# Patient Record
Sex: Male | Born: 1965 | Race: Black or African American | Hispanic: No | Marital: Single | Smoking: Current some day smoker
Health system: Southern US, Community
[De-identification: ages and names within clinical notes are randomized; demographics above are authoritative.]

## PROBLEM LIST (undated history)

## (undated) DIAGNOSIS — T7840XA Allergy, unspecified, initial encounter: Secondary | ICD-10-CM

## (undated) DIAGNOSIS — F1010 Alcohol abuse, uncomplicated: Secondary | ICD-10-CM

## (undated) DIAGNOSIS — W3400XA Accidental discharge from unspecified firearms or gun, initial encounter: Secondary | ICD-10-CM

## (undated) DIAGNOSIS — M795 Residual foreign body in soft tissue: Secondary | ICD-10-CM

## (undated) DIAGNOSIS — K861 Other chronic pancreatitis: Secondary | ICD-10-CM

## (undated) DIAGNOSIS — Z5189 Encounter for other specified aftercare: Secondary | ICD-10-CM

## (undated) HISTORY — DX: Other chronic pancreatitis: K86.1

## (undated) HISTORY — DX: Alcohol abuse, uncomplicated: F10.10

## (undated) HISTORY — DX: Encounter for other specified aftercare: Z51.89

## (undated) HISTORY — PX: CHEST TUBE INSERTION: SHX231

## (undated) HISTORY — DX: Allergy, unspecified, initial encounter: T78.40XA

## (undated) HISTORY — DX: Residual foreign body in soft tissue: M79.5

## (undated) HISTORY — PX: OTHER SURGICAL HISTORY: SHX169

---

## 1992-05-29 HISTORY — PX: FRACTURE SURGERY: SHX138

## 2006-06-02 ENCOUNTER — Emergency Department (HOSPITAL_COMMUNITY): Admission: EM | Admit: 2006-06-02 | Discharge: 2006-06-02 | Payer: Self-pay | Admitting: Emergency Medicine

## 2006-08-21 ENCOUNTER — Emergency Department (HOSPITAL_COMMUNITY): Admission: EM | Admit: 2006-08-21 | Discharge: 2006-08-21 | Payer: Self-pay | Admitting: Emergency Medicine

## 2006-08-23 ENCOUNTER — Ambulatory Visit: Payer: Self-pay | Admitting: Family Medicine

## 2006-08-23 DIAGNOSIS — M25559 Pain in unspecified hip: Secondary | ICD-10-CM

## 2006-08-23 DIAGNOSIS — F17200 Nicotine dependence, unspecified, uncomplicated: Secondary | ICD-10-CM

## 2006-12-01 ENCOUNTER — Emergency Department (HOSPITAL_COMMUNITY): Admission: EM | Admit: 2006-12-01 | Discharge: 2006-12-01 | Payer: Self-pay | Admitting: Emergency Medicine

## 2007-05-01 ENCOUNTER — Ambulatory Visit: Payer: Self-pay | Admitting: Family Medicine

## 2007-05-01 ENCOUNTER — Encounter (INDEPENDENT_AMBULATORY_CARE_PROVIDER_SITE_OTHER): Payer: Self-pay | Admitting: *Deleted

## 2007-05-01 LAB — CONVERTED CEMR LAB
ALT: 30 units/L (ref 0–53)
AST: 25 units/L (ref 0–37)
BUN: 13 mg/dL (ref 6–23)
Cholesterol: 191 mg/dL (ref 0–200)
Creatinine, Ser: 0.98 mg/dL (ref 0.40–1.50)
HDL: 46 mg/dL (ref >39)
Total Bilirubin: 0.8 mg/dL (ref 0.3–1.2)

## 2007-05-31 ENCOUNTER — Ambulatory Visit: Payer: Self-pay | Admitting: Family Medicine

## 2007-05-31 ENCOUNTER — Encounter (INDEPENDENT_AMBULATORY_CARE_PROVIDER_SITE_OTHER): Payer: Self-pay | Admitting: *Deleted

## 2007-06-03 LAB — CONVERTED CEMR LAB
ALT: 47 units/L (ref 0–53)
Albumin: 4.3 g/dL (ref 3.5–5.2)
Alkaline Phosphatase: 41 units/L (ref 39–117)
Basophils Relative: 0 % (ref 0–1)
Glucose, Bld: 91 mg/dL (ref 70–99)
Lipase: 151 units/L — ABNORMAL HIGH (ref 0–75)
Lymphocytes Relative: 44 % (ref 12–46)
Lymphs Abs: 2.6 10*3/uL (ref 0.7–4.0)
MCHC: 32.5 g/dL (ref 30.0–36.0)
Monocytes Absolute: 0.7 10*3/uL (ref 0.1–1.0)
Monocytes Relative: 13 % — ABNORMAL HIGH (ref 3–12)
Neutrophils Relative %: 41 % — ABNORMAL LOW (ref 43–77)
Platelets: 270 10*3/uL (ref 150–400)
RBC: 5.33 M/uL (ref 4.22–5.81)
RDW: 14.4 % (ref 11.5–15.5)
Sodium: 141 meq/L (ref 135–145)
WBC: 5.9 10*3/uL (ref 4.0–10.5)

## 2007-06-05 ENCOUNTER — Telehealth (INDEPENDENT_AMBULATORY_CARE_PROVIDER_SITE_OTHER): Payer: Self-pay | Admitting: *Deleted

## 2007-06-06 ENCOUNTER — Encounter: Admission: RE | Admit: 2007-06-06 | Discharge: 2007-06-06 | Payer: Self-pay | Admitting: *Deleted

## 2007-06-12 ENCOUNTER — Encounter (INDEPENDENT_AMBULATORY_CARE_PROVIDER_SITE_OTHER): Payer: Self-pay | Admitting: *Deleted

## 2007-06-17 ENCOUNTER — Emergency Department (HOSPITAL_COMMUNITY): Admission: EM | Admit: 2007-06-17 | Discharge: 2007-06-17 | Payer: Self-pay | Admitting: Emergency Medicine

## 2007-12-16 ENCOUNTER — Emergency Department (HOSPITAL_COMMUNITY): Admission: EM | Admit: 2007-12-16 | Discharge: 2007-12-17 | Payer: Self-pay | Admitting: Emergency Medicine

## 2008-01-07 ENCOUNTER — Encounter: Payer: Self-pay | Admitting: Family Medicine

## 2008-05-23 ENCOUNTER — Ambulatory Visit: Payer: Self-pay | Admitting: Family Medicine

## 2008-05-23 ENCOUNTER — Inpatient Hospital Stay (HOSPITAL_COMMUNITY): Admission: EM | Admit: 2008-05-23 | Discharge: 2008-05-26 | Payer: Self-pay | Admitting: Emergency Medicine

## 2008-06-29 ENCOUNTER — Ambulatory Visit: Payer: Self-pay | Admitting: Family Medicine

## 2008-06-29 ENCOUNTER — Inpatient Hospital Stay (HOSPITAL_COMMUNITY): Admission: AD | Admit: 2008-06-29 | Discharge: 2008-07-01 | Payer: Self-pay | Admitting: Family Medicine

## 2008-06-29 ENCOUNTER — Encounter: Payer: Self-pay | Admitting: Emergency Medicine

## 2008-07-10 ENCOUNTER — Encounter: Payer: Self-pay | Admitting: *Deleted

## 2009-01-11 ENCOUNTER — Telehealth: Payer: Self-pay | Admitting: *Deleted

## 2009-05-03 ENCOUNTER — Emergency Department (HOSPITAL_COMMUNITY): Admission: EM | Admit: 2009-05-03 | Discharge: 2009-05-03 | Payer: Self-pay | Admitting: Internal Medicine

## 2009-06-29 ENCOUNTER — Ambulatory Visit: Payer: Self-pay | Admitting: Diagnostic Radiology

## 2009-06-29 ENCOUNTER — Emergency Department (HOSPITAL_BASED_OUTPATIENT_CLINIC_OR_DEPARTMENT_OTHER): Admission: EM | Admit: 2009-06-29 | Discharge: 2009-06-29 | Payer: Self-pay | Admitting: Emergency Medicine

## 2009-09-02 ENCOUNTER — Emergency Department (HOSPITAL_COMMUNITY): Admission: EM | Admit: 2009-09-02 | Discharge: 2009-09-02 | Payer: Self-pay | Admitting: Emergency Medicine

## 2009-09-06 ENCOUNTER — Emergency Department (HOSPITAL_COMMUNITY): Admission: EM | Admit: 2009-09-06 | Discharge: 2009-09-06 | Payer: Self-pay | Admitting: Emergency Medicine

## 2009-09-06 LAB — CONVERTED CEMR LAB
Alkaline Phosphatase: 41 units/L
BUN: 19 mg/dL
Chloride: 108 meq/L
Creatinine, Ser: 1.15 mg/dL
Sodium: 140 meq/L

## 2009-09-15 ENCOUNTER — Ambulatory Visit: Payer: Self-pay | Admitting: Family Medicine

## 2009-09-15 DIAGNOSIS — K439 Ventral hernia without obstruction or gangrene: Secondary | ICD-10-CM | POA: Insufficient documentation

## 2009-09-15 DIAGNOSIS — K219 Gastro-esophageal reflux disease without esophagitis: Secondary | ICD-10-CM | POA: Insufficient documentation

## 2009-09-15 DIAGNOSIS — F1910 Other psychoactive substance abuse, uncomplicated: Secondary | ICD-10-CM | POA: Insufficient documentation

## 2009-09-15 LAB — CONVERTED CEMR LAB
Hemoglobin: 13.7 g/dL
WBC: 5.8 10*3/uL

## 2009-09-16 ENCOUNTER — Encounter: Payer: Self-pay | Admitting: *Deleted

## 2010-06-28 NOTE — Letter (Signed)
Summary: Generic Letter  [facility]  [address]   [place], [place] [address]   Phone: [phone]  Fax: [phone]    09/16/2009  [name] [address] [place], [place]  [address]  Dear Mr. SCOGGINS,     I was unable to contact you by phone. Dr. Jeanice Lim has requested we schedule you an appointment with a surgeon. An appointment has been scheduled for Sep 28, 2009 at 9:45 AM with Dr. Carolynne Edouard of [facility]. The address is [address] [place], [place]. Phone number is [phone]. If this time is not convenient please call ther office  to reschedule.        Thank you.         Sincerely,   Theresia Lo RN  Appended Document: Generic Letter letter mailed.
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
## 2010-08-17 LAB — DIFFERENTIAL
Basophils Absolute: 0.1 10*3/uL (ref 0.0–0.1)
Basophils Relative: 0 % (ref 0–1)
Basophils Relative: 2 % — ABNORMAL HIGH (ref 0–1)
Eosinophils Relative: 0 % (ref 0–5)
Eosinophils Relative: 3 % (ref 0–5)
Monocytes Absolute: 0.9 10*3/uL (ref 0.1–1.0)
Monocytes Relative: 16 % — ABNORMAL HIGH (ref 3–12)
Neutro Abs: 2.3 10*3/uL (ref 1.7–7.7)
Neutrophils Relative %: 68 % (ref 43–77)

## 2010-08-17 LAB — CBC
HCT: 41.8 % (ref 39.0–52.0)
Hemoglobin: 14.1 g/dL (ref 13.0–17.0)
Platelets: 209 10*3/uL (ref 150–400)
Platelets: 212 10*3/uL (ref 150–400)
RBC: 4.76 MIL/uL (ref 4.22–5.81)
RDW: 13.9 % (ref 11.5–15.5)
WBC: 5.8 10*3/uL (ref 4.0–10.5)

## 2010-08-17 LAB — COMPREHENSIVE METABOLIC PANEL
ALT: 32 U/L (ref 0–53)
ALT: 39 U/L (ref 0–53)
AST: 30 U/L (ref 0–37)
Albumin: 3.9 g/dL (ref 3.5–5.2)
Alkaline Phosphatase: 41 U/L (ref 39–117)
Alkaline Phosphatase: 43 U/L (ref 39–117)
BUN: 18 mg/dL (ref 6–23)
BUN: 19 mg/dL (ref 6–23)
CO2: 22 mEq/L (ref 19–32)
Chloride: 102 mEq/L (ref 96–112)
Creatinine, Ser: 1.43 mg/dL (ref 0.4–1.5)
GFR calc Af Amer: >60 mL/min (ref >60)
GFR calc non Af Amer: 54 mL/min — ABNORMAL LOW (ref >60)
Glucose, Bld: 52 mg/dL — ABNORMAL LOW (ref 70–99)
Potassium: 3.8 mEq/L (ref 3.5–5.1)
Sodium: 140 mEq/L (ref 135–145)
Total Protein: 7.2 g/dL (ref 6.0–8.3)

## 2010-08-17 LAB — ETHANOL: Alcohol, Ethyl (B): 83 mg/dL — ABNORMAL HIGH (ref 0–10)

## 2010-08-17 LAB — RAPID STREP SCREEN (MED CTR MEBANE ONLY): Streptococcus, Group A Screen (Direct): NEGATIVE

## 2010-08-18 LAB — URINALYSIS, ROUTINE W REFLEX MICROSCOPIC
Leukocytes, UA: NEGATIVE
Nitrite: NEGATIVE
Specific Gravity, Urine: 1.017 (ref 1.005–1.030)
pH: 6 (ref 5.0–8.0)

## 2010-08-18 LAB — LIPASE, BLOOD: Lipase: 42 U/L (ref 23–300)

## 2010-08-18 LAB — CBC
MCHC: 34 g/dL (ref 30.0–36.0)
MCV: 87 fL (ref 78.0–100.0)
RBC: 4.78 MIL/uL (ref 4.22–5.81)

## 2010-08-18 LAB — DIFFERENTIAL
Lymphocytes Relative: 28 % (ref 12–46)
Lymphs Abs: 2.3 10*3/uL (ref 0.7–4.0)
Neutrophils Relative %: 54 % (ref 43–77)

## 2010-08-18 LAB — URINE MICROSCOPIC-ADD ON

## 2010-08-18 LAB — COMPREHENSIVE METABOLIC PANEL
CO2: 32 mEq/L (ref 19–32)
Calcium: 8.7 mg/dL (ref 8.4–10.5)
Creatinine, Ser: 1.2 mg/dL (ref 0.4–1.5)
GFR calc Af Amer: >60 mL/min (ref >60)
GFR calc non Af Amer: >60 mL/min (ref >60)
Glucose, Bld: 111 mg/dL — ABNORMAL HIGH (ref 70–99)

## 2010-09-13 LAB — HEPATIC FUNCTION PANEL
ALT: 26 U/L (ref 0–53)
AST: 39 U/L — ABNORMAL HIGH (ref 0–37)
Albumin: 2.8 g/dL — ABNORMAL LOW (ref 3.5–5.2)
Albumin: 4.1 g/dL (ref 3.5–5.2)
Alkaline Phosphatase: 50 U/L (ref 39–117)
Alkaline Phosphatase: 39 U/L (ref 39–117)
Total Protein: 5.2 g/dL — ABNORMAL LOW (ref 6.0–8.3)
Total Protein: 7.8 g/dL (ref 6.0–8.3)

## 2010-09-13 LAB — COMPREHENSIVE METABOLIC PANEL
Alkaline Phosphatase: 30 U/L — ABNORMAL LOW (ref 39–117)
BUN: 10 mg/dL (ref 6–23)
Calcium: 8 mg/dL — ABNORMAL LOW (ref 8.4–10.5)
Glucose, Bld: 106 mg/dL — ABNORMAL HIGH (ref 70–99)
Potassium: 3.3 mEq/L — ABNORMAL LOW (ref 3.5–5.1)
Total Protein: 5.4 g/dL — ABNORMAL LOW (ref 6.0–8.3)

## 2010-09-13 LAB — BASIC METABOLIC PANEL
BUN: 18 mg/dL (ref 6–23)
Chloride: 105 mEq/L (ref 96–112)
Chloride: 104 mEq/L (ref 96–112)
Creatinine, Ser: 1.17 mg/dL (ref 0.4–1.5)
GFR calc Af Amer: >60 mL/min (ref >60)
GFR calc Af Amer: >60 mL/min (ref >60)
GFR calc non Af Amer: >60 mL/min (ref >60)
Potassium: 3.9 mEq/L (ref 3.5–5.1)
Sodium: 140 mEq/L (ref 135–145)

## 2010-09-13 LAB — LIPASE, BLOOD
Lipase: 23 U/L (ref 11–59)
Lipase: 60 U/L — ABNORMAL HIGH (ref 11–59)

## 2010-09-13 LAB — DIFFERENTIAL
Basophils Absolute: 0 10*3/uL (ref 0.0–0.1)
Basophils Relative: 0 % (ref 0–1)
Eosinophils Relative: 0 % (ref 0–5)
Monocytes Absolute: 0.3 10*3/uL (ref 0.1–1.0)
Monocytes Relative: 3 % (ref 3–12)

## 2010-09-13 LAB — CBC
HCT: 46.4 % (ref 39.0–52.0)
Hemoglobin: 15.6 g/dL (ref 13.0–17.0)
MCHC: 33.5 g/dL (ref 30.0–36.0)
RBC: 5.25 MIL/uL (ref 4.22–5.81)
RDW: 15.1 % (ref 11.5–15.5)
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
## 2010-12-19 ENCOUNTER — Emergency Department (HOSPITAL_COMMUNITY): Payer: Self-pay

## 2010-12-19 ENCOUNTER — Encounter: Payer: Self-pay | Admitting: Family Medicine

## 2010-12-19 ENCOUNTER — Encounter (HOSPITAL_COMMUNITY): Payer: Self-pay | Admitting: Radiology

## 2010-12-19 ENCOUNTER — Inpatient Hospital Stay (HOSPITAL_COMMUNITY)
Admission: EM | Admit: 2010-12-19 | Discharge: 2010-12-21 | DRG: 439 | Disposition: A | Discharge status: Home / Self Care | Payer: Self-pay | Source: Ambulatory Visit | Attending: Family Medicine | Admitting: Family Medicine

## 2010-12-19 DIAGNOSIS — K219 Gastro-esophageal reflux disease without esophagitis: Secondary | ICD-10-CM | POA: Diagnosis present

## 2010-12-19 DIAGNOSIS — M6282 Rhabdomyolysis: Secondary | ICD-10-CM | POA: Diagnosis present

## 2010-12-19 DIAGNOSIS — F1920 Other psychoactive substance dependence, uncomplicated: Secondary | ICD-10-CM

## 2010-12-19 DIAGNOSIS — F17200 Nicotine dependence, unspecified, uncomplicated: Secondary | ICD-10-CM | POA: Diagnosis present

## 2010-12-19 DIAGNOSIS — F1910 Other psychoactive substance abuse, uncomplicated: Secondary | ICD-10-CM | POA: Diagnosis present

## 2010-12-19 DIAGNOSIS — R300 Dysuria: Secondary | ICD-10-CM | POA: Diagnosis present

## 2010-12-19 DIAGNOSIS — K859 Acute pancreatitis without necrosis or infection, unspecified: Secondary | ICD-10-CM

## 2010-12-19 MED ORDER — IOHEXOL 300 MG/ML  SOLN
100.0000 mL | Freq: Once | INTRAMUSCULAR | Status: AC | PRN
  Administered 2010-12-19: 100 mL via INTRAVENOUS
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
## 2010-12-20 ENCOUNTER — Encounter: Payer: Self-pay | Admitting: Family Medicine

## 2010-12-20 LAB — CBC
HCT: 40.1 % (ref 39.0–52.0)
Hemoglobin: 13.9 g/dL (ref 13.0–17.0)
Hemoglobin: 11.4 g/dL — ABNORMAL LOW (ref 13.0–17.0)
MCH: 30.1 pg (ref 26.0–34.0)
MCH: 29.6 pg (ref 26.0–34.0)
MCHC: 34.7 g/dL (ref 30.0–36.0)
MCHC: 34.1 g/dL (ref 30.0–36.0)
MCV: 86.8 fL (ref 78.0–100.0)
Platelets: 206 10*3/uL (ref 150–400)
RBC: 3.85 MIL/uL — ABNORMAL LOW (ref 4.22–5.81)
RDW: 14.1 % (ref 11.5–15.5)

## 2010-12-20 LAB — CARDIAC PANEL(CRET KIN+CKTOT+MB+TROPI)
CK, MB: 4.4 ng/mL — ABNORMAL HIGH (ref 0.3–4.0)
Troponin I: <0.3 ng/mL (ref <0.30)

## 2010-12-20 LAB — BASIC METABOLIC PANEL
CO2: 25 mEq/L (ref 19–32)
Calcium: 8.1 mg/dL — ABNORMAL LOW (ref 8.4–10.5)
Creatinine, Ser: 1.04 mg/dL (ref 0.50–1.35)
GFR calc non Af Amer: >60 mL/min (ref >60)
Glucose, Bld: 114 mg/dL — ABNORMAL HIGH (ref 70–99)
Sodium: 140 mEq/L (ref 135–145)

## 2010-12-20 LAB — COMPREHENSIVE METABOLIC PANEL
AST: 22 U/L (ref 0–37)
BUN: 16 mg/dL (ref 6–23)
CO2: 27 mEq/L (ref 19–32)
Calcium: 9 mg/dL (ref 8.4–10.5)
Chloride: 105 mEq/L (ref 96–112)
Creatinine, Ser: 1.06 mg/dL (ref 0.50–1.35)
GFR calc Af Amer: >60 mL/min (ref >60)
GFR calc non Af Amer: >60 mL/min (ref >60)
Glucose, Bld: 81 mg/dL (ref 70–99)
Total Bilirubin: 0.5 mg/dL (ref 0.3–1.2)

## 2010-12-20 LAB — DIFFERENTIAL
Basophils Absolute: 0.1 10*3/uL (ref 0.0–0.1)
Basophils Relative: 1 % (ref 0–1)
Eosinophils Relative: 2 % (ref 0–5)
Lymphocytes Relative: 20 % (ref 12–46)
Monocytes Absolute: 0.6 10*3/uL (ref 0.1–1.0)
Monocytes Relative: 7 % (ref 3–12)

## 2010-12-20 LAB — URINALYSIS, ROUTINE W REFLEX MICROSCOPIC
Ketones, ur: NEGATIVE mg/dL
Leukocytes, UA: NEGATIVE
Protein, ur: NEGATIVE mg/dL
Urobilinogen, UA: 1 mg/dL (ref 0.0–1.0)

## 2010-12-20 LAB — PROTIME-INR
INR: 0.89 (ref 0.00–1.49)
Prothrombin Time: 12.2 seconds (ref 11.6–15.2)

## 2010-12-20 LAB — RAPID URINE DRUG SCREEN, HOSP PERFORMED
Amphetamines: NOT DETECTED
Barbiturates: NOT DETECTED
Barbiturates: NOT DETECTED
Cocaine: POSITIVE — AB
Opiates: NOT DETECTED
Tetrahydrocannabinol: NOT DETECTED
Tetrahydrocannabinol: NOT DETECTED

## 2010-12-20 LAB — CK TOTAL AND CKMB (NOT AT ARMC): Total CK: 541 U/L — ABNORMAL HIGH (ref 7–232)

## 2010-12-20 LAB — LACTIC ACID, PLASMA: Lactic Acid, Venous: 0.7 mmol/L (ref 0.5–2.2)

## 2010-12-20 LAB — TROPONIN I: Troponin I: <0.3 ng/mL (ref <0.30)

## 2010-12-20 LAB — LIPASE, BLOOD: Lipase: 164 U/L — ABNORMAL HIGH (ref 11–59)
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
## 2010-12-21 LAB — CBC
HCT: 34.8 % — ABNORMAL LOW (ref 39.0–52.0)
Hemoglobin: 11.7 g/dL — ABNORMAL LOW (ref 13.0–17.0)
RBC: 4.04 MIL/uL — ABNORMAL LOW (ref 4.22–5.81)

## 2010-12-23 ENCOUNTER — Inpatient Hospital Stay: Payer: Self-pay | Admitting: Family Medicine
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
## 2011-02-16 LAB — URINALYSIS, ROUTINE W REFLEX MICROSCOPIC
Bilirubin Urine: NEGATIVE
Ketones, ur: NEGATIVE
Nitrite: NEGATIVE
Specific Gravity, Urine: 1.024
Urobilinogen, UA: 2 — ABNORMAL HIGH

## 2011-02-16 LAB — CBC
HCT: 41.6
Hemoglobin: 13.9
MCHC: 33.4
MCV: 87.1
RBC: 4.78
WBC: 7.8

## 2011-02-16 LAB — COMPREHENSIVE METABOLIC PANEL
AST: 42 — ABNORMAL HIGH
BUN: 13
CO2: 28
Calcium: 9.3
Chloride: 107
Creatinine, Ser: 1.2
GFR calc non Af Amer: >60
Glucose, Bld: 81
Total Bilirubin: 1.3 — ABNORMAL HIGH

## 2011-02-16 LAB — LIPASE, BLOOD: Lipase: 25

## 2011-02-16 LAB — DIFFERENTIAL
Basophils Absolute: 0.1
Eosinophils Relative: 2
Lymphocytes Relative: 31
Neutrophils Relative %: 56

## 2011-03-02 ENCOUNTER — Encounter: Payer: Self-pay | Admitting: Family Medicine

## 2011-03-03 LAB — COCAINE, URINE, CONFIRMATION: Benzoylecgonine GC/MS Conf: 290 ng/mL

## 2011-03-03 LAB — BASIC METABOLIC PANEL
BUN: 4 mg/dL — ABNORMAL LOW (ref 6–23)
Calcium: 8.6 mg/dL (ref 8.4–10.5)
Creatinine, Ser: 1.12 mg/dL (ref 0.4–1.5)
GFR calc non Af Amer: >60 mL/min (ref >60)
Glucose, Bld: 94 mg/dL (ref 70–99)

## 2011-03-03 LAB — COMPREHENSIVE METABOLIC PANEL
ALT: 15 U/L (ref 0–53)
AST: 24 U/L (ref 0–37)
AST: 31 U/L (ref 0–37)
Albumin: 2.9 g/dL — ABNORMAL LOW (ref 3.5–5.2)
Albumin: 3.7 g/dL (ref 3.5–5.2)
Alkaline Phosphatase: 34 U/L — ABNORMAL LOW (ref 39–117)
CO2: 25 mEq/L (ref 19–32)
Calcium: 8.1 mg/dL — ABNORMAL LOW (ref 8.4–10.5)
Chloride: 104 mEq/L (ref 96–112)
Chloride: 106 mEq/L (ref 96–112)
Creatinine, Ser: 1.15 mg/dL (ref 0.4–1.5)
GFR calc Af Amer: >60 mL/min (ref >60)
GFR calc Af Amer: >60 mL/min (ref >60)
Potassium: 4.3 mEq/L (ref 3.5–5.1)
Sodium: 138 mEq/L (ref 135–145)
Sodium: 139 mEq/L (ref 135–145)
Total Bilirubin: 1.3 mg/dL — ABNORMAL HIGH (ref 0.3–1.2)

## 2011-03-03 LAB — CBC
HCT: 37.3 % — ABNORMAL LOW (ref 39.0–52.0)
MCHC: 33.6 g/dL (ref 30.0–36.0)
MCV: 88.3 fL (ref 78.0–100.0)
Platelets: 263 10*3/uL (ref 150–400)
Platelets: 269 10*3/uL (ref 150–400)
Platelets: 325 10*3/uL (ref 150–400)
RBC: 4.22 MIL/uL (ref 4.22–5.81)
RDW: 13.9 % (ref 11.5–15.5)
WBC: 7.4 10*3/uL (ref 4.0–10.5)
WBC: 8.2 10*3/uL (ref 4.0–10.5)
WBC: 9.1 10*3/uL (ref 4.0–10.5)

## 2011-03-03 LAB — ETHANOL: Alcohol, Ethyl (B): <5 mg/dL (ref 0–10)

## 2011-03-03 LAB — URINE CULTURE: Colony Count: 1000

## 2011-03-03 LAB — POCT I-STAT, CHEM 8
BUN: 15 mg/dL (ref 6–23)
Calcium, Ion: 1.12 mmol/L (ref 1.12–1.32)
Hemoglobin: 16 g/dL (ref 13.0–17.0)
Sodium: 141 mEq/L (ref 135–145)
TCO2: 27 mmol/L (ref 0–100)

## 2011-03-03 LAB — DIFFERENTIAL
Basophils Absolute: 0 10*3/uL (ref 0.0–0.1)
Basophils Relative: 1 % (ref 0–1)
Eosinophils Relative: 1 % (ref 0–5)
Lymphocytes Relative: 24 % (ref 12–46)
Monocytes Absolute: 1 10*3/uL (ref 0.1–1.0)

## 2011-03-03 LAB — URINALYSIS, ROUTINE W REFLEX MICROSCOPIC
Bilirubin Urine: NEGATIVE
Hgb urine dipstick: NEGATIVE
Protein, ur: 30 mg/dL — AB
Urobilinogen, UA: 1 mg/dL (ref 0.0–1.0)

## 2011-03-03 LAB — LIPASE, BLOOD
Lipase: 116 U/L — ABNORMAL HIGH (ref 11–59)
Lipase: 32 U/L (ref 11–59)

## 2011-03-03 LAB — URINE DRUGS OF ABUSE SCREEN W ALC, ROUTINE (REF LAB)
Benzodiazepines.: NEGATIVE
Ethyl Alcohol: <10 mg/dL (ref <10)
Methadone: NEGATIVE
Opiate Screen, Urine: NEGATIVE
Phencyclidine (PCP): NEGATIVE
Propoxyphene: NEGATIVE

## 2011-03-03 LAB — LIPID PANEL
LDL Cholesterol: 90 mg/dL (ref 0–99)
Total CHOL/HDL Ratio: 3.3 RATIO
Triglycerides: 89 mg/dL (ref <150)
VLDL: 18 mg/dL (ref 0–40)

## 2011-03-03 LAB — PROTIME-INR: INR: 1 (ref 0.00–1.49)

## 2011-03-29 ENCOUNTER — Encounter: Payer: Self-pay | Admitting: Family Medicine

## 2011-03-29 ENCOUNTER — Ambulatory Visit (INDEPENDENT_AMBULATORY_CARE_PROVIDER_SITE_OTHER): Payer: Medicaid Other | Admitting: Family Medicine

## 2011-03-29 VITALS — BP 111/68 | HR 77 | Temp 98.8°F | Ht 67.5 in | Wt 142.0 lb

## 2011-03-29 DIAGNOSIS — K439 Ventral hernia without obstruction or gangrene: Secondary | ICD-10-CM

## 2011-03-29 DIAGNOSIS — Z23 Encounter for immunization: Secondary | ICD-10-CM

## 2011-03-29 DIAGNOSIS — Z0000 Encounter for general adult medical examination without abnormal findings: Secondary | ICD-10-CM | POA: Insufficient documentation

## 2011-03-29 DIAGNOSIS — K0889 Other specified disorders of teeth and supporting structures: Secondary | ICD-10-CM

## 2011-03-29 DIAGNOSIS — K089 Disorder of teeth and supporting structures, unspecified: Secondary | ICD-10-CM

## 2011-03-29 MED ORDER — PENICILLIN V POTASSIUM 250 MG PO TABS: 250.0000 mg | ORAL_TABLET | Freq: Four times a day (QID) | ORAL | Status: DC

## 2011-03-29 NOTE — Assessment & Plan Note (Addendum)
Due to dental cavity. Gave information on [employer] [facility] and will contact social worker for information on dentist in the area that except Medicaid. Started penicillin for inflammation reduction. I feel is inappropriate for prescription pain medications as he has a significant history for cocaine abuse and would not be safe with this medication Followup in one month.

## 2011-03-29 NOTE — Progress Notes (Signed)
Mr. Jilda Roche presents to clinic today for a CPE. He also notes that he has left upper jaw tooth pain, with a dental caries.  1) health maintenance: Reviewed. Flu shot due today, tetanus given in July last hospitalization. 2) history of substance abuse: Intermittent cocaine user and continued tobacco user. He is interested in quitting smoking. 3) past surgical and past medical history is reviewed. 4) disability paperwork initiated  ROS as above otherwise neg Medications reviewed.  Exam:  BP 111/68  Pulse 77  Temp 98.8 F (37.1 C)  Ht 5' 7.5" (1.715 m)  Wt 142 lb (64.411 kg)  BMI 21.91 kg/m2 Gen: Well NAD HEENT: EOMI,  MMM, dental carie in upper left rear molar Lungs: CTABL Nl WOB Heart: RRR no MRG Abd: NABS, NT, ND, multiple scars on abdomen small reducible ventral hernia. Exts: Non edematous BL  LE, warm and well perfused.

## 2011-03-29 NOTE — Assessment & Plan Note (Signed)
Recommended following up with his general surgeon. He may benefit from a revision.

## 2011-03-29 NOTE — Assessment & Plan Note (Signed)
As noted above. Multiple issues addressed and reviewed. Tooth pain and ventral hernia discussed in separate issues. Plan follow up in one month. Offered counseling for substance abuse and will follow up with dedicated tobacco abuse cessation visit.Marland Kitchen

## 2011-03-29 NOTE — Patient Instructions (Addendum)
Thank you for coming in today. Please call [facility] at [phone] to get the details to get your tooth pulled.  My social worker will also call you about dentists that take medicaid in town. Make an appointment with your surgeon to take a look at your belly.  Take the penicillin 4x daily to reduce the inflammation around your tooth.  See me in 1 month.

## 2011-03-31 ENCOUNTER — Telehealth: Payer: Self-pay | Admitting: Clinical

## 2011-03-31 NOTE — Telephone Encounter (Signed)
Social Worker received referral to assist pt. In finding a Medicaid Dentist. Social Worker contacted the number listed in the system however pt. Mother answered the phone and stated she was not with pt. Social Worker briefly informed who she was and what the referral was for. The pt. Mother stated she would assist patient in finding a Medicaid Dentist and would call Social Worker if additional resources were needed.

## 2011-04-29 ENCOUNTER — Emergency Department (HOSPITAL_COMMUNITY): Payer: Medicaid Other

## 2011-04-29 ENCOUNTER — Encounter (HOSPITAL_COMMUNITY): Payer: Self-pay | Admitting: Emergency Medicine

## 2011-04-29 ENCOUNTER — Observation Stay (HOSPITAL_COMMUNITY)
Admission: EM | Admit: 2011-04-29 | Discharge: 2011-05-02 | Disposition: A | Discharge status: Home / Self Care | Payer: Medicaid Other | Source: Ambulatory Visit | Attending: Family Medicine | Admitting: Family Medicine

## 2011-04-29 DIAGNOSIS — R112 Nausea with vomiting, unspecified: Secondary | ICD-10-CM | POA: Diagnosis present

## 2011-04-29 DIAGNOSIS — R109 Unspecified abdominal pain: Secondary | ICD-10-CM | POA: Insufficient documentation

## 2011-04-29 DIAGNOSIS — K861 Other chronic pancreatitis: Secondary | ICD-10-CM | POA: Insufficient documentation

## 2011-04-29 DIAGNOSIS — K219 Gastro-esophageal reflux disease without esophagitis: Secondary | ICD-10-CM | POA: Diagnosis present

## 2011-04-29 DIAGNOSIS — R071 Chest pain on breathing: Secondary | ICD-10-CM | POA: Insufficient documentation

## 2011-04-29 DIAGNOSIS — G8929 Other chronic pain: Secondary | ICD-10-CM

## 2011-04-29 DIAGNOSIS — F1910 Other psychoactive substance abuse, uncomplicated: Secondary | ICD-10-CM

## 2011-04-29 DIAGNOSIS — K921 Melena: Secondary | ICD-10-CM | POA: Insufficient documentation

## 2011-04-29 DIAGNOSIS — J111 Influenza due to unidentified influenza virus with other respiratory manifestations: Principal | ICD-10-CM | POA: Insufficient documentation

## 2011-04-29 DIAGNOSIS — Z96649 Presence of unspecified artificial hip joint: Secondary | ICD-10-CM | POA: Insufficient documentation

## 2011-04-29 DIAGNOSIS — K439 Ventral hernia without obstruction or gangrene: Secondary | ICD-10-CM | POA: Insufficient documentation

## 2011-04-29 DIAGNOSIS — K920 Hematemesis: Secondary | ICD-10-CM | POA: Insufficient documentation

## 2011-04-29 DIAGNOSIS — R51 Headache: Secondary | ICD-10-CM | POA: Insufficient documentation

## 2011-04-29 DIAGNOSIS — R509 Fever, unspecified: Secondary | ICD-10-CM

## 2011-04-29 DIAGNOSIS — K469 Unspecified abdominal hernia without obstruction or gangrene: Secondary | ICD-10-CM

## 2011-04-29 DIAGNOSIS — R05 Cough: Secondary | ICD-10-CM | POA: Diagnosis present

## 2011-04-29 DIAGNOSIS — M25559 Pain in unspecified hip: Secondary | ICD-10-CM | POA: Diagnosis present

## 2011-04-29 LAB — CBC
MCV: 87.9 fL (ref 78.0–100.0)
Platelets: 283 10*3/uL (ref 150–400)
RDW: 13.6 % (ref 11.5–15.5)
WBC: 7.6 10*3/uL (ref 4.0–10.5)

## 2011-04-29 LAB — COMPREHENSIVE METABOLIC PANEL
ALT: 16 U/L (ref 0–53)
AST: 32 U/L (ref 0–37)
CO2: 25 mEq/L (ref 19–32)
Calcium: 9.4 mg/dL (ref 8.4–10.5)
Potassium: 4 mEq/L (ref 3.5–5.1)
Sodium: 135 mEq/L (ref 135–145)
Total Protein: 7.6 g/dL (ref 6.0–8.3)

## 2011-04-29 LAB — URINE MICROSCOPIC-ADD ON

## 2011-04-29 LAB — DIFFERENTIAL
Basophils Absolute: 0 10*3/uL (ref 0.0–0.1)
Eosinophils Absolute: 0 10*3/uL (ref 0.0–0.7)
Eosinophils Relative: 0 % (ref 0–5)
Lymphocytes Relative: 11 % — ABNORMAL LOW (ref 12–46)
Neutrophils Relative %: 75 % (ref 43–77)

## 2011-04-29 LAB — PROTIME-INR
INR: 1.01 (ref 0.00–1.49)
Prothrombin Time: 13.5 seconds (ref 11.6–15.2)

## 2011-04-29 LAB — URINALYSIS, ROUTINE W REFLEX MICROSCOPIC
Glucose, UA: NEGATIVE mg/dL
Specific Gravity, Urine: 1.029 (ref 1.005–1.030)

## 2011-04-29 LAB — LACTIC ACID, PLASMA: Lactic Acid, Venous: 1.3 mmol/L (ref 0.5–2.2)

## 2011-04-29 LAB — ETHANOL: Alcohol, Ethyl (B): <11 mg/dL (ref 0–11)

## 2011-04-29 LAB — APTT: aPTT: 29 seconds (ref 24–37)

## 2011-04-29 MED ORDER — HYDROMORPHONE HCL PF 1 MG/ML IJ SOLN
1.0000 mg | Freq: Once | INTRAMUSCULAR | Status: AC
  Administered 2011-04-29: 1 mg via INTRAVENOUS
  Filled 2011-04-29: qty 1

## 2011-04-29 MED ORDER — SODIUM CHLORIDE 0.9 % IV BOLUS (SEPSIS)
1000.0000 mL | Freq: Once | INTRAVENOUS | Status: AC
  Administered 2011-04-29: 1000 mL via INTRAVENOUS

## 2011-04-29 MED ORDER — ONDANSETRON HCL 4 MG/2ML IJ SOLN
4.0000 mg | Freq: Once | INTRAMUSCULAR | Status: AC
  Administered 2011-04-29: 4 mg via INTRAVENOUS
  Filled 2011-04-29: qty 2

## 2011-04-29 MED ORDER — ACETAMINOPHEN 325 MG PO TABS
650.0000 mg | ORAL_TABLET | Freq: Once | ORAL | Status: AC
  Administered 2011-04-29 – 2011-04-30 (×2): 650 mg via ORAL
  Filled 2011-04-29: qty 2

## 2011-04-29 MED ORDER — IOHEXOL 300 MG/ML  SOLN: 100.0000 mL | Freq: Once | INTRAMUSCULAR | Status: AC | PRN

## 2011-04-29 NOTE — ED Notes (Signed)
Pt reports abdominal pain with N/V onset yesterday. Pt reports vomiting dark red blood.
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
## 2011-04-29 NOTE — ED Notes (Signed)
Pt states he can't void urinal at bedside

## 2011-04-30 ENCOUNTER — Encounter (HOSPITAL_COMMUNITY): Payer: Self-pay | Admitting: General Practice

## 2011-04-30 ENCOUNTER — Emergency Department (HOSPITAL_COMMUNITY): Payer: Medicaid Other

## 2011-04-30 DIAGNOSIS — R05 Cough: Secondary | ICD-10-CM | POA: Diagnosis present

## 2011-04-30 DIAGNOSIS — R112 Nausea with vomiting, unspecified: Secondary | ICD-10-CM | POA: Diagnosis present

## 2011-04-30 LAB — BASIC METABOLIC PANEL
BUN: 11 mg/dL (ref 6–23)
CO2: 25 mEq/L (ref 19–32)
Calcium: 7.9 mg/dL — ABNORMAL LOW (ref 8.4–10.5)
GFR calc non Af Amer: 67 mL/min — ABNORMAL LOW (ref >90)
Glucose, Bld: 97 mg/dL (ref 70–99)
Sodium: 132 mEq/L — ABNORMAL LOW (ref 135–145)

## 2011-04-30 LAB — INFLUENZA PANEL BY PCR (TYPE A & B)
H1N1 flu by pcr: NOT DETECTED
Influenza B By PCR: NEGATIVE

## 2011-04-30 LAB — CBC
HCT: 35.9 % — ABNORMAL LOW (ref 39.0–52.0)
Hemoglobin: 12.1 g/dL — ABNORMAL LOW (ref 13.0–17.0)
MCH: 29.4 pg (ref 26.0–34.0)
MCHC: 33.7 g/dL (ref 30.0–36.0)
RBC: 4.11 MIL/uL — ABNORMAL LOW (ref 4.22–5.81)

## 2011-04-30 LAB — RAPID URINE DRUG SCREEN, HOSP PERFORMED
Cocaine: POSITIVE — AB
Opiates: NOT DETECTED

## 2011-04-30 MED ORDER — GUAIFENESIN ER 600 MG PO TB12
600.0000 mg | ORAL_TABLET | Freq: Two times a day (BID) | ORAL | Status: DC
  Administered 2011-04-30 – 2011-05-02 (×5): 600 mg via ORAL
  Filled 2011-04-30 (×7): qty 1

## 2011-04-30 MED ORDER — HYDROMORPHONE HCL PF 1 MG/ML IJ SOLN
1.0000 mg | Freq: Once | INTRAMUSCULAR | Status: AC
  Administered 2011-04-30: 1 mg via INTRAVENOUS
  Filled 2011-04-30: qty 1

## 2011-04-30 MED ORDER — SODIUM CHLORIDE 0.9 % IJ SOLN
3.0000 mL | Freq: Two times a day (BID) | INTRAMUSCULAR | Status: DC
  Administered 2011-04-30 – 2011-05-02 (×4): 3 mL via INTRAVENOUS

## 2011-04-30 MED ORDER — PANTOPRAZOLE SODIUM 40 MG IV SOLR
40.0000 mg | Freq: Every day | INTRAVENOUS | Status: DC
  Administered 2011-04-30: 40 mg via INTRAVENOUS
  Filled 2011-04-30 (×2): qty 40

## 2011-04-30 MED ORDER — HYDROMORPHONE HCL PF 1 MG/ML IJ SOLN
1.0000 mg | INTRAMUSCULAR | Status: DC | PRN
  Administered 2011-04-30 (×2): 1 mg via INTRAVENOUS
  Filled 2011-04-30 (×2): qty 1

## 2011-04-30 MED ORDER — ONDANSETRON HCL 4 MG PO TABS: 4.0000 mg | ORAL_TABLET | Freq: Four times a day (QID) | ORAL | Status: DC | PRN

## 2011-04-30 MED ORDER — ACETAMINOPHEN 650 MG RE SUPP: 650.0000 mg | Freq: Four times a day (QID) | RECTAL | Status: DC | PRN

## 2011-04-30 MED ORDER — ACETAMINOPHEN 325 MG PO TABS
ORAL_TABLET | ORAL | Status: AC
  Administered 2011-04-30: 650 mg via ORAL
  Filled 2011-04-30: qty 2

## 2011-04-30 MED ORDER — OSELTAMIVIR PHOSPHATE 75 MG PO CAPS
75.0000 mg | ORAL_CAPSULE | Freq: Two times a day (BID) | ORAL | Status: DC
  Administered 2011-04-30 – 2011-05-02 (×5): 75 mg via ORAL
  Filled 2011-04-30 (×7): qty 1

## 2011-04-30 MED ORDER — IOHEXOL 300 MG/ML  SOLN: 100.0000 mL | Freq: Once | INTRAMUSCULAR | Status: AC | PRN

## 2011-04-30 MED ORDER — SODIUM CHLORIDE 0.45 % IV SOLN
INTRAVENOUS | Status: DC
  Administered 2011-04-30 – 2011-05-01 (×4): via INTRAVENOUS

## 2011-04-30 MED ORDER — HYDROCODONE-ACETAMINOPHEN 5-325 MG PO TABS
1.0000 | ORAL_TABLET | ORAL | Status: DC | PRN
  Administered 2011-04-30 – 2011-05-02 (×7): 2 via ORAL
  Filled 2011-04-30 (×7): qty 2

## 2011-04-30 MED ORDER — BENZONATATE 100 MG PO CAPS
100.0000 mg | ORAL_CAPSULE | Freq: Three times a day (TID) | ORAL | Status: DC | PRN
  Administered 2011-04-30 – 2011-05-01 (×2): 100 mg via ORAL
  Filled 2011-04-30: qty 1

## 2011-04-30 MED ORDER — ONDANSETRON HCL 4 MG PO TABS: 4.0000 mg | ORAL_TABLET | Freq: Four times a day (QID) | ORAL | Status: DC

## 2011-04-30 MED ORDER — IOHEXOL 300 MG/ML  SOLN
100.0000 mL | Freq: Once | INTRAMUSCULAR | Status: AC | PRN
  Administered 2011-04-30: 100 mL via INTRAVENOUS

## 2011-04-30 MED ORDER — ACETAMINOPHEN 325 MG PO TABS: 650.0000 mg | ORAL_TABLET | Freq: Four times a day (QID) | ORAL | Status: DC | PRN

## 2011-04-30 MED ORDER — OXYCODONE-ACETAMINOPHEN 5-325 MG PO TABS: 2.0000 | ORAL_TABLET | ORAL | Status: DC | PRN

## 2011-04-30 MED ORDER — SODIUM CHLORIDE 0.9 % IV SOLN: 250.0000 mL | INTRAVENOUS | Status: DC | PRN

## 2011-04-30 MED ORDER — HEPARIN SODIUM (PORCINE) 5000 UNIT/ML IJ SOLN
5000.0000 [IU] | Freq: Three times a day (TID) | INTRAMUSCULAR | Status: DC
  Administered 2011-04-30 – 2011-05-02 (×8): 5000 [IU] via SUBCUTANEOUS
  Filled 2011-04-30 (×11): qty 1

## 2011-04-30 MED ORDER — ONDANSETRON HCL 4 MG/2ML IJ SOLN
4.0000 mg | Freq: Once | INTRAMUSCULAR | Status: AC
  Administered 2011-04-30: 4 mg via INTRAVENOUS
  Filled 2011-04-30: qty 2

## 2011-04-30 MED ORDER — ONDANSETRON HCL 4 MG/2ML IJ SOLN: 4.0000 mg | Freq: Four times a day (QID) | INTRAMUSCULAR | Status: DC | PRN

## 2011-04-30 MED ORDER — SODIUM CHLORIDE 0.9 % IJ SOLN: 3.0000 mL | INTRAMUSCULAR | Status: DC | PRN

## 2011-04-30 NOTE — Progress Notes (Signed)
[facility] Attending Daily Note: Jisselle Poth MD [phone] pager office [phone] I  have seen and examined this patient, reviewed their chart. I have discussed this patient with the resident. I agree with the resident's findings, assessment and care plan. 

## 2011-04-30 NOTE — Progress Notes (Signed)
MD, Please call mother Dorene Grebe after assessing patient for update on condition. Cell is [phone], office is [phone]. Peter Congo RN

## 2011-04-30 NOTE — ED Provider Notes (Signed)
Medical screening examination/treatment/procedure(s) were performed by non-physician practitioner and as supervising physician I was immediately available for consultation/collaboration.   Glynn Octave, MD 04/30/11 1011

## 2011-04-30 NOTE — Progress Notes (Signed)
Patient is requesting a prescription for a nicotine patch at discharge. Peter Congo RN
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
## 2011-04-30 NOTE — H&P (Signed)
[facility] Attending Admission Note: Mkayla Steele MD [phone] pager office [phone] I  have seen and examined this patient, reviewed their chart. I have discussed this patient with the resident. I agree with the resident's findings, assessment and care plan. 
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
## 2011-05-01 MED ORDER — PANTOPRAZOLE SODIUM 40 MG PO TBEC
40.0000 mg | DELAYED_RELEASE_TABLET | Freq: Every day | ORAL | Status: DC
  Administered 2011-05-01 – 2011-05-02 (×2): 40 mg via ORAL
  Filled 2011-05-01 (×3): qty 1

## 2011-05-01 MED ORDER — PANTOPRAZOLE SODIUM 40 MG PO TBEC: 40.0000 mg | DELAYED_RELEASE_TABLET | Freq: Every day | ORAL | Status: DC

## 2011-05-01 MED ORDER — OSELTAMIVIR PHOSPHATE 75 MG PO CAPS: 75.0000 mg | ORAL_CAPSULE | Freq: Two times a day (BID) | ORAL | Status: DC

## 2011-05-01 MED ORDER — ENSURE CLINICAL ST REVIGOR PO LIQD
237.0000 mL | Freq: Three times a day (TID) | ORAL | Status: DC
  Administered 2011-05-01 – 2011-05-02 (×3): 237 mL via ORAL

## 2011-05-01 MED ORDER — BENZONATATE 100 MG PO CAPS: 100.0000 mg | ORAL_CAPSULE | Freq: Three times a day (TID) | ORAL | Status: AC | PRN

## 2011-05-01 NOTE — Progress Notes (Signed)
INITIAL ADULT NUTRITION ASSESSMENT Date: 05/01/2011   Time: 12:04 PM  Reason for Assessment: Consult  ASSESSMENT: Male 45 y.o.  Dx: Productive cough  Hx:  Past Medical History  Diagnosis Date  . Pancreatitis chronic     Related Meds:     . guaiFENesin  600 mg Oral BID  . heparin  5,000 Units Subcutaneous Q8H  . oseltamivir  75 mg Oral BID  . pantoprazole (PROTONIX) IV  40 mg Intravenous QHS  . sodium chloride  3 mL Intravenous Q12H    Ht: 5\' 7"  (170.2 cm)  Wt: 135 lb (61.236 kg)  Ideal Wt: 67.2 kg % Ideal Wt: 91%  Usual Wt: 68 kg % Usual Wt: 89%  Body mass index is 21.14 kg/(m^2).  Food/Nutrition Related Hx: unintentional weight loss greater than 10 lbs within the last month per nutrition screen  Labs:  BMET    Component Value Date/Time   NA 132* 04/30/2011 0926   K 3.4* 04/30/2011 0926   CL 101 04/30/2011 0926   CO2 25 04/30/2011 0926   GLUCOSE 97 04/30/2011 0926   BUN 11 04/30/2011 0926   CREATININE 1.27 04/30/2011 0926   CALCIUM 7.9* 04/30/2011 0926   GFRNONAA 67* 04/30/2011 0926   GFRAA 78* 04/30/2011 0926    I/O last 3 completed shifts: In: 2468.2 [P.O.:729; I.V.:1729.2; IV Piggyback:10] Out: 1 [Urine:1]     Diet Order: Regular  Supplements/Tube Feeding: N/A  IVF:    sodium chloride Last Rate: 125 mL/hr at 05/01/11 0533    Estimated Nutritional Needs:   Kcal: 1,800-2,000 Protein: 90-100 g Fluid: 1.8-2.0 L  RD obtained nutrition hx from pt -- reports poor appetite PTA; suspects unintentional weight loss in the past month-- unable to quantify amount, however UBW ~ 150-160 lbs; meets criteria for severe malnutrition; PO intake 70-85% per flowsheet records; denies N/V; pt amenable to Strawberry Ensure supplement -- RD to order.  NUTRITION DIAGNOSIS: -Malnutrition (NI-5.2).  Status: Ongoing  RELATED TO: acute illness  AS EVIDENCE BY: 10% weight loss, < 75% intake of estimated energy requirement for > 1  month  MONITORING/EVALUATION(Goals): Goal: meet >90% of estimated nutrition needs to prevent further weight loss Monitor: PO intake, weight, labs  EDUCATION NEEDS: -No education needs identified at this time  INTERVENTION:  Add Strawberry Ensure Clinical Strength PO TID (350 kcals, 13 gm per 8 fl oz)  RD to follow for nutrition care plan  Dietitian #: [phone]  DOCUMENTATION CODES Per approved criteria  -Severe malnutrition in the context of acute illness or injury    Alger Memos 05/01/2011, 12:04 PM

## 2011-05-01 NOTE — Progress Notes (Signed)
Clinical Child psychotherapist (CSW) completed psychosocial assessment which can be found in pt shadow chart. Pt. States he uses cocaine when he is with his friends, does not believe it will be difficult to quit, reports prior treatment and denies any guilt or concern with drug use. CSW provided pt with substance abuse resources and CSW is signing off. Theresia Bough, MSW, Theresia Majors [phone]
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
## 2011-05-02 MED ORDER — GUAIFENESIN ER 600 MG PO TB12: 600.0000 mg | ORAL_TABLET | Freq: Two times a day (BID) | ORAL | Status: DC

## 2011-05-02 MED ORDER — OSELTAMIVIR PHOSPHATE 75 MG PO CAPS: 75.0000 mg | ORAL_CAPSULE | Freq: Two times a day (BID) | ORAL | Status: AC

## 2011-05-02 NOTE — Discharge Summary (Signed)
I discussed with Dr Konrad Dolores and agree with this discharge note.

## 2011-05-02 NOTE — Progress Notes (Signed)
Family Medicine Teaching Service Attending Note  I discussed patient [name]  with Dr. Konrad Dolores and reviewed their note for today.  I agree with their assessment and plan.     Ok to DC
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
## 2011-05-02 NOTE — Progress Notes (Signed)
Subjective: Patient with minimal periumbilical pain this am. Feels much better this a.m. Patient tolerating regular diet without nausea or vomiting. Patient feels ready to go home. Patient reports significant improvement in cough after use of Tessalon Perles. And overall pulmonary congestion with Mucinex.    Denies - CP, SOB, nausea, vomiting, syncope  Objective: Vital signs in last 24 hours: Temp:  [98.4 F (36.9 C)-98.7 F (37.1 C)] 98.6 F (37 C) (12/04 0500) Pulse Rate:  [63-70] 69  (12/04 0500) Resp:  [18-20] 20  (12/04 0500) BP: (101-111)/(59-76) 101/59 mmHg (12/04 0500) SpO2:  [98 %-99 %] 98 % (12/04 0500) Weight change:  Last BM Date: 04/30/11  Intake/Output from previous day: 12/03 0701 - 12/04 0700 In: 420 [P.O.:420] Out: 700 [Urine:700] Intake/Output this shift:    General appearance: alert, cooperative, appears stated age and no distress Resp: clear to auscultation bilaterally Cardio: regular rate and rhythm, S1, S2 normal, no murmur, click, rub or gallop GI: Soft, non-distended.  Small umblical hernia noted, difficult to reduce. Mild tenderness to palpation noted.  No guarding or rebound.  Surgical scars noted Extremities: extremities normal, atraumatic, no cyanosis or edema  Lab Results: none  Studies/Results: None  Medications:  Scheduled:    . feeding supplement  237 mL Oral TID WC  . guaiFENesin  600 mg Oral BID  . heparin  5,000 Units Subcutaneous Q8H  . oseltamivir  75 mg Oral BID  . pantoprazole  40 mg Oral Q1200  . sodium chloride  3 mL Intravenous Q12H  . DISCONTD: pantoprazole  40 mg Oral Q1200  . DISCONTD: pantoprazole (PROTONIX) IV  40 mg Intravenous QHS    Assessment/Plan:  45 year old male presents with 6 days of productive cough, Flu positive on Tamiflu   1.  Cough and Fever:  Flu positive. On day 3 of tamiflu. CXR on 04/29/11, and CBC reasuring that this is not bacterial pneumonia. Will continue w/ tessalon pearls for cough as these  helped pt significantly  2. Abdominal pain: Minimal this am. Negative CT abd/xrays.  Possibly viral gastroenteritis.   3. FEN/GI: tolerating regular diet.   SLIV  4. Cocaine abuse. The patient denies chest pain outside of coughing. Patient was counseled on dangers of use patient voiced understanding.   5. DVT prophylaxis heparin 5000 units subcutaneous 3 times a day.   6. Disposition: To home pending clinical improvement.    LOS: 3 days   Shelly Flatten, MD 05/02/2011, 7:28 AM Service Pager:  [phone]

## 2011-05-06 LAB — CULTURE, BLOOD (ROUTINE X 2)
Culture  Setup Time: 201212021104
Culture: NO GROWTH

## 2011-05-15 ENCOUNTER — Inpatient Hospital Stay: Payer: Medicaid Other | Admitting: Family Medicine

## 2011-06-22 ENCOUNTER — Encounter (HOSPITAL_COMMUNITY): Payer: Self-pay | Admitting: Emergency Medicine

## 2011-06-22 ENCOUNTER — Emergency Department (INDEPENDENT_AMBULATORY_CARE_PROVIDER_SITE_OTHER)
Admission: EM | Admit: 2011-06-22 | Discharge: 2011-06-22 | Disposition: A | Discharge status: Home / Self Care | Payer: Self-pay | Source: Home / Self Care | Attending: Emergency Medicine | Admitting: Emergency Medicine

## 2011-06-22 DIAGNOSIS — K0889 Other specified disorders of teeth and supporting structures: Secondary | ICD-10-CM

## 2011-06-22 DIAGNOSIS — K089 Disorder of teeth and supporting structures, unspecified: Secondary | ICD-10-CM

## 2011-06-22 HISTORY — DX: Accidental discharge from unspecified firearms or gun, initial encounter: W34.00XA

## 2011-06-22 MED ORDER — HYDROCODONE-ACETAMINOPHEN 5-325 MG PO TABS: ORAL_TABLET | ORAL | Status: AC

## 2011-06-22 MED ORDER — LIDOCAINE VISCOUS 2 % MT SOLN: 10.0000 mL | OROMUCOSAL | Status: AC | PRN

## 2011-06-22 MED ORDER — CHLORHEXIDINE GLUCONATE 0.12 % MT SOLN: OROMUCOSAL | Status: AC

## 2011-06-22 MED ORDER — IBUPROFEN 600 MG PO TABS: 600.0000 mg | ORAL_TABLET | Freq: Four times a day (QID) | ORAL | Status: AC | PRN

## 2011-06-22 MED ORDER — LIDOCAINE VISCOUS 2 % MT SOLN
20.0000 mL | Freq: Once | OROMUCOSAL | Status: AC
  Administered 2011-06-22: 20 mL via OROMUCOSAL

## 2011-06-22 MED ORDER — PENICILLIN V POTASSIUM 500 MG PO TABS: 500.0000 mg | ORAL_TABLET | Freq: Four times a day (QID) | ORAL | Status: AC
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
## 2011-06-22 NOTE — ED Notes (Signed)
Patient c/o toothache.  C/o pain in the back of mouth on the left, reports top and bottom tooth pain.  Patient reports chipping tooth yesterday on the bottom.  Reports cold air painful.

## 2011-07-17 ENCOUNTER — Encounter (HOSPITAL_COMMUNITY): Payer: Self-pay | Admitting: *Deleted

## 2011-07-17 ENCOUNTER — Observation Stay (HOSPITAL_COMMUNITY)
Admission: EM | Admit: 2011-07-17 | Discharge: 2011-07-18 | Disposition: A | Discharge status: Home / Self Care | Payer: Medicaid Other | Source: Ambulatory Visit | Attending: Family Medicine | Admitting: Family Medicine

## 2011-07-17 DIAGNOSIS — K219 Gastro-esophageal reflux disease without esophagitis: Secondary | ICD-10-CM | POA: Insufficient documentation

## 2011-07-17 DIAGNOSIS — R1011 Right upper quadrant pain: Secondary | ICD-10-CM | POA: Insufficient documentation

## 2011-07-17 DIAGNOSIS — R300 Dysuria: Secondary | ICD-10-CM | POA: Insufficient documentation

## 2011-07-17 DIAGNOSIS — F1410 Cocaine abuse, uncomplicated: Secondary | ICD-10-CM | POA: Insufficient documentation

## 2011-07-17 DIAGNOSIS — R1115 Cyclical vomiting syndrome unrelated to migraine: Secondary | ICD-10-CM

## 2011-07-17 DIAGNOSIS — F1020 Alcohol dependence, uncomplicated: Secondary | ICD-10-CM | POA: Insufficient documentation

## 2011-07-17 DIAGNOSIS — R1012 Left upper quadrant pain: Principal | ICD-10-CM | POA: Insufficient documentation

## 2011-07-17 DIAGNOSIS — F17200 Nicotine dependence, unspecified, uncomplicated: Secondary | ICD-10-CM | POA: Insufficient documentation

## 2011-07-17 DIAGNOSIS — Z96649 Presence of unspecified artificial hip joint: Secondary | ICD-10-CM | POA: Insufficient documentation

## 2011-07-17 DIAGNOSIS — K2921 Alcoholic gastritis with bleeding: Secondary | ICD-10-CM | POA: Insufficient documentation

## 2011-07-17 DIAGNOSIS — F1910 Other psychoactive substance abuse, uncomplicated: Secondary | ICD-10-CM | POA: Diagnosis present

## 2011-07-17 DIAGNOSIS — K920 Hematemesis: Secondary | ICD-10-CM | POA: Insufficient documentation

## 2011-07-17 DIAGNOSIS — K861 Other chronic pancreatitis: Secondary | ICD-10-CM | POA: Insufficient documentation

## 2011-07-17 DIAGNOSIS — R109 Unspecified abdominal pain: Secondary | ICD-10-CM

## 2011-07-17 LAB — CBC
MCH: 28.8 pg (ref 26.0–34.0)
MCHC: 34.6 g/dL (ref 30.0–36.0)
MCHC: 32.6 g/dL (ref 30.0–36.0)
MCV: 87.4 fL (ref 78.0–100.0)
Platelets: 280 10*3/uL (ref 150–400)
Platelets: 258 10*3/uL (ref 150–400)
RDW: 14.4 % (ref 11.5–15.5)
RDW: 14.4 % (ref 11.5–15.5)
WBC: 5.6 10*3/uL (ref 4.0–10.5)

## 2011-07-17 LAB — DIFFERENTIAL
Basophils Absolute: 0.1 10*3/uL (ref 0.0–0.1)
Basophils Relative: 1 % (ref 0–1)
Eosinophils Absolute: 0.2 10*3/uL (ref 0.0–0.7)
Eosinophils Relative: 4 % (ref 0–5)
Lymphocytes Relative: 37 % (ref 12–46)

## 2011-07-17 LAB — COMPREHENSIVE METABOLIC PANEL
Albumin: 3.8 g/dL (ref 3.5–5.2)
Alkaline Phosphatase: 54 U/L (ref 39–117)
BUN: 16 mg/dL (ref 6–23)
Chloride: 105 mEq/L (ref 96–112)
GFR calc Af Amer: >90 mL/min (ref >90)
Glucose, Bld: 69 mg/dL — ABNORMAL LOW (ref 70–99)
Potassium: 4.3 mEq/L (ref 3.5–5.1)
Total Bilirubin: 0.5 mg/dL (ref 0.3–1.2)

## 2011-07-17 LAB — HIV ANTIBODY (ROUTINE TESTING W REFLEX): HIV: NONREACTIVE

## 2011-07-17 LAB — CREATININE, SERUM: Creatinine, Ser: 1.11 mg/dL (ref 0.50–1.35)

## 2011-07-17 LAB — RPR: RPR Ser Ql: NONREACTIVE

## 2011-07-17 LAB — URINALYSIS, ROUTINE W REFLEX MICROSCOPIC
Bilirubin Urine: NEGATIVE
Glucose, UA: NEGATIVE mg/dL
Ketones, ur: NEGATIVE mg/dL
Protein, ur: NEGATIVE mg/dL

## 2011-07-17 LAB — LIPASE, BLOOD: Lipase: 37 U/L (ref 11–59)

## 2011-07-17 LAB — LACTIC ACID, PLASMA: Lactic Acid, Venous: 0.5 mmol/L (ref 0.5–2.2)

## 2011-07-17 LAB — URINE MICROSCOPIC-ADD ON

## 2011-07-17 MED ORDER — LORAZEPAM 2 MG/ML IJ SOLN: 1.0000 mg | Freq: Four times a day (QID) | INTRAMUSCULAR | Status: DC | PRN

## 2011-07-17 MED ORDER — SODIUM CHLORIDE 0.9 % IV SOLN
Freq: Once | INTRAVENOUS | Status: AC
  Administered 2011-07-17: 1000 mL via INTRAVENOUS

## 2011-07-17 MED ORDER — OXYCODONE-ACETAMINOPHEN 5-325 MG PO TABS
2.0000 | ORAL_TABLET | Freq: Once | ORAL | Status: AC
  Administered 2011-07-17: 2 via ORAL
  Filled 2011-07-17: qty 2

## 2011-07-17 MED ORDER — ACETAMINOPHEN 325 MG PO TABS: 650.0000 mg | ORAL_TABLET | Freq: Four times a day (QID) | ORAL | Status: DC | PRN

## 2011-07-17 MED ORDER — LORAZEPAM 1 MG PO TABS: 1.0000 mg | ORAL_TABLET | Freq: Four times a day (QID) | ORAL | Status: DC | PRN

## 2011-07-17 MED ORDER — ADULT MULTIVITAMIN W/MINERALS CH: 1.0000 | ORAL_TABLET | Freq: Every day | ORAL | Status: DC

## 2011-07-17 MED ORDER — ALUM & MAG HYDROXIDE-SIMETH 200-200-20 MG/5ML PO SUSP: 30.0000 mL | Freq: Four times a day (QID) | ORAL | Status: DC | PRN

## 2011-07-17 MED ORDER — ONDANSETRON HCL 4 MG/2ML IJ SOLN
4.0000 mg | Freq: Once | INTRAMUSCULAR | Status: AC
  Administered 2011-07-17: 4 mg via INTRAVENOUS
  Filled 2011-07-17: qty 2

## 2011-07-17 MED ORDER — HYDROCODONE-ACETAMINOPHEN 5-325 MG PO TABS
1.0000 | ORAL_TABLET | ORAL | Status: DC | PRN
  Administered 2011-07-18: 2 via ORAL
  Filled 2011-07-17: qty 2

## 2011-07-17 MED ORDER — ONDANSETRON HCL 4 MG/2ML IJ SOLN: 4.0000 mg | Freq: Four times a day (QID) | INTRAMUSCULAR | Status: DC | PRN

## 2011-07-17 MED ORDER — FOLIC ACID 1 MG PO TABS
1.0000 mg | ORAL_TABLET | Freq: Every day | ORAL | Status: DC
  Administered 2011-07-18: 1 mg via ORAL
  Filled 2011-07-17: qty 1

## 2011-07-17 MED ORDER — THIAMINE HCL 100 MG/ML IJ SOLN
Freq: Once | INTRAVENOUS | Status: AC
  Administered 2011-07-17: 20:00:00 via INTRAVENOUS
  Filled 2011-07-17 (×2): qty 1000

## 2011-07-17 MED ORDER — HYDROMORPHONE HCL PF 1 MG/ML IJ SOLN
1.0000 mg | Freq: Once | INTRAMUSCULAR | Status: AC
  Administered 2011-07-17: 1 mg via INTRAVENOUS
  Filled 2011-07-17: qty 1

## 2011-07-17 MED ORDER — ACETAMINOPHEN 650 MG RE SUPP: 650.0000 mg | Freq: Four times a day (QID) | RECTAL | Status: DC | PRN

## 2011-07-17 MED ORDER — THIAMINE HCL 100 MG/ML IJ SOLN
100.0000 mg | Freq: Every day | INTRAMUSCULAR | Status: DC
  Filled 2011-07-17: qty 1

## 2011-07-17 MED ORDER — NICOTINE 14 MG/24HR TD PT24
14.0000 mg | MEDICATED_PATCH | Freq: Every day | TRANSDERMAL | Status: DC
  Administered 2011-07-17 – 2011-07-18 (×2): 14 mg via TRANSDERMAL
  Filled 2011-07-17 (×3): qty 1

## 2011-07-17 MED ORDER — ONDANSETRON HCL 4 MG PO TABS: 4.0000 mg | ORAL_TABLET | Freq: Four times a day (QID) | ORAL | Status: DC | PRN

## 2011-07-17 MED ORDER — DOCUSATE SODIUM 100 MG PO CAPS
100.0000 mg | ORAL_CAPSULE | Freq: Two times a day (BID) | ORAL | Status: DC
  Administered 2011-07-17: 100 mg via ORAL
  Filled 2011-07-17 (×2): qty 1

## 2011-07-17 MED ORDER — GI COCKTAIL ~~LOC~~
30.0000 mL | Freq: Once | ORAL | Status: AC
  Administered 2011-07-17: 30 mL via ORAL
  Filled 2011-07-17: qty 30

## 2011-07-17 MED ORDER — ENOXAPARIN SODIUM 40 MG/0.4ML ~~LOC~~ SOLN
40.0000 mg | SUBCUTANEOUS | Status: DC
  Administered 2011-07-17: 40 mg via SUBCUTANEOUS
  Filled 2011-07-17 (×2): qty 0.4

## 2011-07-17 MED ORDER — FAMOTIDINE IN NACL 20-0.9 MG/50ML-% IV SOLN
20.0000 mg | Freq: Once | INTRAVENOUS | Status: AC
  Administered 2011-07-17: 20 mg via INTRAVENOUS
  Filled 2011-07-17: qty 50

## 2011-07-17 MED ORDER — VITAMIN B-1 100 MG PO TABS
100.0000 mg | ORAL_TABLET | Freq: Every day | ORAL | Status: DC
  Administered 2011-07-18: 100 mg via ORAL
  Filled 2011-07-17: qty 1

## 2011-07-17 MED ORDER — ADULT MULTIVITAMIN W/MINERALS CH
1.0000 | ORAL_TABLET | Freq: Every day | ORAL | Status: DC
  Administered 2011-07-18: 1 via ORAL
  Filled 2011-07-17: qty 1

## 2011-07-17 MED ORDER — DEXTROSE-NACL 5-0.45 % IV SOLN
INTRAVENOUS | Status: DC
  Administered 2011-07-17 – 2011-07-18 (×2): via INTRAVENOUS

## 2011-07-17 MED ORDER — MORPHINE SULFATE 2 MG/ML IJ SOLN
2.0000 mg | INTRAMUSCULAR | Status: DC | PRN
  Administered 2011-07-17 – 2011-07-18 (×6): 2 mg via INTRAVENOUS
  Filled 2011-07-17 (×6): qty 1
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
## 2011-07-17 NOTE — ED Notes (Signed)
Report called to Marylene Land, RN on 5100. Patient being transported to 5118.

## 2011-07-17 NOTE — ED Notes (Signed)
Family Practice at bedside.

## 2011-07-17 NOTE — ED Notes (Signed)
C/o abd pain, onset yesterday, worse this am, has been drinking ETOH, "thinks it is r/t an ulcer", h/o pancreatitis, h/o same pain. Took advil 2d ago, took 6 yesterday. Pt moaning & doubled over.
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
## 2011-07-17 NOTE — ED Notes (Signed)
Patient remains in pain and trying to sleep.

## 2011-07-17 NOTE — ED Notes (Signed)
Patient resting with less pain at this time.

## 2011-07-17 NOTE — ED Notes (Signed)
Patient remains in pain and asking for pain medication.
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
## 2011-07-17 NOTE — ED Notes (Signed)
Warm blankets given.

## 2011-07-17 NOTE — H&P (Signed)
Family Medicine Teaching Service Attending Note  I interviewed and examined patient [name] and reviewed their tests and x-rays.  I discussed with Dr. Rivka Safer and reviewed their note for today.  I agree with their assessment and plan.     Additionally  Currently in no acute distress sitting in bed.  Mildly tender in epigastric area around this hernia but this is easily reduced.  No guarding or rebound.  Most consistent with chronic pancreatitis flare. No signs of focal infection or obstruction. Advance diet and wean pain medications.  Agree with no CT unless worsens

## 2011-07-18 DIAGNOSIS — R109 Unspecified abdominal pain: Secondary | ICD-10-CM

## 2011-07-18 LAB — COMPREHENSIVE METABOLIC PANEL
ALT: 11 U/L (ref 0–53)
BUN: 15 mg/dL (ref 6–23)
CO2: 27 mEq/L (ref 19–32)
Calcium: 8.7 mg/dL (ref 8.4–10.5)
Creatinine, Ser: 1.2 mg/dL (ref 0.50–1.35)
GFR calc Af Amer: 84 mL/min — ABNORMAL LOW (ref >90)
GFR calc non Af Amer: 72 mL/min — ABNORMAL LOW (ref >90)
Glucose, Bld: 68 mg/dL — ABNORMAL LOW (ref 70–99)
Sodium: 142 mEq/L (ref 135–145)

## 2011-07-18 LAB — URINE DRUGS OF ABUSE SCREEN W ALC, ROUTINE (REF LAB)
Amphetamine Screen, Ur: NEGATIVE
Cocaine Metabolites: POSITIVE — AB
Creatinine,U: 178.8 mg/dL
Marijuana Metabolite: NEGATIVE
Opiate Screen, Urine: POSITIVE — AB

## 2011-07-18 LAB — GC/CHLAMYDIA PROBE AMP, URINE: Chlamydia, Swab/Urine, PCR: NEGATIVE

## 2011-07-18 MED ORDER — HYDROCODONE-ACETAMINOPHEN 5-325 MG PO TABS: 1.0000 | ORAL_TABLET | ORAL | Status: AC | PRN

## 2011-07-18 MED ORDER — HYDROCODONE-ACETAMINOPHEN 5-325 MG PO TABS: 1.0000 | ORAL_TABLET | ORAL | Status: DC | PRN

## 2011-07-18 NOTE — Progress Notes (Signed)
Pt given dc instructions. Rn went over medications and incision/wound care. Pt verbalized understanding and had no questions regarding care of instructions.  

## 2011-07-18 NOTE — Discharge Instructions (Signed)
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 This is an inflammation of the lining of the stomach. It is caused by drinking alcohol. The symptoms may include: burning abdominal pain, nausea, vomiting or even vomiting blood. It can be made worse by a poor diet. People who drink frequently often do not eat well. Taking aspirin or other anti-inflammatory medications increases stomach irritation and bleeding. These medicines should be avoided. °Treatment is aimed at the cause. You have to stop drinking alcohol if you want to get better. Eat a healthy, well-balanced diet. You may take liquid antacids as needed. Your caregiver may prescribe medications to help heal the stomach lining. Take these as prescribed. °PREVENTION  °· Anyone who has experienced alcoholic gastritis should consider that alcoholism may be an issue. Professional evaluation is highly recommended.  °· Although some people can recover without help, most need assistance. With treatment and support, many are able to stop drinking and rebuild their lives. Long-term recovery is possible.  °· Alcohol Addiction cannot be cured, but it can be treated successfully. Treatment centers are listed in telephone listings under:  °· Alcoholism and Addiction Treatment; Substance Abuse Treatment or Cocaine, Narcotics and Alcoholics Anonymous. Most hospitals and clinics can refer you to a specialized care center.  °· The [place] government maintains a toll-free number for treatment referrals: 1-800-662-4357 or 1-800-487-4889 (TDD). They also maintain a website: http://findtreatment.samhsa.gov. Other websites for more information are: www.mentalhealth.samhsa.gov and www.nida.gov.  °· In [place], treatment resources are listed in each province. Listings are available under:  °· [employer] or similar titles.  °SEEK IMMEDIATE MEDICAL CARE IF:  °· You develop severe abdominal pain, uncontrolled vomiting, or vomiting blood.  °· You blackout or have  fainting spells.  °· You develop seizures (this could be life threatening).  °· You develop bloody stools or stools that appear black or tarry.  °Document Released: 06/22/2004 Document Revised: 01/25/2011 Document Reviewed: 05/19/2009 °ExitCare® Patient Information ©2012 [employer]. °

## 2011-07-18 NOTE — Discharge Summary (Signed)
I have reviewed this discharge summary and agree.    
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
## 2011-07-21 LAB — COCAINE, URINE, CONFIRMATION: Benzoylecgonine GC/MS Conf: 44737 NG/ML — ABNORMAL HIGH

## 2011-07-21 LAB — BARBITURATE, URINE, CONFIRMATION
Butalbital UR Quant: NEGATIVE NG/ML
Pentobarbital GC/MS Conf: NEGATIVE NG/ML
Phenobarbital GC/MS Conf: 550 NG/ML — ABNORMAL HIGH

## 2011-07-21 LAB — OPIATE, QUANTITATIVE, URINE
Hydrocodone: NEGATIVE NG/ML
Morphine, Confirm: 1938 NG/ML — ABNORMAL HIGH
Oxycodone, ur: 614 NG/ML — ABNORMAL HIGH
Oxymorphone: NEGATIVE NG/ML

## 2011-11-27 ENCOUNTER — Emergency Department (HOSPITAL_COMMUNITY): Payer: Medicaid Other

## 2011-11-27 ENCOUNTER — Encounter (HOSPITAL_COMMUNITY): Payer: Self-pay | Admitting: Emergency Medicine

## 2011-11-27 ENCOUNTER — Observation Stay (HOSPITAL_COMMUNITY)
Admission: EM | Admit: 2011-11-27 | Discharge: 2011-11-28 | Disposition: A | Discharge status: Home / Self Care | Payer: Medicaid Other | Source: Ambulatory Visit | Attending: Family Medicine | Admitting: Family Medicine

## 2011-11-27 ENCOUNTER — Observation Stay (HOSPITAL_COMMUNITY): Payer: Medicaid Other

## 2011-11-27 DIAGNOSIS — R109 Unspecified abdominal pain: Secondary | ICD-10-CM

## 2011-11-27 DIAGNOSIS — K436 Other and unspecified ventral hernia with obstruction, without gangrene: Secondary | ICD-10-CM

## 2011-11-27 DIAGNOSIS — R112 Nausea with vomiting, unspecified: Secondary | ICD-10-CM | POA: Insufficient documentation

## 2011-11-27 DIAGNOSIS — K439 Ventral hernia without obstruction or gangrene: Principal | ICD-10-CM | POA: Insufficient documentation

## 2011-11-27 DIAGNOSIS — F1410 Cocaine abuse, uncomplicated: Secondary | ICD-10-CM

## 2011-11-27 DIAGNOSIS — K122 Cellulitis and abscess of mouth: Secondary | ICD-10-CM

## 2011-11-27 DIAGNOSIS — Z96659 Presence of unspecified artificial knee joint: Secondary | ICD-10-CM | POA: Insufficient documentation

## 2011-11-27 DIAGNOSIS — F1910 Other psychoactive substance abuse, uncomplicated: Secondary | ICD-10-CM | POA: Insufficient documentation

## 2011-11-27 DIAGNOSIS — K044 Acute apical periodontitis of pulpal origin: Secondary | ICD-10-CM | POA: Insufficient documentation

## 2011-11-27 LAB — CBC WITH DIFFERENTIAL/PLATELET
Basophils Relative: 1 % (ref 0–1)
Eosinophils Absolute: 0.2 10*3/uL (ref 0.0–0.7)
HCT: 39.8 % (ref 39.0–52.0)
Hemoglobin: 13.5 g/dL (ref 13.0–17.0)
Lymphs Abs: 2.3 10*3/uL (ref 0.7–4.0)
MCH: 29.5 pg (ref 26.0–34.0)
MCHC: 33.9 g/dL (ref 30.0–36.0)
Monocytes Absolute: 0.7 10*3/uL (ref 0.1–1.0)
Monocytes Relative: 8 % (ref 3–12)
Neutrophils Relative %: 62 % (ref 43–77)
RBC: 4.58 MIL/uL (ref 4.22–5.81)

## 2011-11-27 LAB — COMPREHENSIVE METABOLIC PANEL
ALT: 17 U/L (ref 0–53)
AST: 28 U/L (ref 0–37)
Albumin: 3.7 g/dL (ref 3.5–5.2)
Alkaline Phosphatase: 54 U/L (ref 39–117)
Calcium: 9.3 mg/dL (ref 8.4–10.5)
Potassium: 3.8 mEq/L (ref 3.5–5.1)
Sodium: 142 mEq/L (ref 135–145)
Total Protein: 7.6 g/dL (ref 6.0–8.3)

## 2011-11-27 LAB — RAPID URINE DRUG SCREEN, HOSP PERFORMED
Barbiturates: NOT DETECTED
Tetrahydrocannabinol: NOT DETECTED

## 2011-11-27 LAB — CBC
MCH: 29.9 pg (ref 26.0–34.0)
MCHC: 34.4 g/dL (ref 30.0–36.0)
MCV: 86.8 fL (ref 78.0–100.0)
Platelets: 233 10*3/uL (ref 150–400)
RBC: 4.25 MIL/uL (ref 4.22–5.81)

## 2011-11-27 LAB — URINE MICROSCOPIC-ADD ON

## 2011-11-27 LAB — URINALYSIS, ROUTINE W REFLEX MICROSCOPIC
Bilirubin Urine: NEGATIVE
Glucose, UA: NEGATIVE mg/dL
Specific Gravity, Urine: 1.013 (ref 1.005–1.030)
Urobilinogen, UA: 0.2 mg/dL (ref 0.0–1.0)

## 2011-11-27 LAB — OCCULT BLOOD, POC DEVICE: Fecal Occult Bld: NEGATIVE

## 2011-11-27 LAB — POCT I-STAT TROPONIN I: Troponin i, poc: 0 ng/mL (ref 0.00–0.08)

## 2011-11-27 MED ORDER — ACETAMINOPHEN 325 MG PO TABS
650.0000 mg | ORAL_TABLET | Freq: Four times a day (QID) | ORAL | Status: DC | PRN
  Administered 2011-11-28: 650 mg via ORAL
  Filled 2011-11-27: qty 2

## 2011-11-27 MED ORDER — ACETAMINOPHEN 650 MG RE SUPP: 650.0000 mg | Freq: Four times a day (QID) | RECTAL | Status: DC | PRN

## 2011-11-27 MED ORDER — NICOTINE 14 MG/24HR TD PT24
14.0000 mg | MEDICATED_PATCH | Freq: Every day | TRANSDERMAL | Status: DC | PRN
  Filled 2011-11-27: qty 1

## 2011-11-27 MED ORDER — IOHEXOL 300 MG/ML  SOLN
100.0000 mL | Freq: Once | INTRAMUSCULAR | Status: AC | PRN
  Administered 2011-11-27: 100 mL via INTRAVENOUS

## 2011-11-27 MED ORDER — MORPHINE SULFATE 4 MG/ML IJ SOLN
4.0000 mg | Freq: Once | INTRAMUSCULAR | Status: AC
  Administered 2011-11-27: 4 mg via INTRAVENOUS
  Filled 2011-11-27: qty 1

## 2011-11-27 MED ORDER — NYSTATIN 100000 UNIT/ML MT SUSP
5.0000 mL | Freq: Four times a day (QID) | OROMUCOSAL | Status: DC
  Administered 2011-11-27 – 2011-11-28 (×5): 500000 [IU] via ORAL
  Filled 2011-11-27 (×8): qty 5

## 2011-11-27 MED ORDER — MORPHINE SULFATE 4 MG/ML IJ SOLN
4.0000 mg | Freq: Once | INTRAMUSCULAR | Status: AC
  Administered 2011-11-27: 4 mg via INTRAVENOUS

## 2011-11-27 MED ORDER — SODIUM CHLORIDE 0.9 % IV BOLUS (SEPSIS)
1000.0000 mL | Freq: Once | INTRAVENOUS | Status: AC
  Administered 2011-11-27: 1000 mL via INTRAVENOUS

## 2011-11-27 MED ORDER — ONDANSETRON HCL 4 MG/2ML IJ SOLN: 4.0000 mg | Freq: Four times a day (QID) | INTRAMUSCULAR | Status: DC | PRN

## 2011-11-27 MED ORDER — ONDANSETRON HCL 4 MG/2ML IJ SOLN
4.0000 mg | Freq: Once | INTRAMUSCULAR | Status: AC
  Administered 2011-11-27: 4 mg via INTRAVENOUS
  Filled 2011-11-27: qty 2

## 2011-11-27 MED ORDER — ONDANSETRON 4 MG PO TBDP
ORAL_TABLET | ORAL | Status: AC
  Filled 2011-11-27: qty 2

## 2011-11-27 MED ORDER — HEPARIN SODIUM (PORCINE) 5000 UNIT/ML IJ SOLN
5000.0000 [IU] | Freq: Three times a day (TID) | INTRAMUSCULAR | Status: DC
  Administered 2011-11-27 – 2011-11-28 (×3): 5000 [IU] via SUBCUTANEOUS
  Filled 2011-11-27 (×5): qty 1

## 2011-11-27 MED ORDER — DIAZEPAM 5 MG/ML IJ SOLN
5.0000 mg | Freq: Once | INTRAMUSCULAR | Status: AC
  Administered 2011-11-27: 5 mg via INTRAVENOUS
  Filled 2011-11-27: qty 2

## 2011-11-27 MED ORDER — IOHEXOL 300 MG/ML  SOLN
20.0000 mL | INTRAMUSCULAR | Status: AC
  Administered 2011-11-27 (×2): 20 mL via ORAL

## 2011-11-27 MED ORDER — MORPHINE SULFATE 4 MG/ML IJ SOLN
8.0000 mg | Freq: Once | INTRAMUSCULAR | Status: AC
  Administered 2011-11-27: 8 mg via INTRAVENOUS
  Filled 2011-11-27: qty 2

## 2011-11-27 MED ORDER — ONDANSETRON HCL 4 MG PO TABS: 4.0000 mg | ORAL_TABLET | Freq: Four times a day (QID) | ORAL | Status: DC | PRN

## 2011-11-27 MED ORDER — MORPHINE SULFATE 4 MG/ML IJ SOLN
2.0000 mg | INTRAMUSCULAR | Status: DC | PRN
  Administered 2011-11-27 – 2011-11-28 (×6): 2 mg via INTRAVENOUS
  Filled 2011-11-27 (×6): qty 1

## 2011-11-27 MED ORDER — SODIUM CHLORIDE 0.9 % IV SOLN
INTRAVENOUS | Status: DC
  Administered 2011-11-27: 125 mL/h via INTRAVENOUS
  Administered 2011-11-27 – 2011-11-28 (×2): via INTRAVENOUS

## 2011-11-27 MED ORDER — MORPHINE SULFATE 4 MG/ML IJ SOLN
4.0000 mg | Freq: Once | INTRAMUSCULAR | Status: DC
  Filled 2011-11-27: qty 2
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
## 2011-11-27 NOTE — ED Notes (Signed)
Report received, assumed care.  

## 2011-11-27 NOTE — ED Notes (Signed)
Pt finished CT contrast, CT notified.  
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
## 2011-11-27 NOTE — ED Notes (Addendum)
Per EMS  abdominal pain  for two days, GEN, tender when you touch every any where, pt stated he has ulcer, also tooth pain left bottom for  One day.

## 2011-11-27 NOTE — ED Provider Notes (Signed)
Pt with abdominal pain, reports recent ETOH use Also reports rectal bleeding - rectal exam negative for blood, neg hemoccult (chaperone present) Workup pending at this time Pt seen with PA Retta Diones, MD 11/27/11 [phone]
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
## 2011-11-27 NOTE — Consult Note (Signed)
Very small ventral hernia/incisional hernia, chronic, with only small amount of partially reducible fat/omentum.  This is not a surgical emergency, and can be referred out for elective consideration.  Marta Lamas. Gae Bon, MD, FACS [phone] [facility]

## 2011-11-28 LAB — COMPREHENSIVE METABOLIC PANEL
ALT: 12 U/L (ref 0–53)
Alkaline Phosphatase: 38 U/L — ABNORMAL LOW (ref 39–117)
BUN: 9 mg/dL (ref 6–23)
CO2: 24 mEq/L (ref 19–32)
Calcium: 8.4 mg/dL (ref 8.4–10.5)
GFR calc Af Amer: 83 mL/min — ABNORMAL LOW (ref >90)
GFR calc non Af Amer: 71 mL/min — ABNORMAL LOW (ref >90)
Glucose, Bld: 89 mg/dL (ref 70–99)
Potassium: 3.6 mEq/L (ref 3.5–5.1)
Sodium: 138 mEq/L (ref 135–145)

## 2011-11-28 LAB — URINALYSIS, ROUTINE W REFLEX MICROSCOPIC
Glucose, UA: NEGATIVE mg/dL
Ketones, ur: NEGATIVE mg/dL
Leukocytes, UA: NEGATIVE
Nitrite: NEGATIVE
Protein, ur: NEGATIVE mg/dL
pH: 5.5 (ref 5.0–8.0)

## 2011-11-28 LAB — CBC
MCHC: 33.4 g/dL (ref 30.0–36.0)
RDW: 14 % (ref 11.5–15.5)

## 2011-11-28 LAB — HEMOGLOBIN AND HEMATOCRIT, BLOOD: HCT: 33.9 % — ABNORMAL LOW (ref 39.0–52.0)

## 2011-11-28 LAB — URINE MICROSCOPIC-ADD ON

## 2011-11-28 MED ORDER — TRAMADOL HCL 50 MG PO TABS: 50.0000 mg | ORAL_TABLET | Freq: Four times a day (QID) | ORAL | Status: AC | PRN

## 2011-11-28 MED ORDER — PENICILLIN V POTASSIUM 500 MG PO TABS
500.0000 mg | ORAL_TABLET | Freq: Four times a day (QID) | ORAL | Status: DC
  Administered 2011-11-28: 500 mg via ORAL
  Filled 2011-11-28 (×5): qty 1

## 2011-11-28 MED ORDER — TRAMADOL HCL 50 MG PO TABS: 50.0000 mg | ORAL_TABLET | Freq: Four times a day (QID) | ORAL | Status: DC | PRN

## 2011-11-28 MED ORDER — PENICILLIN V POTASSIUM 500 MG PO TABS: 500.0000 mg | ORAL_TABLET | Freq: Four times a day (QID) | ORAL | Status: AC
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
## 2011-11-28 NOTE — Progress Notes (Signed)
Utilization review complete 

## 2011-11-28 NOTE — Progress Notes (Signed)
Clinical Child psychotherapist (CSW) contacted to visit pt and provide substance abuse resources before pt discharged today.  CSW provided pt with substance abuse resources and a list of Dentist in [place] that accept [place].  CSW to include full assessment.  Theresia Bough, MSW, Theresia Majors [phone]
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
## 2011-11-28 NOTE — Progress Notes (Signed)
This patient does not require surgery for this small chronically incarcerated hernia.  Now complaining of neck pain and inability to swallow.  Marta Lamas. Gae Bon, MD, FACS [phone] [facility]

## 2011-11-28 NOTE — Progress Notes (Signed)
FMTS Attending Note  Patient seen and examined by me; reports abd pain is better with morphine but is more concerned about the pain along left inferior and posteriormost molar, which he states began to bother him more after using nystatin swish and swallow yesterday. Surgery's assessment of CT and plans for elective management of periumbilical hernia reviewed. Discussed with patient, who is eager to have surgery.   Exam: generally well appearing, no apparent dsitress HEENT MMM. Some erythema along L posterior-inferior molar; reactive submandibular adenopathy on the L side.  ABD Soft; small nontender protuberance along superior aspect umbilicus.  Healed old scars.   AssessPlan: Small periumbilical hernia, not incarcerated.  Will attempt to secure outpatient appt with [facility] for scheduling elective repair.  Odontogenic infection in patient with Medicaid.  To initiate PCN and get him list of dental resources that work with Medicaid. Plan for discharge today.  Paula Compton, MD
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
## 2011-11-28 NOTE — Clinical Social Work Psychosocial (Signed)
     Clinical Social Work Department BRIEF PSYCHOSOCIAL ASSESSMENT 11/28/2011  Patient:  [name], [name]     Account Number:  [id number]     Admit date:  11/27/2011  Clinical Social Worker:  Lourdes Sledge  Date/Time:  11/28/2011 03:47 PM  Referred by:  Physician  Date Referred:  11/28/2011 Referred for  Substance Abuse   Other Referral:   Interview type:  Patient Other interview type:    PSYCHOSOCIAL DATA Living Status:  FAMILY Admitted from facility:   Level of care:   Primary support name:  Dorene Grebe [phone] Primary support relationship to patient:  PARENT Degree of support available:   During admission pt reported having supportive family and living with parent.    CURRENT CONCERNS Current Concerns  Substance Abuse   Other Concerns:    SOCIAL WORK ASSESSMENT / PLAN CSW informed by physician that pt ready for dc however interested in substance abuse resources and a list of Dentist who accept Medicaid.    CSW made aware that pt waiting to be discharged after pt sees CSW. CSW visited pt room and assessed pt drug use. Pt stated he still wasn't feeling well, admitted to drinking alcohol, smoking marijuana and cocaine regularly. Pt did not disclose additional information however gladly accepted substance abuse resources in [facility]. Pt stated he will think about residential substance abuse treatment when discharged however will also consider outpatient substance abuse counseling.    Pt also reported that he has been having a lo tof pain in his mouth and is in need of seeing a dentist. CSW provided pt with a list of local dentist accepting Medicaid.    Pt appreciative of resources and visit from CSW. Pt declines having additional needs. CSW signing off.   Assessment/plan status:  No Further Intervention Required Other assessment/ plan:   Information/referral to community resources:   CSW provided pt with substance abuse resources in [facility]. CSW  also provided pt with a list of dentist in [facility] who accept Medicaid.    PATIENTS/FAMILYS RESPONSE TO PLAN OF CARE: Pt laying in bed, alert, oriented and appeared to be in pain. Pt admitted to drug use however did not provide additional information regarding usage or reason for use. Pt states he is interested in some form of rehab however unsure at this moment the level he would be willing to commit to. Pt states he will take time looking over the list and decide whether he will do outpatient or inpatient substance abuse. Pt states he has no other needs. CSW signing off.

## 2011-11-30 NOTE — ED Provider Notes (Signed)
Medical screening examination/treatment/procedure(s) were conducted as a shared visit with non-physician practitioner(s) and myself.  I personally evaluated the patient during the encounter   Patrick W Jaydis Duchene, MD 11/30/11 0657 

## 2011-11-30 NOTE — Discharge Summary (Signed)
FMTS attending note Patient seen and examined by me on the day of discharge, discussed with resident team.  I agree with plan for discharge with outpatient surgical consultation for elective periumbilical hernia repair, as well as dental resources for outpatient dental care to address odontogenic infection in L lower molar.  Will discharge on PCN to address apparent acute dental infection.  Paula Compton, MD

## 2011-12-04 ENCOUNTER — Ambulatory Visit (INDEPENDENT_AMBULATORY_CARE_PROVIDER_SITE_OTHER): Payer: Medicaid Other | Admitting: General Surgery

## 2011-12-06 ENCOUNTER — Inpatient Hospital Stay: Payer: Medicaid Other | Admitting: Family Medicine

## 2012-10-11 ENCOUNTER — Ambulatory Visit: Payer: Medicaid Other | Admitting: Family Medicine

## 2012-11-28 ENCOUNTER — Inpatient Hospital Stay (HOSPITAL_COMMUNITY): Payer: Medicaid Other | Admitting: Anesthesiology

## 2012-11-28 ENCOUNTER — Other Ambulatory Visit: Payer: Self-pay

## 2012-11-28 ENCOUNTER — Inpatient Hospital Stay (HOSPITAL_COMMUNITY)
Admission: EM | Admit: 2012-11-28 | Discharge: 2012-12-05 | DRG: 956 | Disposition: A | Discharge status: Rehabilitation Facility | Payer: Medicaid Other | Attending: General Surgery | Admitting: General Surgery

## 2012-11-28 ENCOUNTER — Inpatient Hospital Stay (HOSPITAL_COMMUNITY): Payer: Medicaid Other

## 2012-11-28 ENCOUNTER — Emergency Department (HOSPITAL_COMMUNITY): Payer: Medicaid Other

## 2012-11-28 ENCOUNTER — Encounter (HOSPITAL_COMMUNITY): Admission: EM | Disposition: A | Discharge status: Rehabilitation Facility | Payer: Self-pay | Source: Home / Self Care

## 2012-11-28 ENCOUNTER — Encounter (HOSPITAL_COMMUNITY): Payer: Self-pay | Admitting: Radiology

## 2012-11-28 ENCOUNTER — Encounter (HOSPITAL_COMMUNITY): Payer: Self-pay | Admitting: Anesthesiology

## 2012-11-28 DIAGNOSIS — S060X9A Concussion with loss of consciousness of unspecified duration, initial encounter: Secondary | ICD-10-CM

## 2012-11-28 DIAGNOSIS — T148XXA Other injury of unspecified body region, initial encounter: Secondary | ICD-10-CM | POA: Diagnosis present

## 2012-11-28 DIAGNOSIS — S0285XA Fracture of orbit, unspecified, initial encounter for closed fracture: Secondary | ICD-10-CM

## 2012-11-28 DIAGNOSIS — S82009A Unspecified fracture of unspecified patella, initial encounter for closed fracture: Secondary | ICD-10-CM | POA: Diagnosis present

## 2012-11-28 DIAGNOSIS — N179 Acute kidney failure, unspecified: Secondary | ICD-10-CM | POA: Diagnosis present

## 2012-11-28 DIAGNOSIS — E870 Hyperosmolality and hypernatremia: Secondary | ICD-10-CM

## 2012-11-28 DIAGNOSIS — N189 Chronic kidney disease, unspecified: Secondary | ICD-10-CM

## 2012-11-28 DIAGNOSIS — S02402D Zygomatic fracture, unspecified, subsequent encounter for fracture with routine healing: Secondary | ICD-10-CM

## 2012-11-28 DIAGNOSIS — S02109A Fracture of base of skull, unspecified side, initial encounter for closed fracture: Secondary | ICD-10-CM | POA: Diagnosis present

## 2012-11-28 DIAGNOSIS — J95821 Acute postprocedural respiratory failure: Secondary | ICD-10-CM

## 2012-11-28 DIAGNOSIS — F10939 Alcohol use, unspecified with withdrawal, unspecified: Secondary | ICD-10-CM | POA: Diagnosis present

## 2012-11-28 DIAGNOSIS — J9600 Acute respiratory failure, unspecified whether with hypoxia or hypercapnia: Secondary | ICD-10-CM | POA: Diagnosis present

## 2012-11-28 DIAGNOSIS — F10239 Alcohol dependence with withdrawal, unspecified: Secondary | ICD-10-CM | POA: Diagnosis present

## 2012-11-28 DIAGNOSIS — S37009A Unspecified injury of unspecified kidney, initial encounter: Secondary | ICD-10-CM

## 2012-11-28 DIAGNOSIS — S02400A Malar fracture unspecified, initial encounter for closed fracture: Secondary | ICD-10-CM | POA: Diagnosis present

## 2012-11-28 DIAGNOSIS — S0240CA Maxillary fracture, right side, initial encounter for closed fracture: Secondary | ICD-10-CM

## 2012-11-28 DIAGNOSIS — S7290XA Unspecified fracture of unspecified femur, initial encounter for closed fracture: Secondary | ICD-10-CM

## 2012-11-28 DIAGNOSIS — T07XXXA Unspecified multiple injuries, initial encounter: Secondary | ICD-10-CM | POA: Diagnosis present

## 2012-11-28 DIAGNOSIS — S0990XA Unspecified injury of head, initial encounter: Secondary | ICD-10-CM

## 2012-11-28 DIAGNOSIS — S6290XB Unspecified fracture of unspecified wrist and hand, initial encounter for open fracture: Secondary | ICD-10-CM | POA: Diagnosis present

## 2012-11-28 DIAGNOSIS — F17200 Nicotine dependence, unspecified, uncomplicated: Secondary | ICD-10-CM | POA: Diagnosis present

## 2012-11-28 DIAGNOSIS — F1020 Alcohol dependence, uncomplicated: Secondary | ICD-10-CM | POA: Diagnosis present

## 2012-11-28 DIAGNOSIS — Z472 Encounter for removal of internal fixation device: Secondary | ICD-10-CM

## 2012-11-28 DIAGNOSIS — IMO0002 Reserved for concepts with insufficient information to code with codable children: Secondary | ICD-10-CM | POA: Diagnosis present

## 2012-11-28 DIAGNOSIS — F05 Delirium due to known physiological condition: Secondary | ICD-10-CM | POA: Diagnosis present

## 2012-11-28 DIAGNOSIS — S02401A Maxillary fracture, unspecified, initial encounter for closed fracture: Secondary | ICD-10-CM | POA: Diagnosis present

## 2012-11-28 DIAGNOSIS — S02402A Zygomatic fracture, unspecified, initial encounter for closed fracture: Secondary | ICD-10-CM

## 2012-11-28 DIAGNOSIS — S32409A Unspecified fracture of unspecified acetabulum, initial encounter for closed fracture: Secondary | ICD-10-CM | POA: Diagnosis present

## 2012-11-28 DIAGNOSIS — S7291XA Unspecified fracture of right femur, initial encounter for closed fracture: Secondary | ICD-10-CM

## 2012-11-28 DIAGNOSIS — F1910 Other psychoactive substance abuse, uncomplicated: Secondary | ICD-10-CM | POA: Diagnosis present

## 2012-11-28 DIAGNOSIS — D62 Acute posthemorrhagic anemia: Secondary | ICD-10-CM | POA: Diagnosis not present

## 2012-11-28 DIAGNOSIS — S32401A Unspecified fracture of right acetabulum, initial encounter for closed fracture: Secondary | ICD-10-CM

## 2012-11-28 DIAGNOSIS — S72309A Unspecified fracture of shaft of unspecified femur, initial encounter for closed fracture: Principal | ICD-10-CM | POA: Diagnosis present

## 2012-11-28 DIAGNOSIS — I959 Hypotension, unspecified: Secondary | ICD-10-CM

## 2012-11-28 HISTORY — PX: ORIF ACETABULAR FRACTURE: SHX5029

## 2012-11-28 HISTORY — PX: ORIF PATELLA: SHX5033

## 2012-11-28 HISTORY — PX: FEMUR IM NAIL: SHX1597

## 2012-11-28 HISTORY — PX: ULNAR COLLATERAL LIGAMENT REPAIR: SHX6159

## 2012-11-28 HISTORY — PX: APPLICATION OF WOUND VAC: SHX5189

## 2012-11-28 HISTORY — PX: HARDWARE REMOVAL: SHX979

## 2012-11-28 LAB — POCT I-STAT 3, ART BLOOD GAS (G3+)
Acid-base deficit: 4 mmol/L — ABNORMAL HIGH (ref 0.0–2.0)
Bicarbonate: 21.5 meq/L (ref 20.0–24.0)
pCO2 arterial: 42 mmHg (ref 35.0–45.0)
pH, Arterial: 7.318 — ABNORMAL LOW (ref 7.350–7.450)
pO2, Arterial: 175 mmHg — ABNORMAL HIGH (ref 80.0–100.0)

## 2012-11-28 LAB — COMPREHENSIVE METABOLIC PANEL
Albumin: 2.8 g/dL — ABNORMAL LOW (ref 3.5–5.2)
BUN: 12 mg/dL (ref 6–23)
Creatinine, Ser: 1.7 mg/dL — ABNORMAL HIGH (ref 0.50–1.35)
GFR calc Af Amer: 54 mL/min — ABNORMAL LOW (ref >90)
Glucose, Bld: 114 mg/dL — ABNORMAL HIGH (ref 70–99)
Total Bilirubin: 0.6 mg/dL (ref 0.3–1.2)
Total Protein: 5.6 g/dL — ABNORMAL LOW (ref 6.0–8.3)

## 2012-11-28 LAB — POCT I-STAT, CHEM 8
HCT: 37 % — ABNORMAL LOW (ref 39.0–52.0)
Hemoglobin: 12.6 g/dL — ABNORMAL LOW (ref 13.0–17.0)
Potassium: 4.2 mEq/L (ref 3.5–5.1)
Sodium: 151 mEq/L — ABNORMAL HIGH (ref 135–145)

## 2012-11-28 LAB — URINALYSIS, ROUTINE W REFLEX MICROSCOPIC
Glucose, UA: NEGATIVE mg/dL
Leukocytes, UA: NEGATIVE
Nitrite: NEGATIVE
Protein, ur: 30 mg/dL — AB
pH: 5.5 (ref 5.0–8.0)

## 2012-11-28 LAB — CBC
HCT: 35.8 % — ABNORMAL LOW (ref 39.0–52.0)
MCHC: 34.9 g/dL (ref 30.0–36.0)
MCV: 88.6 fL (ref 78.0–100.0)
RDW: 15.2 % (ref 11.5–15.5)

## 2012-11-28 LAB — APTT: aPTT: 26 seconds (ref 24–37)

## 2012-11-28 LAB — PROTIME-INR: Prothrombin Time: 14 seconds (ref 11.6–15.2)

## 2012-11-28 LAB — RAPID URINE DRUG SCREEN, HOSP PERFORMED
Cocaine: POSITIVE — AB
Opiates: NOT DETECTED

## 2012-11-28 LAB — LACTIC ACID, PLASMA: Lactic Acid, Venous: 2 mmol/L (ref 0.5–2.2)

## 2012-11-28 LAB — ABO/RH: ABO/RH(D): O POS

## 2012-11-28 LAB — URINE MICROSCOPIC-ADD ON

## 2012-11-28 SURGERY — OPEN REDUCTION INTERNAL FIXATION (ORIF) ACETABULAR FRACTURE
Anesthesia: General | Site: Knee | Laterality: Right

## 2012-11-28 MED ORDER — MIDAZOLAM HCL 5 MG/5ML IJ SOLN
INTRAMUSCULAR | Status: DC | PRN
  Administered 2012-11-28: 2 mg via INTRAVENOUS

## 2012-11-28 MED ORDER — LORAZEPAM 1 MG PO TABS: 1.0000 mg | ORAL_TABLET | Freq: Four times a day (QID) | ORAL | Status: AC | PRN

## 2012-11-28 MED ORDER — HYDROMORPHONE HCL PF 1 MG/ML IJ SOLN: 1.0000 mg | INTRAMUSCULAR | Status: DC | PRN

## 2012-11-28 MED ORDER — PANTOPRAZOLE SODIUM 40 MG IV SOLR
40.0000 mg | Freq: Every day | INTRAVENOUS | Status: DC
  Administered 2012-11-28 – 2012-11-30 (×3): 40 mg via INTRAVENOUS
  Filled 2012-11-28 (×6): qty 40

## 2012-11-28 MED ORDER — BIOTENE DRY MOUTH MT LIQD
15.0000 mL | Freq: Four times a day (QID) | OROMUCOSAL | Status: DC
  Administered 2012-11-28 – 2012-12-01 (×11): 15 mL via OROMUCOSAL

## 2012-11-28 MED ORDER — CEFAZOLIN SODIUM 1-5 GM-% IV SOLN
INTRAVENOUS | Status: AC
  Filled 2012-11-28: qty 50

## 2012-11-28 MED ORDER — PROPOFOL 10 MG/ML IV EMUL
5.0000 ug/kg/min | INTRAVENOUS | Status: DC
  Administered 2012-11-28: 65 ug/kg/min via INTRAVENOUS
  Administered 2012-11-29: 50 ug/kg/min via INTRAVENOUS
  Filled 2012-11-28 (×5): qty 100

## 2012-11-28 MED ORDER — VITAMIN B-1 100 MG PO TABS
100.0000 mg | ORAL_TABLET | Freq: Every day | ORAL | Status: DC
  Administered 2012-12-01 – 2012-12-03 (×3): 100 mg via ORAL
  Filled 2012-11-28 (×7): qty 1

## 2012-11-28 MED ORDER — ALBUMIN HUMAN 5 % IV SOLN
INTRAVENOUS | Status: DC | PRN
  Administered 2012-11-28 (×2): via INTRAVENOUS

## 2012-11-28 MED ORDER — CHLORHEXIDINE GLUCONATE 0.12 % MT SOLN
15.0000 mL | Freq: Two times a day (BID) | OROMUCOSAL | Status: DC
  Administered 2012-11-28 – 2012-12-05 (×13): 15 mL via OROMUCOSAL
  Filled 2012-11-28 (×16): qty 15

## 2012-11-28 MED ORDER — ROCURONIUM BROMIDE 100 MG/10ML IV SOLN
INTRAVENOUS | Status: DC | PRN
  Administered 2012-11-28: 10 mg via INTRAVENOUS
  Administered 2012-11-28: 20 mg via INTRAVENOUS
  Administered 2012-11-28: 30 mg via INTRAVENOUS
  Administered 2012-11-28: 20 mg via INTRAVENOUS
  Administered 2012-11-28: 10 mg via INTRAVENOUS
  Administered 2012-11-28 (×2): 20 mg via INTRAVENOUS
  Administered 2012-11-28: 10 mg via INTRAVENOUS
  Administered 2012-11-28 (×5): 20 mg via INTRAVENOUS
  Administered 2012-11-28: 10 mg via INTRAVENOUS
  Administered 2012-11-28: 20 mg via INTRAVENOUS
  Administered 2012-11-28: 50 mg via INTRAVENOUS

## 2012-11-28 MED ORDER — ETOMIDATE 2 MG/ML IV SOLN
INTRAVENOUS | Status: AC | PRN
  Administered 2012-11-28: 20 mg via INTRAVENOUS

## 2012-11-28 MED ORDER — DEXTROSE-NACL 5-0.45 % IV SOLN
INTRAVENOUS | Status: DC
  Administered 2012-11-28: 125 mL/h via INTRAVENOUS
  Administered 2012-11-29 – 2012-12-01 (×5): via INTRAVENOUS
  Administered 2012-12-01: 50 mL/h via INTRAVENOUS
  Administered 2012-12-03 – 2012-12-04 (×2): via INTRAVENOUS

## 2012-11-28 MED ORDER — PROPOFOL 10 MG/ML IV EMUL
INTRAVENOUS | Status: AC
  Administered 2012-11-28: 19.048 ug/kg/min via INTRAVENOUS
  Filled 2012-11-28: qty 100

## 2012-11-28 MED ORDER — 0.9 % SODIUM CHLORIDE (POUR BTL) OPTIME
TOPICAL | Status: DC | PRN
  Administered 2012-11-28 (×2): 1000 mL

## 2012-11-28 MED ORDER — LORAZEPAM 2 MG/ML IJ SOLN
0.0000 mg | Freq: Two times a day (BID) | INTRAMUSCULAR | Status: AC
  Filled 2012-11-28 (×2): qty 1

## 2012-11-28 MED ORDER — DEXTROSE-NACL 5-0.9 % IV SOLN: INTRAVENOUS | Status: DC

## 2012-11-28 MED ORDER — SODIUM CHLORIDE 0.9 % IV SOLN
1.0000 g | Freq: Once | INTRAVENOUS | Status: AC
  Administered 2012-11-28: 1 g via INTRAVENOUS
  Filled 2012-11-28: qty 10

## 2012-11-28 MED ORDER — PNEUMOCOCCAL VAC POLYVALENT 25 MCG/0.5ML IJ INJ
0.5000 mL | INJECTION | INTRAMUSCULAR | Status: AC
  Administered 2012-11-29: 0.5 mL via INTRAMUSCULAR
  Filled 2012-11-28: qty 0.5

## 2012-11-28 MED ORDER — SODIUM CHLORIDE 0.9 % IV SOLN
25.0000 ug/h | INTRAVENOUS | Status: DC
  Administered 2012-11-28: 50 ug/h via INTRAVENOUS
  Filled 2012-11-28: qty 50

## 2012-11-28 MED ORDER — ENOXAPARIN SODIUM 40 MG/0.4ML ~~LOC~~ SOLN
40.0000 mg | SUBCUTANEOUS | Status: DC
  Filled 2012-11-28 (×2): qty 0.4

## 2012-11-28 MED ORDER — PHENYLEPHRINE HCL 10 MG/ML IJ SOLN
10.0000 mg | INTRAVENOUS | Status: DC | PRN
  Administered 2012-11-28: 10 ug/min via INTRAVENOUS

## 2012-11-28 MED ORDER — SUCCINYLCHOLINE CHLORIDE 20 MG/ML IJ SOLN
INTRAMUSCULAR | Status: AC | PRN
  Administered 2012-11-28: 100 mg via INTRAVENOUS

## 2012-11-28 MED ORDER — SUFENTANIL CITRATE 50 MCG/ML IV SOLN
INTRAVENOUS | Status: DC | PRN
  Administered 2012-11-28: 15 ug via INTRAVENOUS
  Administered 2012-11-28: 5 ug via INTRAVENOUS
  Administered 2012-11-28: 15 ug via INTRAVENOUS
  Administered 2012-11-28 (×4): 10 ug via INTRAVENOUS
  Administered 2012-11-28: 15 ug via INTRAVENOUS
  Administered 2012-11-28: 10 ug via INTRAVENOUS

## 2012-11-28 MED ORDER — IOHEXOL 300 MG/ML  SOLN
100.0000 mL | Freq: Once | INTRAMUSCULAR | Status: AC | PRN
  Administered 2012-11-28: 100 mL via INTRAVENOUS

## 2012-11-28 MED ORDER — SODIUM CHLORIDE 0.9 % IV SOLN
INTRAVENOUS | Status: DC | PRN
  Administered 2012-11-28: 20:00:00 via INTRAVENOUS

## 2012-11-28 MED ORDER — THIAMINE HCL 100 MG/ML IJ SOLN
100.0000 mg | Freq: Every day | INTRAMUSCULAR | Status: DC
  Administered 2012-11-29 – 2012-11-30 (×2): 100 mg via INTRAVENOUS
  Filled 2012-11-28 (×6): qty 1

## 2012-11-28 MED ORDER — LORAZEPAM 2 MG/ML IJ SOLN: 1.0000 mg | Freq: Four times a day (QID) | INTRAMUSCULAR | Status: AC | PRN

## 2012-11-28 MED ORDER — PANTOPRAZOLE SODIUM 40 MG PO TBEC
40.0000 mg | DELAYED_RELEASE_TABLET | Freq: Every day | ORAL | Status: DC
  Administered 2012-12-01: 40 mg via ORAL
  Filled 2012-11-28: qty 1

## 2012-11-28 MED ORDER — LORAZEPAM 2 MG/ML IJ SOLN
0.0000 mg | Freq: Four times a day (QID) | INTRAMUSCULAR | Status: AC
  Filled 2012-11-28: qty 2

## 2012-11-28 MED ORDER — ONDANSETRON HCL 4 MG/2ML IJ SOLN: 4.0000 mg | Freq: Four times a day (QID) | INTRAMUSCULAR | Status: DC | PRN

## 2012-11-28 MED ORDER — FENTANYL CITRATE 0.05 MG/ML IJ SOLN
INTRAMUSCULAR | Status: DC | PRN
  Administered 2012-11-28 (×5): 50 ug via INTRAVENOUS
  Administered 2012-11-28 (×2): 100 ug via INTRAVENOUS
  Administered 2012-11-29: 50 ug via INTRAVENOUS

## 2012-11-28 MED ORDER — CEFAZOLIN SODIUM-DEXTROSE 2-3 GM-% IV SOLR
2.0000 g | Freq: Once | INTRAVENOUS | Status: AC
  Administered 2012-11-28: 2 g via INTRAVENOUS
  Filled 2012-11-28 (×2): qty 50

## 2012-11-28 MED ORDER — FENTANYL BOLUS VIA INFUSION
25.0000 ug | Freq: Four times a day (QID) | INTRAVENOUS | Status: DC | PRN
  Filled 2012-11-28: qty 100

## 2012-11-28 MED ORDER — LACTATED RINGERS IV SOLN
INTRAVENOUS | Status: DC | PRN
  Administered 2012-11-28 (×6): via INTRAVENOUS

## 2012-11-28 MED ORDER — ONDANSETRON HCL 4 MG PO TABS: 4.0000 mg | ORAL_TABLET | Freq: Four times a day (QID) | ORAL | Status: DC | PRN

## 2012-11-28 MED ORDER — CEFAZOLIN SODIUM 1-5 GM-% IV SOLN
INTRAVENOUS | Status: DC | PRN
  Administered 2012-11-28 (×3): 1 g via INTRAVENOUS

## 2012-11-28 SURGICAL SUPPLY — 157 items
2.8 LONG DRILL BIT ×5 IMPLANT
APPLIER CLIP 11 MED OPEN (CLIP) ×5
BANDAGE ELASTIC 3 VELCRO ST LF (GAUZE/BANDAGES/DRESSINGS) ×5 IMPLANT
BANDAGE ELASTIC 4 VELCRO ST LF (GAUZE/BANDAGES/DRESSINGS) IMPLANT
BANDAGE ELASTIC 6 VELCRO ST LF (GAUZE/BANDAGES/DRESSINGS) IMPLANT
BANDAGE ESMARK 6X9 LF (GAUZE/BANDAGES/DRESSINGS) IMPLANT
BANDAGE GAUZE ELAST BULKY 4 IN (GAUZE/BANDAGES/DRESSINGS) IMPLANT
BIT DRILL 2.8 QUICK RELEASE (BIT) ×4 IMPLANT
BIT DRILL 2.9 CANN QC NONSTRL (BIT) ×5 IMPLANT
BIT DRILL 4.3MMS DISTAL GRDTED (BIT) ×4 IMPLANT
BIT DRILL CALIBRATED 4.3MMX365 (DRILL) ×4 IMPLANT
BIT DRILL CROWE PNT TWST 4.5MM (DRILL) ×4 IMPLANT
BLADE SURG ROTATE 9660 (MISCELLANEOUS) ×5 IMPLANT
BNDG COHESIVE 6X5 TAN STRL LF (GAUZE/BANDAGES/DRESSINGS) IMPLANT
BNDG ESMARK 6X9 LF (GAUZE/BANDAGES/DRESSINGS)
BOLT EXTRACTION 3.0/4.0 (BOLT) ×5 IMPLANT
BOLT EXTRATION SCREW 4.5 (BOLT) ×5 IMPLANT
BRUSH SCRUB DISP (MISCELLANEOUS) ×20 IMPLANT
CLEANER TIP ELECTROSURG 2X2 (MISCELLANEOUS) IMPLANT
CLIP APPLIE 11 MED OPEN (CLIP) ×4 IMPLANT
CLOTH BEACON ORANGE TIMEOUT ST (SAFETY) ×5 IMPLANT
CORDS BIPOLAR (ELECTRODE) ×5 IMPLANT
COVER SURGICAL LIGHT HANDLE (MISCELLANEOUS) ×15 IMPLANT
CUFF TOURNIQUET SINGLE 18IN (TOURNIQUET CUFF) IMPLANT
CUFF TOURNIQUET SINGLE 24IN (TOURNIQUET CUFF) IMPLANT
CUFF TOURNIQUET SINGLE 34IN LL (TOURNIQUET CUFF) IMPLANT
DRAIN CHANNEL 10F 3/8 F FF (DRAIN) IMPLANT
DRAIN CHANNEL 15F RND FF W/TCR (WOUND CARE) IMPLANT
DRAPE C-ARM 42X72 X-RAY (DRAPES) ×10 IMPLANT
DRAPE C-ARMOR (DRAPES) ×5 IMPLANT
DRAPE INCISE IOBAN 66X45 STRL (DRAPES) ×5 IMPLANT
DRAPE INCISE IOBAN 85X60 (DRAPES) ×10 IMPLANT
DRAPE OEC MINIVIEW 54X84 (DRAPES) ×5 IMPLANT
DRAPE ORTHO SPLIT 77X108 STRL (DRAPES) ×4
DRAPE PROXIMA HALF (DRAPES) ×10 IMPLANT
DRAPE STERI IOBAN 125X83 (DRAPES) IMPLANT
DRAPE SURG ORHT 6 SPLT 77X108 (DRAPES) ×16 IMPLANT
DRAPE U-SHAPE 47X51 STRL (DRAPES) ×5 IMPLANT
DRILL 2.8 QUICK RELEASE (BIT) ×5
DRILL 4.3MMS DISTAL GRADUATED (BIT) ×5
DRILL CALIBRATED 4.3MMX365 (DRILL) ×5
DRILL CROWE POINT TWIST 4.5MM (DRILL) ×5
DRSG ADAPTIC 3X8 NADH LF (GAUZE/BANDAGES/DRESSINGS) IMPLANT
DRSG EMULSION OIL 3X3 NADH (GAUZE/BANDAGES/DRESSINGS) IMPLANT
DRSG MEPILEX BORDER 4X12 (GAUZE/BANDAGES/DRESSINGS) ×5 IMPLANT
DRSG MEPILEX BORDER 4X4 (GAUZE/BANDAGES/DRESSINGS) IMPLANT
DRSG MEPILEX BORDER 4X8 (GAUZE/BANDAGES/DRESSINGS) IMPLANT
DRSG MEPITEL 4X7.2 (GAUZE/BANDAGES/DRESSINGS) ×5 IMPLANT
DRSG PAD ABDOMINAL 8X10 ST (GAUZE/BANDAGES/DRESSINGS) IMPLANT
DRSG VAC ATS MED SENSATRAC (GAUZE/BANDAGES/DRESSINGS) ×5 IMPLANT
ELECT BLADE 4.0 EZ CLEAN MEGAD (MISCELLANEOUS) ×5
ELECT BLADE 6.5 EXT (BLADE) IMPLANT
ELECT REM PT RETURN 9FT ADLT (ELECTROSURGICAL) ×5
ELECTRODE BLDE 4.0 EZ CLN MEGD (MISCELLANEOUS) ×4 IMPLANT
ELECTRODE REM PT RTRN 9FT ADLT (ELECTROSURGICAL) ×4 IMPLANT
EVACUATOR 1/8 PVC DRAIN (DRAIN) IMPLANT
EVACUATOR SILICONE 100CC (DRAIN) IMPLANT
GAUZE SPONGE 4X4 16PLY XRAY LF (GAUZE/BANDAGES/DRESSINGS) IMPLANT
GAUZE XEROFORM 1X8 LF (GAUZE/BANDAGES/DRESSINGS) ×5 IMPLANT
GLOVE BIO SURGEON STRL SZ 6.5 (GLOVE) ×40 IMPLANT
GLOVE BIO SURGEON STRL SZ7.5 (GLOVE) ×30 IMPLANT
GLOVE BIO SURGEON STRL SZ8 (GLOVE) ×20 IMPLANT
GLOVE BIOGEL PI IND STRL 6.5 (GLOVE) ×16 IMPLANT
GLOVE BIOGEL PI IND STRL 7.5 (GLOVE) ×8 IMPLANT
GLOVE BIOGEL PI IND STRL 8 (GLOVE) ×28 IMPLANT
GLOVE BIOGEL PI INDICATOR 6.5 (GLOVE) ×4
GLOVE BIOGEL PI INDICATOR 7.5 (GLOVE) ×2
GLOVE BIOGEL PI INDICATOR 8 (GLOVE) ×7
GLOVE ECLIPSE 6.5 STRL STRAW (GLOVE) ×10 IMPLANT
GOWN PREVENTION PLUS XLARGE (GOWN DISPOSABLE) ×5 IMPLANT
GOWN PREVENTION PLUS XXLARGE (GOWN DISPOSABLE) ×5 IMPLANT
GOWN STRL NON-REIN LRG LVL3 (GOWN DISPOSABLE) ×20 IMPLANT
GUIDEWIRE BALL NOSE 100CM (WIRE) ×5 IMPLANT
GUIDEWIRE BEAD TIP (WIRE) ×5 IMPLANT
HANDPIECE INTERPULSE COAX TIP (DISPOSABLE)
HIP FRA NAIL LAG SCREW 10.5X90 (Orthopedic Implant) ×5 IMPLANT
K-WIRE .045X45 (WIRE) ×5 IMPLANT
K-WIRE ACE 1.6X6 (WIRE) ×5
KIT BASIN OR (CUSTOM PROCEDURE TRAY) ×5 IMPLANT
KIT ROOM TURNOVER OR (KITS) ×10 IMPLANT
KWIRE ACE 1.6X6 (WIRE) ×4 IMPLANT
LIGHT ORTHO (MISCELLANEOUS) ×10 IMPLANT
LOOP VESSEL MAXI BLUE (MISCELLANEOUS) IMPLANT
MANIFOLD NEPTUNE II (INSTRUMENTS) IMPLANT
NAIL HIP FRACTURE 11X380MM (Nail) ×5 IMPLANT
NEEDLE 22X1 1/2 (OR ONLY) (NEEDLE) IMPLANT
NEEDLE MAYO TROCAR (NEEDLE) IMPLANT
NS IRRIG 1000ML POUR BTL (IV SOLUTION) ×5 IMPLANT
PACK GENERAL/GYN (CUSTOM PROCEDURE TRAY) ×5 IMPLANT
PACK ORTHO EXTREMITY (CUSTOM PROCEDURE TRAY) ×10 IMPLANT
PACK TOTAL JOINT (CUSTOM PROCEDURE TRAY) ×5 IMPLANT
PAD ARMBOARD 7.5X6 YLW CONV (MISCELLANEOUS) ×10 IMPLANT
PAD CAST 3X4 CTTN HI CHSV (CAST SUPPLIES) ×4 IMPLANT
PAD CAST 4YDX4 CTTN HI CHSV (CAST SUPPLIES) ×4 IMPLANT
PAD NEG PRESSURE SENSATRAC (MISCELLANEOUS) ×5 IMPLANT
PADDING CAST COTTON 3X4 STRL (CAST SUPPLIES) ×1
PADDING CAST COTTON 4X4 STRL (CAST SUPPLIES) ×1
PADDING CAST COTTON 6X4 STRL (CAST SUPPLIES) IMPLANT
PLATE INTRAPELVIC 9H RIGHT (Plate) ×5 IMPLANT
PLATE RECONSTRUCTION 10H 3.5MM (Plate) ×5 IMPLANT
RETRIEVER SUT HEWSON (MISCELLANEOUS) IMPLANT
SCREW BONE CORTICAL 5.0X38 (Screw) ×5 IMPLANT
SCREW CORTICAL 3.5X20MM (Screw) ×5 IMPLANT
SCREW HEX NON LOCK 3.5X26MM (Screw) ×5 IMPLANT
SCREW HEX NON LOCK 3.5X32MM (Screw) ×5 IMPLANT
SCREW HEX NON LOCK 3.5X34MM (Screw) ×5 IMPLANT
SCREW HEX NON LOCK 3.5X36MM (Screw) ×5 IMPLANT
SCREW LAG  RD HEAD 4.0 36 LTH (Screw) ×1 IMPLANT
SCREW LAG  RD HEAD 4.0 38 LTH (Screw) ×1 IMPLANT
SCREW LAG  RD HEAD 4.0 42 LTH (Screw) ×1 IMPLANT
SCREW LAG HIP FRA NAIL 10.5X90 (Orthopedic Implant) ×4 IMPLANT
SCREW LAG RD HEAD 4.0 36 LTH (Screw) ×4 IMPLANT
SCREW LAG RD HEAD 4.0 38 LTH (Screw) ×4 IMPLANT
SCREW LAG RD HEAD 4.0 42 LTH (Screw) ×4 IMPLANT
SCREW NON LOCK 3.5X40 (Screw) ×5 IMPLANT
SCREW NON LOCKING HEX 3.5X18MM (Screw) ×5 IMPLANT
SCREW NON LOCKING HEX 3.5X22 (Screw) ×5 IMPLANT
SCREW NON LOCKING HEX 3.5X24 (Screw) ×5 IMPLANT
SCREW NON LOCKING HEX 3.5X50MM (Screw) ×5 IMPLANT
SCREW NONLOCKING 3.5X28MM (Screw) ×10 IMPLANT
SET HNDPC FAN SPRY TIP SCT (DISPOSABLE) IMPLANT
SLEEVE SURGEON STRL (DRAPES) ×5 IMPLANT
SPLINT FIBERGLASS 3X35 (CAST SUPPLIES) ×5 IMPLANT
SPONGE GAUZE 4X4 12PLY (GAUZE/BANDAGES/DRESSINGS) IMPLANT
SPONGE LAP 18X18 X RAY DECT (DISPOSABLE) ×20 IMPLANT
SPONGE SCRUB IODOPHOR (GAUZE/BANDAGES/DRESSINGS) IMPLANT
STAPLER VISISTAT 35W (STAPLE) ×15 IMPLANT
STOCKINETTE IMPERVIOUS LG (DRAPES) ×5 IMPLANT
STRIP CLOSURE SKIN 1/2X4 (GAUZE/BANDAGES/DRESSINGS) IMPLANT
SUCTION FRAZIER TIP 10 FR DISP (SUCTIONS) ×5 IMPLANT
SUT CHROMIC 5 0 P 3 (SUTURE) ×5 IMPLANT
SUT ETHILON 3 0 PS 1 (SUTURE) ×5 IMPLANT
SUT FIBERWIRE #2 38 T-5 BLUE (SUTURE)
SUT FIBERWIRE 3-0 18 TAPR NDL (SUTURE) ×5
SUT FIBERWIRE 4-0 18 TAPR NDL (SUTURE) ×5
SUT PDS AB 2-0 CT1 27 (SUTURE) IMPLANT
SUT PROLENE 4 0 PS 2 18 (SUTURE) ×10 IMPLANT
SUT VIC AB 0 CT1 27 (SUTURE) ×3
SUT VIC AB 0 CT1 27XBRD ANBCTR (SUTURE) ×12 IMPLANT
SUT VIC AB 1 CT1 18XCR BRD 8 (SUTURE) ×4 IMPLANT
SUT VIC AB 1 CT1 27 (SUTURE)
SUT VIC AB 1 CT1 27XBRD ANBCTR (SUTURE) IMPLANT
SUT VIC AB 1 CT1 8-18 (SUTURE) ×1
SUT VIC AB 2-0 CT1 27 (SUTURE) ×3
SUT VIC AB 2-0 CT1 TAPERPNT 27 (SUTURE) ×12 IMPLANT
SUTURE FIBERWR #2 38 T-5 BLUE (SUTURE) IMPLANT
SUTURE FIBERWR 3-0 18 TAPR NDL (SUTURE) ×4 IMPLANT
SUTURE FIBERWR 4-0 18 TAPR NDL (SUTURE) ×4 IMPLANT
SYR CONTROL 10ML LL (SYRINGE) IMPLANT
TOWEL OR 17X24 6PK STRL BLUE (TOWEL DISPOSABLE) ×10 IMPLANT
TOWEL OR 17X26 10 PK STRL BLUE (TOWEL DISPOSABLE) ×10 IMPLANT
TRAY FOLEY CATH 14FR (SET/KITS/TRAYS/PACK) IMPLANT
TUBE CONNECTING 12X1/4 (SUCTIONS) ×5 IMPLANT
TUBE SPARE REAMER (TUBING) ×5 IMPLANT
UNDERPAD 30X30 INCONTINENT (UNDERPADS AND DIAPERS) ×15 IMPLANT
WATER STERILE IRR 1000ML POUR (IV SOLUTION) IMPLANT
YANKAUER SUCT BULB TIP NO VENT (SUCTIONS) ×5 IMPLANT

## 2012-11-28 NOTE — ED Notes (Signed)
Chaplain tried to call family but no response.
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
## 2012-11-28 NOTE — Progress Notes (Signed)
Patient ID: [name], male   DOB: 1966-01-30, 47 y.o.   MRN: [id number] I spoke with Dr. Casimiro Needle handy this morning  And he is going to take over the care of this patient.  We reviewed the case thoroughly and discussed the possible options for care and he is obviously the appropriate position to take on the care of his acetabulum and he was kind enough to take on the care of his femur as well.  I informed the operating room and the nurses in the intensive care unit of the transfer of care.
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
## 2012-11-28 NOTE — OR Nursing (Signed)
Pt repositioned for hand surgery. Left hand placed on extremity table at 2250. Prepped with betadine scrub and paint by Timoteo Expose, RN.

## 2012-11-28 NOTE — ED Notes (Signed)
Vital signs stable. 
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
## 2012-11-28 NOTE — Brief Op Note (Signed)
11/28/2012  10:36 PM   PATIENT:  [name]  47 y.o. male  PRE-OPERATIVE DIAGNOSIS:  Fractured femur, transverse anterior column right acetabulum, retained right hip hardware, right patella fracture, left thumb MCP dislocation, right Morelle-Lavalle  POST-OPERATIVE DIAGNOSIS:  Fractured femur, transverse anterior column right acetabulum, retained right hip hardware, right patella fracture, left thumb MCP dislocation, right Morelle-Lavalle  PROCEDURE:  Procedure(s) with comments: 1. OPEN REDUCTION INTERNAL FIXATION (ORIF) ACETABULAR FRACTURE (Right) - ended at 2233 2. Removal of DHS screw (Right) 3. INTRAMEDULLARY (IM) NAIL FEMORAL , antegrade (Right) [employer] 4. OPEN REDUCTION INTERNAL (ORIF) FIXATION PATELLA (Right) 5. APPLICATION OF WOUND VAC (Right) 6. Stress flouro left thumb MCP joint  7. ULNAR COLLATERAL LIGAMENT REPAIR (Left) thumb- Dr. Amanda Pea  SURGEON:  Surgeon(s) and Role: Panel 1:    * Budd Palmer, MD - Primary  Panel 2:    * Dominica Severin, MD - Primary  PHYSICIAN ASSISTANT: Montez Morita, [facility]  ANESTHESIA:   general  EBL:  Total I/O In: 4550 [I.V.:3500; Blood:1050] Out: 575 [Urine:175; Blood:400]  BLOOD ADMINISTERED:none  DRAINS: none   LOCAL MEDICATIONS USED:  NONE  SPECIMEN:  No Specimen  DISPOSITION OF SPECIMEN:  N/A  COUNTS:  YES  TOURNIQUET:  * No tourniquets in log *  DICTATION: .Other Dictation: Dictation Number [id number]  PLAN OF CARE: Admit to inpatient   PATIENT DISPOSITION:  Remained in OR   Delay start of Pharmacological VTE agent (>24hrs) due to surgical blood loss or risk of bleeding: no
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
## 2012-11-28 NOTE — OR Nursing (Signed)
Distal Femur traction pin removed from right distal femur by Montez Morita PA intraoperatively; area prepped with Betadine solution prior to removal.  Also noted instability of left thumb with deep laceration intraoperatively; Dr. Carola Frost notified and Dr. Amanda Pea consulted.

## 2012-11-28 NOTE — Progress Notes (Signed)
Currently sedated on the ventilator.  Will go to surgery later today.  Not sure how neurologically impaired he will be, if at all.  This patient has been seen and I agree with the findings and treatment plan.  Marta Lamas. Gae Bon, MD, FACS [phone] (pager) [phone] (direct pager) Trauma Surgeon

## 2012-11-29 ENCOUNTER — Inpatient Hospital Stay (HOSPITAL_COMMUNITY): Payer: Medicaid Other

## 2012-11-29 DIAGNOSIS — IMO0002 Reserved for concepts with insufficient information to code with codable children: Secondary | ICD-10-CM

## 2012-11-29 DIAGNOSIS — T07XXXA Unspecified multiple injuries, initial encounter: Secondary | ICD-10-CM

## 2012-11-29 LAB — POCT I-STAT 4, (NA,K, GLUC, HGB,HCT)
Glucose, Bld: 108 mg/dL — ABNORMAL HIGH (ref 70–99)
Glucose, Bld: 134 mg/dL — ABNORMAL HIGH (ref 70–99)
Glucose, Bld: 135 mg/dL — ABNORMAL HIGH (ref 70–99)
HCT: 20 % — ABNORMAL LOW (ref 39.0–52.0)
HCT: 28 % — ABNORMAL LOW (ref 39.0–52.0)
HCT: 28 % — ABNORMAL LOW (ref 39.0–52.0)
Hemoglobin: 6.8 g/dL — CL (ref 13.0–17.0)
Hemoglobin: 9.5 g/dL — ABNORMAL LOW (ref 13.0–17.0)
Potassium: 3.6 meq/L (ref 3.5–5.1)
Potassium: 3.7 meq/L (ref 3.5–5.1)
Potassium: 3.7 meq/L (ref 3.5–5.1)
Potassium: 3.7 meq/L (ref 3.5–5.1)
Potassium: 3.8 meq/L (ref 3.5–5.1)
Sodium: 144 meq/L (ref 135–145)
Sodium: 145 meq/L (ref 135–145)

## 2012-11-29 LAB — CBC
HCT: 30.7 % — ABNORMAL LOW (ref 39.0–52.0)
Hemoglobin: 10.8 g/dL — ABNORMAL LOW (ref 13.0–17.0)
MCHC: 35.2 g/dL (ref 30.0–36.0)
RBC: 3.67 MIL/uL — ABNORMAL LOW (ref 4.22–5.81)

## 2012-11-29 LAB — TYPE AND SCREEN
ABO/RH(D): O POS
Unit division: 0

## 2012-11-29 LAB — POCT I-STAT 3, ART BLOOD GAS (G3+)
O2 Saturation: 100 %
Patient temperature: 97.8
Patient temperature: 100.3
pCO2 arterial: 37.5 mmHg (ref 35.0–45.0)
pCO2 arterial: 41.8 mmHg (ref 35.0–45.0)
pH, Arterial: 7.389 (ref 7.350–7.450)
pH, Arterial: 7.408 (ref 7.350–7.450)

## 2012-11-29 LAB — BASIC METABOLIC PANEL
BUN: 9 mg/dL (ref 6–23)
CO2: 23 mEq/L (ref 19–32)
Chloride: 112 mEq/L (ref 96–112)
GFR calc non Af Amer: >90 mL/min (ref >90)
Glucose, Bld: 173 mg/dL — ABNORMAL HIGH (ref 70–99)
Potassium: 3.6 mEq/L (ref 3.5–5.1)
Sodium: 142 mEq/L (ref 135–145)

## 2012-11-29 LAB — POCT I-STAT 7, (LYTES, BLD GAS, ICA,H+H)
Acid-Base Excess: 1 mmol/L (ref 0.0–2.0)
Calcium, Ion: 1.11 mmol/L — ABNORMAL LOW (ref 1.12–1.23)
O2 Saturation: 100 %
pO2, Arterial: 528 mmHg — ABNORMAL HIGH (ref 80.0–100.0)

## 2012-11-29 MED ORDER — CEFAZOLIN SODIUM 1-5 GM-% IV SOLN
1.0000 g | Freq: Three times a day (TID) | INTRAVENOUS | Status: AC
  Administered 2012-11-29 – 2012-11-30 (×6): 1 g via INTRAVENOUS
  Filled 2012-11-29 (×6): qty 50

## 2012-11-29 MED ORDER — ENOXAPARIN SODIUM 40 MG/0.4ML ~~LOC~~ SOLN
40.0000 mg | SUBCUTANEOUS | Status: DC
  Administered 2012-11-29 – 2012-11-30 (×2): 40 mg via SUBCUTANEOUS
  Filled 2012-11-29 (×3): qty 0.4

## 2012-11-29 MED ORDER — HYDROMORPHONE HCL PF 1 MG/ML IJ SOLN
1.0000 mg | INTRAMUSCULAR | Status: DC | PRN
  Administered 2012-11-29 – 2012-12-01 (×10): 1 mg via INTRAVENOUS
  Filled 2012-11-29 (×11): qty 1

## 2012-11-29 MED ORDER — ONDANSETRON HCL 4 MG/2ML IJ SOLN: 4.0000 mg | Freq: Four times a day (QID) | INTRAMUSCULAR | Status: DC | PRN

## 2012-11-29 MED ORDER — ONDANSETRON HCL 4 MG PO TABS: 4.0000 mg | ORAL_TABLET | Freq: Four times a day (QID) | ORAL | Status: DC | PRN

## 2012-11-29 MED ORDER — METOCLOPRAMIDE HCL 5 MG PO TABS
5.0000 mg | ORAL_TABLET | Freq: Three times a day (TID) | ORAL | Status: DC | PRN
  Filled 2012-11-29: qty 2

## 2012-11-29 MED ORDER — METOCLOPRAMIDE HCL 5 MG/ML IJ SOLN
5.0000 mg | Freq: Three times a day (TID) | INTRAMUSCULAR | Status: DC | PRN
  Filled 2012-11-29: qty 2

## 2012-11-29 NOTE — Progress Notes (Signed)
Dr. Lindie Spruce was contacted to provide him an update on the progress of weaning the pt.  Pt has been off both of his drips - fentanyl and propofol.  Pt has been weaning on CPAP for over six hours.  He does follow commands, however, does fall back asleep easily.  Order was provided for pain management via fentanyl bolus.
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
## 2012-11-29 NOTE — Transfer of Care (Signed)
Immediate Anesthesia Transfer of Care Note  Patient: [name]  Procedure(s) Performed: Procedure(s) with comments: OPEN REDUCTION INTERNAL FIXATION (ORIF) ACETABULAR FRACTURE (Right) - ended at 2233 Removal of DHS screw (Right) INTRAMEDULLARY (IM) NAIL FEMORAL , antegrade (Right) OPEN REDUCTION INTERNAL (ORIF) FIXATION PATELLA (Right) ULNAR COLLATERAL LIGAMENT REPAIR with percutaneous pinning of thumb (Left) APPLICATION OF WOUND VAC (Right)  Patient Location: SICU  Anesthesia Type:General  Level of Consciousness: Patient remains intubated per anesthesia plan  Airway & Oxygen Therapy: Patient remains intubated per anesthesia plan and Patient placed on Ventilator (see vital sign flow sheet for setting)  Post-op Assessment: Report given to PACU RN and Post -op Vital signs reviewed and stable  Post vital signs: Reviewed and stable  Complications: No apparent anesthesia complications

## 2012-11-29 NOTE — Progress Notes (Signed)
On sedation.  Should be able to wean to extubation.  Will check ABG now.  This patient has been seen and I agree with the findings and treatment plan.  Marta Lamas. Gae Bon, MD, FACS [phone] (pager) [phone] (direct pager) Trauma Surgeon

## 2012-11-29 NOTE — Progress Notes (Signed)
Pt continues to remain stable post extubation.  ABG parameters on follow-up ABG were all WNL.  Pt is on 4L N/C and the PO2 on the ABG was 189.

## 2012-11-29 NOTE — Anesthesia Preprocedure Evaluation (Signed)
Anesthesia Evaluation   Patient unresponsive    Reviewed: Unable to perform ROS - Chart review only  Airway       Dental   Pulmonary          Cardiovascular     Neuro/Psych    GI/Hepatic   Endo/Other    Renal/GU      Musculoskeletal   Abdominal   Peds  Hematology   Anesthesia Other Findings   Reproductive/Obstetrics                           Anesthesia Physical Anesthesia Plan  ASA: IV and emergent  Anesthesia Plan: General   Post-op Pain Management:    Induction: Intravenous  Airway Management Planned: Oral ETT  Additional Equipment: Arterial line  Intra-op Plan:   Post-operative Plan: Post-operative intubation/ventilation  Informed Consent:   Only emergency history available  Plan Discussed with: Anesthesiologist and Surgeon  Anesthesia Plan Comments:         Anesthesia Quick Evaluation

## 2012-11-29 NOTE — Progress Notes (Signed)
Wean to extubated This patient has been seen and I agree with the findings and treatment plan.  Marta Lamas. Gae Bon, MD, FACS [phone] (pager) [phone] (direct pager) Trauma Surgeon
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
## 2012-11-29 NOTE — Progress Notes (Signed)
Subjective: 1 Day Post-Op Procedure(s) (LRB): OPEN REDUCTION INTERNAL FIXATION (ORIF) ACETABULAR FRACTURE (Right) Removal of DHS screw (Right) INTRAMEDULLARY (IM) NAIL FEMORAL , antegrade (Right) OPEN REDUCTION INTERNAL (ORIF) FIXATION PATELLA (Right) ULNAR COLLATERAL LIGAMENT REPAIR with percutaneous pinning of thumb (Left) APPLICATION OF WOUND VAC (Right) Patient is intubated. Nurses at bedside. He is been stable throughout the night. Was a bit combative when he first came to the ICU. He is sedated.  His splint is intact about the left upper extremity. Secondary survey of the upper extremities is difficult due to the fact that he is sedated and intubated. Given the sedation issues he is not following commands  Objective: Vital signs in last 24 hours: Temp:  [97.8 F (36.6 C)-98.9 F (37.2 C)] 98.9 F (37.2 C) (07/04 0700) Pulse Rate:  [62-94] 88 (07/04 0900) Resp:  [16-18] 16 (07/04 0900) BP: (96-124)/(59-91) 113/73 mmHg (07/04 0900) SpO2:  [100 %] 100 % (07/04 0900) Arterial Line BP: (101-139)/(55-74) 130/69 mmHg (07/04 0900) FiO2 (%):  [40 %] 40 % (07/04 0900)  Intake/Output from previous day: 07/03 0701 - 07/04 0700 In: 10641.3 [I.V.:8771.3; Blood:1050; NG/GT:60; IV Piggyback:760] Out: 1955 [Urine:1055; Blood:900] Intake/Output this shift: Total I/O In: 323.6 [I.V.:293.6; NG/GT:30] Out: 60 [Urine:60]   Recent Labs  11/28/12 1806 11/28/12 2009 11/28/12 2118 11/28/12 2345 11/29/12 0415  HGB 9.2* 6.8* 8.5* 9.5* 10.8*    Recent Labs  11/28/12 0225  11/28/12 2345 11/29/12 0415  WBC 15.9*  --   --  7.8  RBC 4.04*  --   --  3.67*  HCT 35.8*  < > 28.0* 30.7*  PLT 235  --   --  124*  < > = values in this interval not displayed.  Recent Labs  11/28/12 0225  11/28/12 2345 11/29/12 0415  NA 145  < > 144 142  K 4.8  < > 3.8 3.6  CL 114*  --   --  112  CO2 24  --   --  23  BUN 12  --   --  9  CREATININE 1.70*  --   --  0.97  GLUCOSE 114*  < > 135* 173*   CALCIUM 7.5*  --   --  6.9*  < > = values in this interval not displayed.  Recent Labs  11/28/12 0920  INR 1.10   Physical exam: He has IV access in his right upper extremity. There is generalized swelling in the right upper extremity but no palpable defects. He will need a secondary survey. His left upper extremity has a splint in place. I should note that he has intact refill to the pulp tissue but in general both right and left hands half some degree of coolness . I do not see any signs of vascular compromise in the hands when I examined both the right and left hands. The thumb spica splint is in good repair. There is no cyanosis or tight compartments noted. I reviewed his length. I discussed his care to the nursing staff.  No cellulitis present Compartment soft  Assessment/Plan: 1 Day Post-Op Procedure(s) (LRB): OPEN REDUCTION INTERNAL FIXATION (ORIF) ACETABULAR FRACTURE (Right) Removal of DHS screw (Right) INTRAMEDULLARY (IM) NAIL FEMORAL , antegrade (Right) OPEN REDUCTION INTERNAL (ORIF) FIXATION PATELLA (Right) ULNAR COLLATERAL LIGAMENT REPAIR with percutaneous pinning of thumb (Left) APPLICATION OF WOUND VAC (Right)   Patient is stable status post reconstruction left thumb. We'll continue immobilization and splint. His thumb was highly unstable and those will plan for a prolonged period of  immobilization. I do not see any signs of excessive tension compartment syndrome or other problems. We will simply wait watch and see how he does in the ensuing days.  In the best case scenario he will have some degree of significant periarticular arthrofibrosis about the MCP joint given the tearing injury to his extensor apparatus ulnar collateral ligament volar plate and abductor. This was not a simple ulnar collateral ligament tear but more of a severe dislocation with soft tissue injury encompassing the entire ulna and dorsal as well as volar aspects of the thumb MCP joint  I will be away  Saturday but my partners degrees orthopedics are available for any needs for emergencies.  If there any questions I can be reached in my cellular phone-[phone]    Karen Chafe 11/29/2012, 9:23 AM
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
## 2012-11-29 NOTE — Progress Notes (Signed)
Patient transported to CT and back on vent by Rosalie Doctor, RRT.  No complications.

## 2012-11-29 NOTE — Progress Notes (Signed)
Resp Care Note :Pt self extubated,BBS clear and equal,all vitals stable.Place pt on 4lpm [place].MD notified.

## 2012-11-29 NOTE — Progress Notes (Signed)
Patient ID: [name], male   DOB: 1965-08-17, 47 y.o.   MRN: [id number] See Dictation # [id number] Dominica Severin MD

## 2012-11-29 NOTE — Anesthesia Postprocedure Evaluation (Signed)
Anesthesia Post Note  Patient: Joakim Huesman Scallon  Procedure(s) Performed: Procedure(s) (LRB): OPEN REDUCTION INTERNAL FIXATION (ORIF) ACETABULAR FRACTURE (Right) Removal of DHS screw (Right) INTRAMEDULLARY (IM) NAIL FEMORAL , antegrade (Right) OPEN REDUCTION INTERNAL (ORIF) FIXATION PATELLA (Right) ULNAR COLLATERAL LIGAMENT REPAIR with percutaneous pinning of thumb (Left) APPLICATION OF WOUND VAC (Right)  Anesthesia type: General  Patient location: ICU  Post pain: Pain level controlled  Post assessment: Post-op Vital signs reviewed  Last Vitals:  Filed Vitals:   11/29/12 0400  BP: 120/83  Pulse: 87  Temp: 36.6 C  Resp: 17    Post vital signs: stable  Level of consciousness: Patient remains intubated per anesthesia plan  Complications: No apparent anesthesia complications

## 2012-11-29 NOTE — Progress Notes (Signed)
Dr. Lindie Spruce called and advised that pt self extubated.  Order was provided to discontinue fentanyl boluses and manage pain with 1-2 mg dilaudid IV q2 hours prn.

## 2012-11-30 LAB — CBC
MCH: 29.6 pg (ref 26.0–34.0)
MCHC: 34.7 g/dL (ref 30.0–36.0)
RDW: 14.8 % (ref 11.5–15.5)

## 2012-11-30 NOTE — Plan of Care (Signed)
Problem: Phase II Progression Outcomes Goal: Date pt extubated/weaned off vent Outcome: Completed/Met Date Met:  11/30/12 Self-extubated 7/4 1920
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
## 2012-11-30 NOTE — Progress Notes (Signed)
Appreciate Ortho/ Face consults Sleepy, but arousable Continue foley for at least one more day  Wilmon Arms. Corliss Skains, MD, [facility] Surgery  General/ Trauma Surgery  11/30/2012 9:14 AM
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
## 2012-11-30 NOTE — Progress Notes (Signed)
Orthopedic Tech Progress Note Patient Details:  [name] 1965-06-14 [id number]  Patient ID: [name], male   DOB: February 05, 1966, 47 y.o.   MRN: [id number] Informed RN that pt will need hill rom bed in order to accept trapeze bar patient helper; RN stated that 2300 staff will notify ortho when that transition takes place  Nikki Dom 11/30/2012, 1:29 PM

## 2012-12-01 ENCOUNTER — Encounter (HOSPITAL_COMMUNITY): Payer: Self-pay | Admitting: Plastic Surgery

## 2012-12-01 DIAGNOSIS — S060X9A Concussion with loss of consciousness of unspecified duration, initial encounter: Secondary | ICD-10-CM

## 2012-12-01 LAB — CBC
HCT: 24.2 % — ABNORMAL LOW (ref 39.0–52.0)
Hemoglobin: 8.4 g/dL — ABNORMAL LOW (ref 13.0–17.0)
MCHC: 34.7 g/dL (ref 30.0–36.0)
RDW: 13.9 % (ref 11.5–15.5)
WBC: 8.6 10*3/uL (ref 4.0–10.5)

## 2012-12-01 MED ORDER — LORAZEPAM 1 MG PO TABS: 1.0000 mg | ORAL_TABLET | Freq: Four times a day (QID) | ORAL | Status: AC | PRN

## 2012-12-01 MED ORDER — DOUBLE ANTIBIOTIC 500-10000 UNIT/GM EX OINT
TOPICAL_OINTMENT | Freq: Two times a day (BID) | CUTANEOUS | Status: DC
  Filled 2012-12-01 (×18): qty 1

## 2012-12-01 MED ORDER — DOUBLE ANTIBIOTIC 500-10000 UNIT/GM EX OINT
TOPICAL_OINTMENT | Freq: Two times a day (BID) | CUTANEOUS | Status: DC
  Administered 2012-12-02 – 2012-12-03 (×2): via TOPICAL
  Administered 2012-12-03: 1 via TOPICAL
  Filled 2012-12-01: qty 14.17

## 2012-12-01 MED ORDER — ADULT MULTIVITAMIN W/MINERALS CH
1.0000 | ORAL_TABLET | Freq: Every day | ORAL | Status: DC
  Administered 2012-12-01 – 2012-12-05 (×4): 1 via ORAL
  Filled 2012-12-01 (×5): qty 1

## 2012-12-01 MED ORDER — VITAMIN B-1 100 MG PO TABS: 100.0000 mg | ORAL_TABLET | Freq: Every day | ORAL | Status: DC

## 2012-12-01 MED ORDER — HYDROMORPHONE HCL PF 1 MG/ML IJ SOLN
1.0000 mg | INTRAMUSCULAR | Status: DC | PRN
  Administered 2012-12-01 – 2012-12-02 (×5): 1 mg via INTRAVENOUS
  Filled 2012-12-01 (×5): qty 1

## 2012-12-01 MED ORDER — LORAZEPAM 2 MG/ML IJ SOLN
1.0000 mg | Freq: Four times a day (QID) | INTRAMUSCULAR | Status: DC | PRN
  Administered 2012-12-01: 1 mg via INTRAVENOUS

## 2012-12-01 MED ORDER — FOLIC ACID 1 MG PO TABS
1.0000 mg | ORAL_TABLET | Freq: Every day | ORAL | Status: DC
  Administered 2012-12-01 – 2012-12-05 (×4): 1 mg via ORAL
  Filled 2012-12-01 (×5): qty 1

## 2012-12-01 MED ORDER — THIAMINE HCL 100 MG/ML IJ SOLN: 100.0000 mg | Freq: Every day | INTRAMUSCULAR | Status: DC

## 2012-12-01 NOTE — Progress Notes (Signed)
Trauma Service Note  Subjective: Patient is talking about getting his ID care and his SS card and getting out of here.  Does not appear to have done much with PT/OT  Objective: Vital signs in last 24 hours: Temp:  [97.4 F (36.3 C)-99.2 F (37.3 C)] 99.2 F (37.3 C) (07/06 0750) Pulse Rate:  [80-114] 87 (07/06 0800) Resp:  [13-21] 15 (07/06 0800) BP: (123-143)/(64-90) 126/77 mmHg (07/06 0800) SpO2:  [96 %-100 %] 100 % (07/06 0800) Arterial Line BP: (114-158)/(58-84) 146/69 mmHg (07/06 0800)    Intake/Output from previous day: 07/05 0701 - 07/06 0700 In: 3000 [I.V.:2950; IV Piggyback:50] Out: 4040 [Urine:4040] Intake/Output this shift: Total I/O In: 125 [I.V.:125] Out: 400 [Urine:400]  General: No acute distress.  Lungs: Clear to auscultation  Abd: Soft, nontender, good bowel sounds.  Has been tolerating liquid diet well  Extremities: RLE in immobilizer.  Neuro: Intact, but seems a bit confused.  Lab Results: CBC   Recent Labs  11/30/12 0430 12/01/12 0400  WBC 9.2 8.6  HGB 9.2* 8.4*  HCT 26.5* 24.2*  PLT 108* 124*   BMET  Recent Labs  11/28/12 2345 11/29/12 0415  NA 144 142  K 3.8 3.6  CL  --  112  CO2  --  23  GLUCOSE 135* 173*  BUN  --  9  CREATININE  --  0.97  CALCIUM  --  6.9*   PT/INR  Recent Labs  11/28/12 0920  LABPROT 14.0  INR 1.10   ABG  Recent Labs  11/29/12 1219 11/29/12 1958  PHART 7.389 7.408  HCO3 22.8 26.1*    Studies/Results: No results found.  Anti-infectives: Anti-infectives   Start     Dose/Rate Route Frequency Ordered Stop   11/29/12 0050  ceFAZolin (ANCEF) IVPB 1 g/50 mL premix     1 g 100 mL/hr over 30 Minutes Intravenous 3 times per day 11/29/12 0051 11/30/12 1441   11/28/12 0930  [MAR Hold]  ceFAZolin (ANCEF) IVPB 2 g/50 mL premix     (On MAR Hold since 11/28/12 1229)   2 g 100 mL/hr over 30 Minutes Intravenous  Once 11/28/12 0927 11/28/12 1230      Assessment/Plan: s/p Procedure(s): OPEN  REDUCTION INTERNAL FIXATION (ORIF) ACETABULAR FRACTURE Removal of DHS screw INTRAMEDULLARY (IM) NAIL FEMORAL , antegrade OPEN REDUCTION INTERNAL (ORIF) FIXATION PATELLA ULNAR COLLATERAL LIGAMENT REPAIR with percutaneous pinning of thumb APPLICATION OF WOUND VAC d/c foley Advance diet Transfer from the ICU Maxillofacial surgeon has not been able to catch up to this patient and will see the patient today.  Not likely to require surgery, but we do not know for sure.  Still has C-collar in place.  Will check studies already done and see if he needs flexion-extension views.  LOS: 3 days   Marta Lamas. Gae Bon, MD, FACS [phone] Trauma Surgeon 12/01/2012
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
## 2012-12-01 NOTE — Progress Notes (Signed)
Ortho MD re-paged.

## 2012-12-01 NOTE — Progress Notes (Signed)
Orthopedic Tech Progress Note Patient Details:  [name] 07-Dec-1965 [id number] OHF applied to bed Patient ID: [name], male   DOB: 12-01-1965, 47 y.o.   MRN: [id number]   [name] 12/01/2012, 12:27 PM

## 2012-12-01 NOTE — Progress Notes (Addendum)
Patient attempting to remove SCD and knee immobilizer and pulling on foley. Have asked him several times to leave medical equipment alone. He says he wants "it all off" so he can go home. Demanding staff give him a phone so he can call [employer] to have them return his clothes so he can "go to work." Have explained his situation repeatedly and he is in denial of his status. I let him know that he if he refuses to cooperate we will have to restrict his movements to keep him safe and prevent him from further injuring himself. At this time he agrees to leave medical equipment alone. [name]
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
## 2012-12-01 NOTE — Progress Notes (Signed)
Patient transferred from [address] to [address], after report given to receiving RN, Claris Che.  Patient's medications, chart, and personal belongings all sent with patient.  Family called regarding transfer.  Keitha Butte, RN

## 2012-12-01 NOTE — Progress Notes (Signed)
PT Cancellation Note  Patient Details Name: [name] MRN: [id number] DOB: 09/10/1965   Cancelled Treatment:    Reason Eval/Treat Not Completed: Pain limiting ability to participate;Patient not medically ready. Pt highly agitated and MD has ordered Ativan. Will hold off until tomorrow to evaluate pt when more appropriate.   Donnamarie Poag [place], [place] [phone] 12/01/2012, 3:49 PM

## 2012-12-01 NOTE — Progress Notes (Signed)
Pt has ripped off condom cath, knee immobilizer and remaining cast on right hand/thumb area.  MD paged, new orders; Ortho to apply splint until the AM when Dr Amanda Pea will then apply thumb spika cast at bedside.  Materials to be provided at bedside by ortho.  Sitter at bedside was also ordered.  Nsg to continue to monitor for status changes.

## 2012-12-01 NOTE — Progress Notes (Signed)
Orthopedic Tech Progress Note Patient Details:  [name] [facility] 02-25-1966 [id number]  Ortho Devices Type of Ortho Device: Ace wrap;Thumb spica splint Splint Material: Plaster Ortho Device/Splint Location: left hand Ortho Device/Splint Interventions: Application   [name] 12/01/2012, 6:16 PM
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
## 2012-12-01 NOTE — Progress Notes (Addendum)
Called to room and patient anxious, moving around in the bed, pulled off cast and knee immobilizer, picked at dressing. Ortho tech called and checked Left arm - wrapped with gauze, but need to call Ortho.MD. Mepilex and knee immobilizer replaced,  Dr Lindie Spruce called and received order to re-instate CIWA and prn Ativan. Patient moved to camera room for safety. Mother present at [facility]. Dr Carola Frost paged. See CIWA documentation.

## 2012-12-02 ENCOUNTER — Inpatient Hospital Stay (HOSPITAL_COMMUNITY): Payer: Medicaid Other

## 2012-12-02 DIAGNOSIS — D62 Acute posthemorrhagic anemia: Secondary | ICD-10-CM

## 2012-12-02 MED ORDER — OXYCODONE HCL 5 MG PO TABS
5.0000 mg | ORAL_TABLET | ORAL | Status: DC | PRN
  Administered 2012-12-02: 15 mg via ORAL
  Administered 2012-12-03: 10 mg via ORAL
  Administered 2012-12-03 (×3): 15 mg via ORAL
  Administered 2012-12-03 – 2012-12-04 (×4): 10 mg via ORAL
  Filled 2012-12-02: qty 3
  Filled 2012-12-02 (×2): qty 2
  Filled 2012-12-02 (×3): qty 3
  Filled 2012-12-02 (×3): qty 2

## 2012-12-02 MED ORDER — ENOXAPARIN SODIUM 40 MG/0.4ML ~~LOC~~ SOLN
40.0000 mg | SUBCUTANEOUS | Status: DC
  Administered 2012-12-02 – 2012-12-03 (×2): 40 mg via SUBCUTANEOUS
  Filled 2012-12-02 (×3): qty 0.4

## 2012-12-02 MED ORDER — ENSURE COMPLETE PO LIQD
237.0000 mL | Freq: Two times a day (BID) | ORAL | Status: DC
  Administered 2012-12-02 – 2012-12-05 (×3): 237 mL via ORAL

## 2012-12-02 MED ORDER — HYDROMORPHONE HCL PF 1 MG/ML IJ SOLN
0.5000 mg | INTRAMUSCULAR | Status: DC | PRN
  Administered 2012-12-02 – 2012-12-04 (×8): 0.5 mg via INTRAVENOUS
  Filled 2012-12-02 (×8): qty 1

## 2012-12-02 MED ORDER — HYDROMORPHONE HCL PF 1 MG/ML IJ SOLN
INTRAMUSCULAR | Status: AC
  Filled 2012-12-02: qty 1

## 2012-12-02 MED ORDER — SPIRITUS FRUMENTI
1.0000 | Freq: Three times a day (TID) | ORAL | Status: DC
  Administered 2012-12-02 – 2012-12-03 (×5): 1 via ORAL
  Filled 2012-12-02 (×13): qty 1

## 2012-12-02 MED ORDER — BACITRACIN ZINC 500 UNIT/GM EX OINT
TOPICAL_OINTMENT | Freq: Two times a day (BID) | CUTANEOUS | Status: DC
  Administered 2012-12-02 – 2012-12-03 (×3): via TOPICAL
  Administered 2012-12-03: 1 via TOPICAL
  Administered 2012-12-04 – 2012-12-05 (×2): via TOPICAL
  Filled 2012-12-02: qty 15

## 2012-12-02 NOTE — Progress Notes (Signed)
Orthopedic Tech Progress Note Patient Details:  [name] [facility] Sep 25, 1965 [id number]  Ortho Devices Type of Ortho Device: Roland Rack boot Splint Material: Plaster Ortho Device/Splint Location: LLE Ortho Device/Splint Interventions: Ordered;Application   Jennye Moccasin 12/02/2012, 5:31 PM

## 2012-12-02 NOTE — Progress Notes (Signed)
Patient ID: [name], male   DOB: [birth year], 47 y.o.   MRN: [id number] Patient is alert and appropriate. He is degree of agitation is decreased markedly. He complains of pain. His most painful areas his right leg as appropriate to his injury Vital signs are stable He is tolerating his by mouth intake and diet He has his left thumb examined. He is sensate in the tip of his thumb his pin is intact I dressed the wound and provided a short arm thumb spica cast. I discussed with the patient I would prefer he not manipulate take off his bandages. He has already removed 1 bandage and we had to reapply. I do feel that compliance is a huge issue with this gentleman and will continue to be are greatest obstacle. He has many challenges facing him.  At present time I'm pleased to say the thumb is intact and has refill he is sensate there is no immediate complications however this was an irritation and severe injury to the joint. I explained him I would expect some degree of stiffness long term.  We'll continue the cast of the next 10 days then remove it for suture removal. We will keep the pin in 4-6 weeks given the severity of the injury  W Analeigha Nauman M.D.
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
## 2012-12-02 NOTE — Progress Notes (Signed)
I spoke to Dr. Kelly Splinter this AM and she plans ORIF facial FXs this Wednesday.  Working on likely withdrawal issues.  Check flex ex. Patient examined and I agree with the assessment and plan  Violeta Gelinas, MD, MPH, FACS Pager: [phone]  12/02/2012 11:09 AM
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
## 2012-12-02 NOTE — Progress Notes (Signed)
Rehab Admissions Coordinator Note:  Patient was screened by Brock Ra for appropriateness for an Inpatient Acute Rehab Consult.  At this time, we are recommending Inpatient Rehab consult.  Melanee Spry S 12/02/2012, 11:47 AM  I can be reached at [phone].

## 2012-12-02 NOTE — Progress Notes (Signed)
Orthopedic Tech Progress Note Patient Details:  Reynald Woods [facility] 05-Oct-1965 [id number] Patient ID: Jinny Blossom, male   DOB: 26-Feb-1966, 47 y.o.   MRN: [id number]  Roland Rack boot was not applied to this patient. Jennye Moccasin 12/02/2012, 6:23 PM
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
## 2012-12-03 ENCOUNTER — Encounter (HOSPITAL_COMMUNITY): Payer: Self-pay | Admitting: Anesthesiology

## 2012-12-03 DIAGNOSIS — I517 Cardiomegaly: Secondary | ICD-10-CM

## 2012-12-03 LAB — CBC
Hemoglobin: 8.2 g/dL — ABNORMAL LOW (ref 13.0–17.0)
MCH: 29.4 pg (ref 26.0–34.0)
RBC: 2.79 MIL/uL — ABNORMAL LOW (ref 4.22–5.81)
WBC: 7.1 10*3/uL (ref 4.0–10.5)

## 2012-12-03 NOTE — Progress Notes (Signed)
Orthopedic Tech Progress Note Patient Details:  [name] [facility] 24-Mar-1966 [id number] Order by Dr. Martha Clan Devices Type of Ortho Device: Thumb velcro splint Splint Material: Plaster Ortho Device/Splint Location: RUE Ortho Device/Splint Interventions: Ordered;Application   [name] 12/03/2012, 7:31 PM

## 2012-12-03 NOTE — Progress Notes (Signed)
MS is improving. Oriented with cues. Check echo.  Or tomorrow with Dr. Kelly Splinter.  Ortho plans regarding R thumb and knee noted. Patient examined and I agree with the assessment and plan  Violeta Gelinas, MD, MPH, FACS Pager: [phone]  12/03/2012 10:34 AM

## 2012-12-03 NOTE — Clinical Social Work Psychosocial (Addendum)
    Clinical Social Work Department BRIEF PSYCHOSOCIAL ASSESSMENT 12/03/2012  Patient:  [name], [name]     Account Number:  [id number]     Admit date:  11/28/2012  Clinical Social Worker:  Hulan Fray  Date/Time:  12/03/2012 03:02 PM  Referred by:  CSW  Date Referred:  12/03/2012 Referred for  Psychosocial assessment   Other Referral:   Interview type:  Patient Other interview type:   mother- Dorene Grebe    PSYCHOSOCIAL DATA Living Status:  FAMILY Admitted from facility:   Level of care:   Primary support name:  Dorene Grebe Primary support relationship to patient:  PARENT Degree of support available:   supportive    CURRENT CONCERNS Current Concerns  Post-Acute Placement   Other Concerns:    SOCIAL WORK ASSESSMENT / PLAN Clinical Social Worker received referral for SNF placement for patient vs CIR. CSW introduced self and explained reason for visit. Patient appeared drowsy during assessment. Patient reported, "I'll do whatever". Patient reported that he has support around him, his mother, sister and uncle. CSW explained SNF process and provided SNF packet. CSW informed patient that it is best if CSW extends SNF search in multiple counties. Patient reported that he is agreeable for CSW to initiate SNF search in [place], [place], [place] and [place] counties. Patient reported that he "lives with family." CSW inquired if it was okay for CSW to speak with mother, and patient was agreeable. Patient also self reported that he smokes "weed and nothing else." CSW inquired about last time smoking and patient reported "a week ago." Patient reported, that he was "locked up 9 months ago, so I don't have a problem." CSW inquired about resources to quite and patient declined resources and reported that he did not want to try to quite.    CSW called and spoke with mother and she reported that she and her daughter contacted [facility] and prefers patient to be placed there.  Mother reported that she is in the "medical field and has 36 years of Home Care in nursing." Mother reported that she was previously at [facility] and was very satisfied with their services and facility. Mother reported that patient will be able to discharge to her home after rehab stay.    CSW will complete FL2 for MD's signature and update patient and family when bed offers are made.   Assessment/plan status:  Psychosocial Support/Ongoing Assessment of Needs Other assessment/ plan:   Patient reported he was interested in CSW contacting Financial Counselor regarding Medicaid application. CSW left FC a voice message regarding request.  Information/referral to community resources:   SNF packet    PATIENT'S/FAMILY'S RESPONSE TO PLAN OF CARE: Patient and mother are agreeable for CSW to intiate SNF search in [place], [place], [place] and [place] counties. Mother reported that she is preferring [facility]. Patient appeared to not have a preference for disposition.    (Coverage for [facility])  [phone]
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
## 2012-12-03 NOTE — Progress Notes (Signed)
  Echocardiogram 2D Echocardiogram has been performed.  [name] 12/03/2012, 6:44 PM 
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
## 2012-12-03 NOTE — Progress Notes (Signed)
Orthopedic Tech Progress Note Patient Details:  [name] [facility] July 04, 1965 [id number] Thumb spica cast applied to RUE by [name], Ortho Tech. Documented by [name], Ortho Tech. Ortho Devices Type of Ortho Device: Radio broadcast assistant Splint Material: Plaster Ortho Device/Splint Location: LLE Ortho Device/Splint Interventions: Ordered;Application   [name] 12/03/2012, 11:47 AM
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
## 2012-12-03 NOTE — Progress Notes (Signed)
Patient ID: [name], male   DOB: 1965/06/16, 47 y.o.   MRN: [id number] Patient seen and examined at bedside. His mother is here with him. She is well aware of his challenges and the injuries.  He is alert but does not know where he is, the month, the year, the Pres. He is aware of his mother in the room and nose her.  He is conversant and pleasant.  Left upper extremity is stable we're going continue casting. We have removed the  sutures July 19 approximately. I have discussed with his mother to call office at [phone] to see Mr. Wynona Neat my physician assistant who will remove his sutures and reapply his cast. If he is still in-house we asked to be notified otherwise we certainly want to see him back in July 19 approximately. I will be away on vacation during that week but Mr. Wynona Neat will be available and we'll carry out our postop plan  The patient's right arm is examined there is concern over laxity in his thumb. He has his cast removed. I've examined him at length. 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 I would not recommend doing anything acutely for this. I discussed with the orthopedic Tech his mother and the patient that we'll place him in a  brace and he can remove this as much as he wants to. This appears to be an old injury. One could certainly consider a palmaris or FCR tendon graft as a salvage/leg reconstruction. This is not something I would recommend acutely for this gentleman. I feel he has been living with this injury for quite some time and would simply let all dust settle in terms of his sensorium and acute injuries before I would consider an elective salvage-type procedure. Oftentimes people do fairly well with his old chronic injuries and if they go onto advanced arthrosis a fusion can be performed. Otherwise a ligamentous reconstruction with tendon graft is an option if the cartilage is still stable and the patient is a good candidate. Certainly this  patient is not a good candidate for elective measures at this time. I explained this to he and his mother. They both agreed.  Thus, the right upper extremity has a chronic injury which I do not feel requires any acute measures for present time. I would continue the postop care for his left thumb as outlined above.  All questions were encouraged and answered. I wrote my name and number down for the patient's mother so that she can range proper followup once he is discharged.  I do perceive this to be a challenging situation into the postop period.  Dominica Severin M.D.
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
## 2012-12-04 ENCOUNTER — Other Ambulatory Visit: Payer: Self-pay | Admitting: Plastic Surgery

## 2012-12-04 ENCOUNTER — Encounter (HOSPITAL_COMMUNITY): Admission: EM | Disposition: A | Discharge status: Rehabilitation Facility | Payer: Self-pay | Source: Home / Self Care

## 2012-12-04 ENCOUNTER — Encounter (HOSPITAL_COMMUNITY): Payer: Self-pay | Admitting: Anesthesiology

## 2012-12-04 ENCOUNTER — Inpatient Hospital Stay (HOSPITAL_COMMUNITY): Payer: Medicaid Other | Admitting: Anesthesiology

## 2012-12-04 ENCOUNTER — Encounter (HOSPITAL_COMMUNITY): Payer: Self-pay | Admitting: Plastic Surgery

## 2012-12-04 DIAGNOSIS — S02402D Zygomatic fracture, unspecified, subsequent encounter for fracture with routine healing: Secondary | ICD-10-CM

## 2012-12-04 DIAGNOSIS — R011 Cardiac murmur, unspecified: Secondary | ICD-10-CM

## 2012-12-04 HISTORY — PX: ORIF MANDIBULAR FRACTURE: SHX2127

## 2012-12-04 SURGERY — OPEN REDUCTION INTERNAL FIXATION (ORIF) MANDIBULAR FRACTURE
Anesthesia: General | Site: Face | Wound class: Clean

## 2012-12-04 MED ORDER — OXYCODONE HCL 5 MG/5ML PO SOLN: 5.0000 mg | Freq: Once | ORAL | Status: DC | PRN

## 2012-12-04 MED ORDER — BACITRACIN ZINC 500 UNIT/GM EX OINT
TOPICAL_OINTMENT | CUTANEOUS | Status: AC
  Filled 2012-12-04: qty 15

## 2012-12-04 MED ORDER — OXYMETAZOLINE HCL 0.05 % NA SOLN
NASAL | Status: AC
  Filled 2012-12-04: qty 15

## 2012-12-04 MED ORDER — TRAMADOL HCL 50 MG PO TABS
100.0000 mg | ORAL_TABLET | Freq: Four times a day (QID) | ORAL | Status: DC
  Administered 2012-12-04 – 2012-12-05 (×4): 100 mg via ORAL
  Filled 2012-12-04 (×4): qty 2

## 2012-12-04 MED ORDER — LACTATED RINGERS IV SOLN
INTRAVENOUS | Status: DC | PRN
  Administered 2012-12-04 (×2): via INTRAVENOUS

## 2012-12-04 MED ORDER — HYDROMORPHONE HCL PF 1 MG/ML IJ SOLN
0.2500 mg | INTRAMUSCULAR | Status: DC | PRN
  Administered 2012-12-04: 0.5 mg via INTRAVENOUS

## 2012-12-04 MED ORDER — 0.9 % SODIUM CHLORIDE (POUR BTL) OPTIME
TOPICAL | Status: DC | PRN
  Administered 2012-12-04: 1000 mL

## 2012-12-04 MED ORDER — BACITRACIN-POLYMYXIN B 500-10000 UNIT/GM OP OINT
TOPICAL_OINTMENT | OPHTHALMIC | Status: AC
  Filled 2012-12-04: qty 3.5

## 2012-12-04 MED ORDER — BACITRACIN ZINC 500 UNIT/GM EX OINT
TOPICAL_OINTMENT | CUTANEOUS | Status: DC | PRN
  Administered 2012-12-04: 1 via TOPICAL

## 2012-12-04 MED ORDER — LIDOCAINE-EPINEPHRINE 1 %-1:100000 IJ SOLN
INTRAMUSCULAR | Status: AC
  Filled 2012-12-04: qty 1

## 2012-12-04 MED ORDER — BSS IO SOLN
INTRAOCULAR | Status: DC | PRN
  Administered 2012-12-04: 15 mL via INTRAOCULAR

## 2012-12-04 MED ORDER — PROMETHAZINE HCL 25 MG/ML IJ SOLN: 6.2500 mg | INTRAMUSCULAR | Status: DC | PRN

## 2012-12-04 MED ORDER — SODIUM CHLORIDE 0.9 % IJ SOLN
10.0000 mL | INTRAMUSCULAR | Status: DC | PRN
  Administered 2012-12-04: 10 mL

## 2012-12-04 MED ORDER — ROCURONIUM BROMIDE 100 MG/10ML IV SOLN
INTRAVENOUS | Status: DC | PRN
  Administered 2012-12-04: 30 mg via INTRAVENOUS

## 2012-12-04 MED ORDER — SODIUM CHLORIDE 0.9 % IV SOLN
10.0000 mg | INTRAVENOUS | Status: DC | PRN
  Administered 2012-12-04: 20 ug/min via INTRAVENOUS

## 2012-12-04 MED ORDER — LACTATED RINGERS IV SOLN
INTRAVENOUS | Status: DC
  Administered 2012-12-04 (×2): via INTRAVENOUS

## 2012-12-04 MED ORDER — LIDOCAINE-EPINEPHRINE 1 %-1:100000 IJ SOLN
INTRAMUSCULAR | Status: DC | PRN
  Administered 2012-12-04: 8 mL

## 2012-12-04 MED ORDER — CEFAZOLIN SODIUM-DEXTROSE 2-3 GM-% IV SOLR
2.0000 g | Freq: Once | INTRAVENOUS | Status: DC
  Filled 2012-12-04: qty 50

## 2012-12-04 MED ORDER — ONDANSETRON HCL 4 MG/2ML IJ SOLN
INTRAMUSCULAR | Status: DC | PRN
  Administered 2012-12-04 (×2): 4 mg via INTRAVENOUS

## 2012-12-04 MED ORDER — GLYCOPYRROLATE 0.2 MG/ML IJ SOLN
INTRAMUSCULAR | Status: DC | PRN
  Administered 2012-12-04: 0.6 mg via INTRAVENOUS

## 2012-12-04 MED ORDER — CEFAZOLIN SODIUM-DEXTROSE 2-3 GM-% IV SOLR
INTRAVENOUS | Status: AC
  Administered 2012-12-04: 2 g via INTRAVENOUS
  Filled 2012-12-04: qty 50

## 2012-12-04 MED ORDER — LIDOCAINE HCL (CARDIAC) 20 MG/ML IV SOLN
INTRAVENOUS | Status: DC | PRN
  Administered 2012-12-04: 80 mg via INTRAVENOUS

## 2012-12-04 MED ORDER — HYDROMORPHONE HCL PF 1 MG/ML IJ SOLN
INTRAMUSCULAR | Status: AC
  Filled 2012-12-04: qty 1

## 2012-12-04 MED ORDER — PHENYLEPHRINE HCL 10 MG/ML IJ SOLN
INTRAMUSCULAR | Status: DC | PRN
  Administered 2012-12-04 (×3): 80 ug via INTRAVENOUS

## 2012-12-04 MED ORDER — MIDAZOLAM HCL 5 MG/5ML IJ SOLN
INTRAMUSCULAR | Status: DC | PRN
  Administered 2012-12-04: 2 mg via INTRAVENOUS

## 2012-12-04 MED ORDER — OXYCODONE HCL 5 MG PO TABS: 5.0000 mg | ORAL_TABLET | Freq: Once | ORAL | Status: DC | PRN

## 2012-12-04 MED ORDER — HYDROMORPHONE HCL PF 1 MG/ML IJ SOLN: 0.2500 mg | INTRAMUSCULAR | Status: DC | PRN

## 2012-12-04 MED ORDER — ARTIFICIAL TEARS OP OINT
TOPICAL_OINTMENT | OPHTHALMIC | Status: DC | PRN
  Administered 2012-12-04: 1 via OPHTHALMIC

## 2012-12-04 MED ORDER — PROPOFOL 10 MG/ML IV BOLUS
INTRAVENOUS | Status: DC | PRN
  Administered 2012-12-04: 40 mg via INTRAVENOUS
  Administered 2012-12-04: 160 mg via INTRAVENOUS

## 2012-12-04 MED ORDER — SODIUM CHLORIDE 0.9 % IJ SOLN: 10.0000 mL | Freq: Two times a day (BID) | INTRAMUSCULAR | Status: DC

## 2012-12-04 MED ORDER — OXYCODONE HCL 5 MG PO TABS
10.0000 mg | ORAL_TABLET | ORAL | Status: DC | PRN
  Administered 2012-12-04: 15 mg via ORAL
  Administered 2012-12-05: 20 mg via ORAL
  Administered 2012-12-05: 10 mg via ORAL
  Administered 2012-12-05: 20 mg via ORAL
  Filled 2012-12-04: qty 3
  Filled 2012-12-04 (×2): qty 4
  Filled 2012-12-04: qty 2

## 2012-12-04 MED ORDER — FENTANYL CITRATE 0.05 MG/ML IJ SOLN
INTRAMUSCULAR | Status: DC | PRN
  Administered 2012-12-04: 100 ug via INTRAVENOUS
  Administered 2012-12-04: 50 ug via INTRAVENOUS
  Administered 2012-12-04: 100 ug via INTRAVENOUS

## 2012-12-04 MED ORDER — SUCCINYLCHOLINE CHLORIDE 20 MG/ML IJ SOLN
INTRAMUSCULAR | Status: DC | PRN
  Administered 2012-12-04: 100 mg via INTRAVENOUS

## 2012-12-04 MED ORDER — NEOSTIGMINE METHYLSULFATE 1 MG/ML IJ SOLN
INTRAMUSCULAR | Status: DC | PRN
  Administered 2012-12-04: 4 mg via INTRAVENOUS

## 2012-12-04 SURGICAL SUPPLY — 54 items
BIT DRILL 1.6X115 (BIT) ×1
BIT DRILL 1.6X115MM (BIT) ×1 IMPLANT
BLADE SURG 15 STRL LF DISP TIS (BLADE) ×1 IMPLANT
BLADE SURG 15 STRL SS (BLADE) ×1
BLADE SURG ROTATE 9660 (MISCELLANEOUS) IMPLANT
CANISTER SUCTION 2500CC (MISCELLANEOUS) ×2 IMPLANT
CLEANER TIP ELECTROSURG 2X2 (MISCELLANEOUS) ×2 IMPLANT
CLOTH BEACON ORANGE TIMEOUT ST (SAFETY) ×2 IMPLANT
COVER SURGICAL LIGHT HANDLE (MISCELLANEOUS) ×2 IMPLANT
DECANTER SPIKE VIAL GLASS SM (MISCELLANEOUS) ×2 IMPLANT
DRILL BIT 1.6X115MM (BIT) ×1
DRILL BIT, 1.1X50MM, 5MMSTOP, W/NOTCH ×2 IMPLANT
ELECT COATED BLADE 2.86 ST (ELECTRODE) ×2 IMPLANT
ELECT REM PT RETURN 9FT ADLT (ELECTROSURGICAL) ×2
ELECTRODE REM PT RTRN 9FT ADLT (ELECTROSURGICAL) ×1 IMPLANT
GLOVE BIO SURGEON STRL SZ 6.5 (GLOVE) ×4 IMPLANT
GLOVE BIOGEL PI IND STRL 6.5 (GLOVE) ×1 IMPLANT
GLOVE BIOGEL PI IND STRL 7.5 (GLOVE) ×1 IMPLANT
GLOVE BIOGEL PI INDICATOR 6.5 (GLOVE) ×1
GLOVE BIOGEL PI INDICATOR 7.5 (GLOVE) ×1
GLOVE SURG SS PI 6.5 STRL IVOR (GLOVE) ×2 IMPLANT
GLOVE SURG SS PI 7.0 STRL IVOR (GLOVE) ×2 IMPLANT
GOWN STRL NON-REIN LRG LVL3 (GOWN DISPOSABLE) ×6 IMPLANT
KIT BASIN OR (CUSTOM PROCEDURE TRAY) ×2 IMPLANT
KIT ROOM TURNOVER OR (KITS) ×2 IMPLANT
NEEDLE 27GAX1X1/2 (NEEDLE) ×2 IMPLANT
NEEDLE HYPO 30X.5 LL (NEEDLE) ×2 IMPLANT
NS IRRIG 1000ML POUR BTL (IV SOLUTION) ×2 IMPLANT
PAD ARMBOARD 7.5X6 YLW CONV (MISCELLANEOUS) ×4 IMPLANT
PENCIL BUTTON HOLSTER BLD 10FT (ELECTRODE) ×2 IMPLANT
PLATE DOUBLE 6 HOLE (Plate) ×2 IMPLANT
PLATE ORBITAL 4H HX1.5/0.5 PLA (Plate) ×4 IMPLANT
PROTECTOR CORNEAL (OPHTHALMIC RELATED) ×2 IMPLANT
SCISSORS WIRE ANG 4 3/4 DISP (INSTRUMENTS) IMPLANT
SCREW SELF DRILL HT 1.5/5MM (Screw) ×24 IMPLANT
SUT ETHILON 4 0 CL P 3 (SUTURE) IMPLANT
SUT MON AB 3-0 SH 27 (SUTURE) ×1
SUT MON AB 3-0 SH27 (SUTURE) ×1 IMPLANT
SUT MON AB 5-0 PS2 18 (SUTURE) ×2 IMPLANT
SUT PROLENE 6 0 PC 1 (SUTURE) ×2 IMPLANT
SUT SILK 6 0 P 1 (SUTURE) ×2 IMPLANT
SUT STEEL 0 (SUTURE)
SUT STEEL 0 18XMFL TIE 17 (SUTURE) IMPLANT
SUT STEEL 1 (SUTURE) IMPLANT
SUT STEEL 2 (SUTURE) IMPLANT
SUT STEEL 4 (SUTURE) IMPLANT
SUT VIC AB 5-0 PS2 18 (SUTURE) ×2 IMPLANT
SUT VICRYL 4-0 PS2 18IN ABS (SUTURE) ×2 IMPLANT
SUT VICRYL 6 0 P 1 18 (SUTURE) ×2 IMPLANT
TOWEL OR 17X24 6PK STRL BLUE (TOWEL DISPOSABLE) IMPLANT
TOWEL OR 17X26 10 PK STRL BLUE (TOWEL DISPOSABLE) ×2 IMPLANT
TRAY ENT MC OR (CUSTOM PROCEDURE TRAY) ×2 IMPLANT
TRAY FOLEY CATH 14FRSI W/METER (CATHETERS) IMPLANT
WATER STERILE IRR 1000ML POUR (IV SOLUTION) IMPLANT

## 2012-12-04 NOTE — Anesthesia Postprocedure Evaluation (Signed)
  Anesthesia Post-op Note  Patient: [name]  Procedure(s) Performed: Procedure(s): OPEN REDUCTION INTERNAL FIXATION TRIPOID FRACTURE (N/A)  Patient Location: PACU  Anesthesia Type:General  Level of Consciousness: awake, oriented, sedated and patient cooperative  Airway and Oxygen Therapy: Patient Spontanous Breathing  Post-op Pain: mild  Post-op Assessment: Post-op Vital signs reviewed, Patient's Cardiovascular Status Stable, Respiratory Function Stable, Patent Airway, No signs of Nausea or vomiting and Pain level controlled  Post-op Vital Signs: stable  Complications: No apparent anesthesia complications

## 2012-12-04 NOTE — Brief Op Note (Signed)
11/28/2012 - 12/04/2012  3:32 PM  PATIENT:  Rosina Lowenstein Demonte  47 y.o. male  PRE-OPERATIVE DIAGNOSIS:  RIGHT TRIPOID FRACTURE  POST-OPERATIVE DIAGNOSIS:  RIGHT TRIPOID FRACTURE  PROCEDURE:  Procedure(s): OPEN REDUCTION INTERNAL FIXATION TRIPOID FRACTURE (N/A)  SURGEON:  Surgeon(s) and Role:    * Mechelle Pates Sanger, DO - Primary  PHYSICIAN ASSISTANT: none  ASSISTANTS: none   ANESTHESIA:   general  EBL:  Total I/O In: 1600 [I.V.:1600] Out: -   BLOOD ADMINISTERED:none  DRAINS: none   LOCAL MEDICATIONS USED:  LIDOCAINE   SPECIMEN:  No Specimen  DISPOSITION OF SPECIMEN:  N/A  COUNTS:  YES  TOURNIQUET:  * No tourniquets in log *  DICTATION: .Dragon Dictation  PLAN OF CARE: Discharge to home after PACU  PATIENT DISPOSITION:  PACU - hemodynamically stable.   Delay start of Pharmacological VTE agent (>24hrs) due to surgical blood loss or risk of bleeding: no

## 2012-12-04 NOTE — Interval H&P Note (Signed)
History and Physical Interval Note:  12/04/2012 12:36 PM  [name]  has presented today for surgery, with the diagnosis of RIGHT TRIPOID FRACTURE  The various methods of treatment have been discussed with the patient and family. After consideration of risks, benefits and other options for treatment, the patient has consented to  Procedure(s): OPEN REDUCTION INTERNAL FIXATION TRIPOID FRACTURE (N/A) as a surgical intervention .  The patient's history has been reviewed, patient examined, no change in status, stable for surgery.  I have reviewed the patient's chart and labs.  Questions were answered to the patient's satisfaction.     SANGER,CLAIRE
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
## 2012-12-04 NOTE — Transfer of Care (Signed)
Immediate Anesthesia Transfer of Care Note  Patient: [name]  Procedure(s) Performed: Procedure(s): OPEN REDUCTION INTERNAL FIXATION TRIPOID FRACTURE (N/A)  Patient Location: PACU  Anesthesia Type:General  Level of Consciousness: awake, alert , oriented and patient cooperative  Airway & Oxygen Therapy: Patient Spontanous Breathing  Post-op Assessment: Report given to PACU RN, Post -op Vital signs reviewed and stable and Patient moving all extremities X 4  Post vital signs: Reviewed and stable  Complications: No apparent anesthesia complications
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
## 2012-12-04 NOTE — Progress Notes (Signed)
PT Note:  Treatment cancelled today due to pt in surgery. Will follow up tomorrow. Lyanne Co, PT  Acute Rehab Services  [phone]

## 2012-12-04 NOTE — Clinical Social Work Note (Addendum)
Clinical Social Worker received voice message for CSW to call patient's sister Marcelino Duster, [phone]. Per sister, she was interested in having CSW update her on SNF referral process. Per sister, patient might have Medicare. CSW encouraged sister to bring in Medicare card and give to admitting department, so his chart can be updated. Sister reported that she will "get on that". CSW informed sister, that at this moment patient does not have any SNF bed offers received. CSW will continue to follow.   10:56am CSW received call from sister and she reported that patient has Medicaid insurance. Sister reported that she will go to DSS if needed to check on Medicaid. CSW contacted financial counseling and they are looking into the status of his insurance.   11:31am CSW received call from financial counselor and she reported that patient does not have regular medicaid. Financial counselor is still following along.  Rozetta Nunnery MSW, [employer] [phone]

## 2012-12-04 NOTE — Progress Notes (Signed)
Patient is stable.  Going to OR presently.  This patient has been seen and I agree with the findings and treatment plan.  Marta Lamas. Gae Bon, MD, FACS [phone] (pager) [phone] (direct pager) Trauma Surgeon
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
## 2012-12-04 NOTE — Progress Notes (Signed)
Attempting to void

## 2012-12-04 NOTE — Preoperative (Signed)
Beta Blockers   Reason not to administer Beta Blockers:Not Applicable 

## 2012-12-04 NOTE — Anesthesia Preprocedure Evaluation (Addendum)
Anesthesia Evaluation  Patient identified by MRN, date of birth, ID band Patient awake    Reviewed: Allergy & Precautions, H&P , NPO status , Patient's Chart, lab work & pertinent test results  History of Anesthesia Complications Negative for: history of anesthetic complications  Airway Mallampati: II TM Distance: >3 FB Neck ROM: Limited  Mouth opening: Limited Mouth Opening  Dental   Pulmonary Current Smoker,  + rhonchi         Cardiovascular hypertension, Rhythm:Regular Rate:Normal     Neuro/Psych    GI/Hepatic (+)     substance abuse  alcohol use and cocaine use,   Endo/Other    Renal/GU      Musculoskeletal   Abdominal   Peds  Hematology   Anesthesia Other Findings S/p major trauma Pain limits mouth opening  Reproductive/Obstetrics                          Anesthesia Physical Anesthesia Plan  ASA: II  Anesthesia Plan: General   Post-op Pain Management:    Induction: Intravenous  Airway Management Planned: Oral ETT  Additional Equipment:   Intra-op Plan:   Post-operative Plan: Extubation in OR  Informed Consent: I have reviewed the patients History and Physical, chart, labs and discussed the procedure including the risks, benefits and alternatives for the proposed anesthesia with the patient or authorized representative who has indicated his/her understanding and acceptance.     Plan Discussed with: CRNA and Surgeon  Anesthesia Plan Comments:         Anesthesia Quick Evaluation

## 2012-12-05 ENCOUNTER — Encounter (HOSPITAL_COMMUNITY): Payer: Self-pay | Admitting: Emergency Medicine

## 2012-12-05 ENCOUNTER — Encounter (HOSPITAL_COMMUNITY): Payer: Self-pay | Admitting: Orthopedic Surgery

## 2012-12-05 ENCOUNTER — Inpatient Hospital Stay (HOSPITAL_COMMUNITY)
Admission: RE | Admit: 2012-12-05 | Discharge: 2012-12-27 | DRG: 945 | Disposition: A | Discharge status: Skilled Nursing Facility | Payer: No Typology Code available for payment source | Source: Intra-hospital | Attending: Physical Medicine & Rehabilitation | Admitting: Physical Medicine & Rehabilitation

## 2012-12-05 DIAGNOSIS — Y9355 Activity, bike riding: Secondary | ICD-10-CM

## 2012-12-05 DIAGNOSIS — Z79899 Other long term (current) drug therapy: Secondary | ICD-10-CM

## 2012-12-05 DIAGNOSIS — S82009A Unspecified fracture of unspecified patella, initial encounter for closed fracture: Secondary | ICD-10-CM

## 2012-12-05 DIAGNOSIS — S069XAA Unspecified intracranial injury with loss of consciousness status unknown, initial encounter: Secondary | ICD-10-CM

## 2012-12-05 DIAGNOSIS — S0280XA Fracture of other specified skull and facial bones, unspecified side, initial encounter for closed fracture: Secondary | ICD-10-CM

## 2012-12-05 DIAGNOSIS — S069X9A Unspecified intracranial injury with loss of consciousness of unspecified duration, initial encounter: Secondary | ICD-10-CM

## 2012-12-05 DIAGNOSIS — S7410XA Injury of femoral nerve at hip and thigh level, unspecified leg, initial encounter: Secondary | ICD-10-CM

## 2012-12-05 DIAGNOSIS — Y92410 Unspecified street and highway as the place of occurrence of the external cause: Secondary | ICD-10-CM

## 2012-12-05 DIAGNOSIS — Z5189 Encounter for other specified aftercare: Principal | ICD-10-CM

## 2012-12-05 DIAGNOSIS — D62 Acute posthemorrhagic anemia: Secondary | ICD-10-CM

## 2012-12-05 DIAGNOSIS — S02401A Maxillary fracture, unspecified, initial encounter for closed fracture: Secondary | ICD-10-CM

## 2012-12-05 DIAGNOSIS — F1010 Alcohol abuse, uncomplicated: Secondary | ICD-10-CM

## 2012-12-05 DIAGNOSIS — R471 Dysarthria and anarthria: Secondary | ICD-10-CM

## 2012-12-05 DIAGNOSIS — S6290XB Unspecified fracture of unspecified wrist and hand, initial encounter for open fracture: Secondary | ICD-10-CM

## 2012-12-05 DIAGNOSIS — S5420XA Injury of radial nerve at forearm level, unspecified arm, initial encounter: Secondary | ICD-10-CM

## 2012-12-05 DIAGNOSIS — S72309A Unspecified fracture of shaft of unspecified femur, initial encounter for closed fracture: Secondary | ICD-10-CM

## 2012-12-05 DIAGNOSIS — F1410 Cocaine abuse, uncomplicated: Secondary | ICD-10-CM

## 2012-12-05 DIAGNOSIS — S32409A Unspecified fracture of unspecified acetabulum, initial encounter for closed fracture: Secondary | ICD-10-CM

## 2012-12-05 DIAGNOSIS — F17200 Nicotine dependence, unspecified, uncomplicated: Secondary | ICD-10-CM

## 2012-12-05 LAB — CREATININE, SERUM
Creatinine, Ser: 0.85 mg/dL (ref 0.50–1.35)
GFR calc non Af Amer: >90 mL/min (ref >90)

## 2012-12-05 LAB — CBC
MCHC: 33.5 g/dL (ref 30.0–36.0)
RDW: 13.6 % (ref 11.5–15.5)

## 2012-12-05 MED ORDER — SENNOSIDES-DOCUSATE SODIUM 8.6-50 MG PO TABS
1.0000 | ORAL_TABLET | Freq: Every evening | ORAL | Status: DC | PRN
  Administered 2012-12-08: 1 via ORAL
  Filled 2012-12-05 (×2): qty 1

## 2012-12-05 MED ORDER — FOLIC ACID 1 MG PO TABS
1.0000 mg | ORAL_TABLET | Freq: Every day | ORAL | Status: DC
  Administered 2012-12-06 – 2012-12-27 (×22): 1 mg via ORAL
  Filled 2012-12-05 (×23): qty 1

## 2012-12-05 MED ORDER — ENSURE COMPLETE PO LIQD
237.0000 mL | Freq: Two times a day (BID) | ORAL | Status: DC
  Administered 2012-12-06 (×2): 237 mL via ORAL

## 2012-12-05 MED ORDER — CHLORHEXIDINE GLUCONATE 0.12 % MT SOLN
15.0000 mL | Freq: Two times a day (BID) | OROMUCOSAL | Status: DC
  Administered 2012-12-05 – 2012-12-27 (×44): 15 mL via OROMUCOSAL
  Filled 2012-12-05 (×46): qty 15

## 2012-12-05 MED ORDER — POLYETHYLENE GLYCOL 3350 17 G PO PACK
17.0000 g | PACK | Freq: Every day | ORAL | Status: DC
  Administered 2012-12-05: 17 g via ORAL
  Filled 2012-12-05 (×2): qty 1

## 2012-12-05 MED ORDER — POLYETHYLENE GLYCOL 3350 17 G PO PACK
17.0000 g | PACK | Freq: Every day | ORAL | Status: DC
  Administered 2012-12-06 – 2012-12-27 (×21): 17 g via ORAL
  Filled 2012-12-05 (×23): qty 1

## 2012-12-05 MED ORDER — SODIUM CHLORIDE 0.9 % IJ SOLN
10.0000 mL | Freq: Two times a day (BID) | INTRAMUSCULAR | Status: DC
  Administered 2012-12-05: 20 mL

## 2012-12-05 MED ORDER — DOCUSATE SODIUM 100 MG PO CAPS
200.0000 mg | ORAL_CAPSULE | Freq: Two times a day (BID) | ORAL | Status: DC
  Administered 2012-12-05: 200 mg via ORAL

## 2012-12-05 MED ORDER — SODIUM CHLORIDE 0.9 % IJ SOLN
10.0000 mL | INTRAMUSCULAR | Status: DC | PRN
  Administered 2012-12-07: 20 mL
  Administered 2012-12-08 (×2): 10 mL
  Administered 2012-12-09 – 2012-12-11 (×4): 20 mL

## 2012-12-05 MED ORDER — ADULT MULTIVITAMIN W/MINERALS CH
1.0000 | ORAL_TABLET | Freq: Every day | ORAL | Status: DC
Start: 2012-12-06 — End: 2012-12-27
  Administered 2012-12-06 – 2012-12-27 (×22): 1 via ORAL
  Filled 2012-12-05 (×23): qty 1

## 2012-12-05 MED ORDER — SORBITOL 70 % SOLN
30.0000 mL | Freq: Every day | Status: DC | PRN
  Administered 2012-12-05 – 2012-12-08 (×3): 30 mL via ORAL
  Filled 2012-12-05 (×5): qty 30

## 2012-12-05 MED ORDER — ONDANSETRON HCL 4 MG PO TABS: 4.0000 mg | ORAL_TABLET | Freq: Four times a day (QID) | ORAL | Status: DC | PRN

## 2012-12-05 MED ORDER — ACETAMINOPHEN 325 MG PO TABS
325.0000 mg | ORAL_TABLET | ORAL | Status: DC | PRN
  Administered 2012-12-11 – 2012-12-18 (×4): 650 mg via ORAL
  Filled 2012-12-05 (×4): qty 2

## 2012-12-05 MED ORDER — ENOXAPARIN SODIUM 40 MG/0.4ML ~~LOC~~ SOLN
40.0000 mg | SUBCUTANEOUS | Status: DC
  Administered 2012-12-05 – 2012-12-26 (×22): 40 mg via SUBCUTANEOUS
  Filled 2012-12-05 (×23): qty 0.4

## 2012-12-05 MED ORDER — ONDANSETRON HCL 4 MG/2ML IJ SOLN: 4.0000 mg | Freq: Four times a day (QID) | INTRAMUSCULAR | Status: DC | PRN

## 2012-12-05 MED ORDER — OXYCODONE HCL 5 MG PO TABS
10.0000 mg | ORAL_TABLET | ORAL | Status: DC | PRN
  Administered 2012-12-05 – 2012-12-06 (×2): 10 mg via ORAL
  Administered 2012-12-06: 15 mg via ORAL
  Administered 2012-12-06: 5 mg via ORAL
  Administered 2012-12-06: 20 mg via ORAL
  Administered 2012-12-06 (×2): 10 mg via ORAL
  Administered 2012-12-07 (×3): 20 mg via ORAL
  Administered 2012-12-08 (×2): 15 mg via ORAL
  Administered 2012-12-08 (×2): 20 mg via ORAL
  Administered 2012-12-09: 15 mg via ORAL
  Filled 2012-12-05: qty 2
  Filled 2012-12-05 (×2): qty 4
  Filled 2012-12-05 (×3): qty 3
  Filled 2012-12-05: qty 2
  Filled 2012-12-05: qty 1
  Filled 2012-12-05: qty 3
  Filled 2012-12-05: qty 2
  Filled 2012-12-05 (×5): qty 4
  Filled 2012-12-05: qty 2

## 2012-12-05 MED ORDER — TRAMADOL HCL 50 MG PO TABS
100.0000 mg | ORAL_TABLET | Freq: Four times a day (QID) | ORAL | Status: DC
  Administered 2012-12-05 – 2012-12-09 (×14): 100 mg via ORAL
  Filled 2012-12-05 (×5): qty 2
  Filled 2012-12-05: qty 4
  Filled 2012-12-05 (×10): qty 2
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
## 2012-12-05 NOTE — Progress Notes (Signed)
Rehab admissions - Evaluated for possible admission.  I spoke with patient and called his mom.  I left a message with his sister.  Mom is in agreement to inpatient rehab admission.  Mom does not have 24 hr supervision after rehab in place yet.  She mentioned possible need for [facility] after inpatient rehab.  Bed available and will plan to admit to acute inpatient rehab today.  Call me for questions.  #[phone]

## 2012-12-05 NOTE — Progress Notes (Signed)
Pt transferred to 4000 rehab unit. Report given to RN on 4000.

## 2012-12-05 NOTE — Discharge Summary (Signed)
Michaelann Gunnoe, MD, MPH, FACS Pager: [phone]  
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
## 2012-12-05 NOTE — Progress Notes (Signed)
Orthopaedic Trauma Service (OTS)  Subjective: POD 6 s/p R hip IMN, R acetab, R patella repairs  Patient reports pain as moderate.   Still some disorientation.  Objective: Current Vitals Blood pressure 119/74, pulse 75, temperature 99 F (37.2 C), temperature source Oral, resp. rate 16, height 5\' 4"  (1.626 m), weight 70 kg (154 lb 5.2 oz), SpO2 99.00%. Vital signs in last 24 hours: Temp:  [97.5 F (36.4 C)-99 F (37.2 C)] 99 F (37.2 C) (07/10 0657) Pulse Rate:  [75-111] 75 (07/10 0657) Resp:  [11-19] 16 (07/10 0657) BP: (118-142)/(74-89) 119/74 mmHg (07/10 0657) SpO2:  [99 %-100 %] 99 % (07/10 0657)  Intake/Output from previous day: 07/09 0701 - 07/10 0700 In: 2680 [P.O.:480; I.V.:2200] Out: 820 [Urine:800; Blood:20]  LABS  Recent Labs  12/03/12 0500  HGB 8.2*    Recent Labs  12/03/12 0500  WBC 7.1  RBC 2.79*  HCT 23.8*  PLT 223   No results found for this basename: NA, K, CL, CO2, BUN, CREATININE, GLUCOSE, CALCIUM,  in the last 72 hours No results found for this basename: LABPT, INR,  in the last 72 hours  Physical Exam  R leg in hinged Bledsoe, +effusion RLE Sens DPN, SPN, TN intact  Motor EHL, ext, flex, evers 5/5  DP 2+, PT 2+ All dressings C/D/i  Imaging No results found.  Assessment/Plan: TDWB RLE Lovenox PT SW for placement  Myrene Galas, MD [facility] [phone] (p)  12/05/2012, 8:18 AM

## 2012-12-05 NOTE — Progress Notes (Signed)
Physical Therapy Treatment Patient Details Name: [name] MRN: [id number] DOB: 1965/09/04 Today's Date: 12/05/2012 Time: 4098-1191 PT Time Calculation (min): 26 min  PT Assessment / Plan / Recommendation  PT Comments   Patient seen today and making good progress with goals and mobility. Patient still showing inconsistencies with conversation and confusion. Patient had facial surgery yesterday. No PT goals affected. Continue with current POC and Continue to recommend comprehensive inpatient rehab (CIR) for post-acute therapy needs.   Follow Up Recommendations  CIR     Does the patient have the potential to tolerate intense rehabilitation     Barriers to Discharge        Equipment Recommendations  Other (comment)    Recommendations for Other Services Rehab consult  Frequency Min 5X/week   Progress towards PT Goals Progress towards PT goals: Progressing toward goals  Plan Current plan remains appropriate    Precautions / Restrictions Precautions Precautions: Fall Required Braces or Orthoses: Other Brace/Splint Other Brace/Splint: thumb splica on L UE  Restrictions Weight Bearing Restrictions: Yes RLE Weight Bearing: Touchdown weight bearing   Pertinent Vitals/Pain no apparent distress     Mobility  Bed Mobility Supine to Sit: 1: +2 Total assist Supine to Sit: Patient Percentage: 70% Sitting - Scoot to Edge of Bed: 4: Min assist Details for Bed Mobility Assistance: pt requires constant multimodal cues for hand placement and sequencing; Patient able to move LEs well. Needing A to hold R LE while getting off of bed. Min A for shoudlers into upright sitting Transfers Transfers: Sit to Stand Sit to Stand: 1: +2 Total assist;From bed Sit to Stand: Patient Percentage: 70% Stand to Sit: 4: Min assist Details for Transfer Assistance: pt required min facilitation to maintain TDWB status; pt able to WB minimally through L LE with max cues; Cues for safe technique  and to sit slowly Ambulation/Gait Ambulation/Gait Assistance: 1: +2 Total assist Ambulation/Gait: Patient Percentage: 70% Ambulation Distance (Feet): 3 Feet Assistive device: Left platform walker Ambulation/Gait Assistance Details: Patient able to take 3 hops with A for TDWB and cues for technique with PFRW Gait Pattern: Step-to pattern Gait velocity: decreased    Exercises     PT Diagnosis:    PT Problem List:   PT Treatment Interventions:     PT Goals (current goals can now be found in the care plan section)    Visit Information  Last PT Received On: 12/05/12 Assistance Needed: +2 (for safety and ambulation)    Subjective Data      Cognition  Cognition Arousal/Alertness: Awake/alert Behavior During Therapy: WFL for tasks assessed/performed Overall Cognitive Status: Impaired/Different from baseline Area of Impairment: Memory;Following commands;Safety/judgement;Awareness Orientation Level: Disoriented to;Situation Current Attention Level: Sustained Memory: Decreased recall of precautions Following Commands: Follows one step commands inconsistently Problem Solving: Slow processing;Requires tactile cues;Requires verbal cues;Difficulty sequencing;Decreased initiation General Comments: pt with difficulty staying on topic; pt requires max cues to stay on task and is incosistent with one step commands    Balance  Static Sitting Balance Static Sitting - Balance Support: Right upper extremity supported;Feet supported Static Sitting - Level of Assistance: 7: Independent  End of Session PT - End of Session Equipment Utilized During Treatment: Gait belt Activity Tolerance: Patient tolerated treatment well Patient left: in chair;with call bell/phone within reach;with family/visitor present Nurse Communication: Mobility status;Other (comment)   GP     Fredrich Birks 12/05/2012, 11:51 AM 12/05/2012 Fredrich Birks PTA [phone] pager [phone]  office 12/05/2012 Fredrich Birks PTA [phone]  pager [phone] office
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
## 2012-12-05 NOTE — Progress Notes (Signed)
Pt. Admitted to 4W13 at 1700.  AOX3, VSS; no complaints of pain.  Pt.Oriented to rehab.  Call bell in reach.

## 2012-12-05 NOTE — Progress Notes (Addendum)
Covering Clinical Social Worker (CSW) informed that pt will be admitted into CIR today.   CSW received a call from pt sister and notified Nichelle that pt did not qualify for a Medicaid application to be completed in the hospital however encouraged her to still apply at DSS, per financial counseling.  No additional CSW needs, CSW signing off.  Theresia Bough, MSW, Theresia Majors [phone]
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
## 2012-12-05 NOTE — Progress Notes (Signed)
At 0515 pt was found to have removed the subclavian dsg and was attempting to pull the catheter out of his L chest. Reoriented pt to not pull at tubing, cleansed site with several CHG wipes, and placed transparent dsg over site. Contacted IV team to redress site appropriately with antimicrobial disc in place. This same incident occurred the previous around the same time.

## 2012-12-05 NOTE — Consult Note (Signed)
I have seen and examined the patient. I agree with the findings above.  I discussed with the patient the risks and benefits of surgery, including the possibility of infection, nerve injury, vessel injury, wound breakdown, arthritis, symptomatic hardware, DVT/ PE, loss of motion, and need for further surgery among others.  He understood these risks and wished to proceed.  Budd Palmer, MD

## 2012-12-05 NOTE — Progress Notes (Signed)
Patient examined and I agree with the assessment and plan Evaluation for possible CIR.  Working with therapies now and is doing more than before. Violeta Gelinas, MD, MPH, FACS Pager: [phone]  12/05/2012 10:46 AM
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
## 2012-12-06 ENCOUNTER — Encounter (HOSPITAL_COMMUNITY): Payer: Self-pay | Admitting: Plastic Surgery

## 2012-12-06 ENCOUNTER — Inpatient Hospital Stay (HOSPITAL_COMMUNITY): Payer: No Typology Code available for payment source

## 2012-12-06 ENCOUNTER — Inpatient Hospital Stay (HOSPITAL_COMMUNITY): Payer: Self-pay | Admitting: Occupational Therapy

## 2012-12-06 ENCOUNTER — Inpatient Hospital Stay (HOSPITAL_COMMUNITY): Payer: No Typology Code available for payment source | Admitting: Physical Therapy

## 2012-12-06 ENCOUNTER — Inpatient Hospital Stay (HOSPITAL_COMMUNITY): Payer: No Typology Code available for payment source | Admitting: Speech Pathology

## 2012-12-06 DIAGNOSIS — M7989 Other specified soft tissue disorders: Secondary | ICD-10-CM

## 2012-12-06 DIAGNOSIS — S069X9A Unspecified intracranial injury with loss of consciousness of unspecified duration, initial encounter: Secondary | ICD-10-CM

## 2012-12-06 DIAGNOSIS — S069XAA Unspecified intracranial injury with loss of consciousness status unknown, initial encounter: Secondary | ICD-10-CM

## 2012-12-06 LAB — COMPREHENSIVE METABOLIC PANEL
ALT: 32 U/L (ref 0–53)
Alkaline Phosphatase: 43 U/L (ref 39–117)
CO2: 32 mEq/L (ref 19–32)
Calcium: 8.6 mg/dL (ref 8.4–10.5)
GFR calc Af Amer: >90 mL/min (ref >90)
GFR calc non Af Amer: >90 mL/min (ref >90)
Glucose, Bld: 91 mg/dL (ref 70–99)
Potassium: 4 mEq/L (ref 3.5–5.1)
Sodium: 132 mEq/L — ABNORMAL LOW (ref 135–145)
Total Bilirubin: 0.9 mg/dL (ref 0.3–1.2)

## 2012-12-06 LAB — CBC WITH DIFFERENTIAL/PLATELET
Basophils Relative: 1 % (ref 0–1)
Eosinophils Relative: 3 % (ref 0–5)
Hemoglobin: 7.8 g/dL — ABNORMAL LOW (ref 13.0–17.0)
MCH: 29.1 pg (ref 26.0–34.0)
Monocytes Absolute: 1.4 10*3/uL — ABNORMAL HIGH (ref 0.1–1.0)
Neutrophils Relative %: 58 % (ref 43–77)
RBC: 2.68 MIL/uL — ABNORMAL LOW (ref 4.22–5.81)

## 2012-12-06 MED ORDER — ENSURE COMPLETE PO LIQD
237.0000 mL | Freq: Three times a day (TID) | ORAL | Status: DC
  Administered 2012-12-06 – 2012-12-27 (×55): 237 mL via ORAL
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
## 2012-12-06 NOTE — Progress Notes (Signed)
VASCULAR LAB PRELIMINARY  PRELIMINARY  PRELIMINARY  PRELIMINARY  Bilateral lower extremity venous duplex  completed.    Preliminary report:  Bilateral:  No evidence of DVT, superficial thrombosis, or Baker's Cyst.    Destry Dauber, RVT 12/06/2012, 4:10 PM

## 2012-12-06 NOTE — Progress Notes (Signed)
Occupational Therapy Session Note  Patient Details  Name: NAKAI POLLIO MRN: [id number] Date of Birth: 02/26/1966  Today's Date: 12/06/2012  Session 1 Time: 1100-1200 Time Calculation (min): 60 min  Short Term Goals: Week 1:  OT Short Term Goal 1 (Week 1): Pt will attend to a functional tasks with sustained attention for 4 min  OT Short Term Goal 2 (Week 1): Pt will gather his own clothing at w/c level with supervision OT Short Term Goal 3 (Week 1): Pt will be oriented x3 with min questioning cues OT Short Term Goal 4 (Week 1): Pt will perform basic toilet transfer with min A consistently OT Short Term Goal 5 (Week 1): Pt will recall left UE and right LE precautions with max cuing with functional tasks  Skilled Therapeutic Interventions/Progress Updates:  Focus on orientation, safety awareness, task initiation, and attention to task.  Pt was not consistently oriented to place or situation and required constant reorientation throughout session.  Attempted to engage patient in sorting tasks but patient unable to attend to task longer than 10 seconds and would become frustrated when redirected.  Pt insistent on returning to bed and required max redirection throughout session.  Pt requested pain meds throughout session and was reminded that he had just had some pain meds.  Pt returned to bed at end of session requiring max A for stand pivot transfer.  Pt required max verbal cues to maintain weight bearing precautions with LUE. Therapy Documentation Precautions:  Precautions Precautions: Fall Required Braces or Orthoses: Other Brace/Splint Other Brace/Splint: short arm  cast on Lt. UE, Rt. Bledsoe brace Restrictions Weight Bearing Restrictions: Yes LUE Weight Bearing: Non weight bearing RLE Weight Bearing: Touchdown weight bearing Other Position/Activity Restrictions: No ROM to Rt. knee General: General Chart Reviewed: Yes Family/Caregiver Present: No   Pain:  Pt c/o pain in RLE  but unable to rate; requesting pain meds from nurse constantly; RN aware and reminded patient that she had just given him some medications 15 mins prior to session; RLE repositioned.  See FIM for current functional status  Therapy/Group: Individual Therapy  Session 2 Time: 1415-1445 Pt c/o increased pain in RLE but unable to rate; requested pain meds from RN and administered during session; Pt requested additional pain meds 10 mins after RN administered meds. Individual therapy  Pt seated in w/c attempting to use urinal with RN at side.  Pt was unsuccessful and agreed to rolling into day room with Aunt at side.  Initially attempted to engage patient in card game to focus on attending to task.  Pt attempted to stand up stating he needed to use bathroom.  Pt rolled back to room and assisted to [facility] with max A for stand pivot transfer.  Pt attempted to stand up unassisted X 3 while seated on BSC and had to be reminded that he needed assistance before standing up.Pt requested to return to bed but reminded that he needed to stay in w/c.  Pt rolled to nurses station at end of session with quick release belt in place.  [name] [facility] 12/06/2012, 3:04 PM
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
## 2012-12-06 NOTE — Progress Notes (Signed)
Patient information reviewed and entered into [facility] system by Chaos Carlile, RN, CRRN, PPS Coordinator.  Information including medical coding and functional independence measure will be reviewed and updated through discharge.    
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
## 2012-12-07 ENCOUNTER — Encounter (HOSPITAL_COMMUNITY): Payer: Self-pay

## 2012-12-07 ENCOUNTER — Inpatient Hospital Stay (HOSPITAL_COMMUNITY): Payer: No Typology Code available for payment source | Admitting: Speech Pathology

## 2012-12-07 ENCOUNTER — Inpatient Hospital Stay (HOSPITAL_COMMUNITY): Payer: No Typology Code available for payment source

## 2012-12-07 ENCOUNTER — Inpatient Hospital Stay (HOSPITAL_COMMUNITY): Payer: No Typology Code available for payment source | Admitting: *Deleted

## 2012-12-07 DIAGNOSIS — M7989 Other specified soft tissue disorders: Secondary | ICD-10-CM

## 2012-12-07 DIAGNOSIS — I517 Cardiomegaly: Secondary | ICD-10-CM

## 2012-12-07 LAB — CBC WITH DIFFERENTIAL/PLATELET
Basophils Relative: 0 % (ref 0–1)
Eosinophils Absolute: 0.3 10*3/uL (ref 0.0–0.7)
Lymphs Abs: 2.8 10*3/uL (ref 0.7–4.0)
MCH: 29 pg (ref 26.0–34.0)
MCHC: 33.5 g/dL (ref 30.0–36.0)
Monocytes Absolute: 1.1 10*3/uL — ABNORMAL HIGH (ref 0.1–1.0)
Neutrophils Relative %: 61 % (ref 43–77)
Platelets: 556 10*3/uL — ABNORMAL HIGH (ref 150–400)

## 2012-12-07 NOTE — Progress Notes (Signed)
Occupational Therapy Session Note  Patient Details  Name: [name] MRN: [id number] Date of Birth: [birth year]  Today's Date: 12/07/2012 Time: 1100-1200 Time Calculation (min): 60 min  Short Term Goals: Week 1:  OT Short Term Goal 1 (Week 1): Pt will attend to a functional tasks with sustained attention for 4 min  OT Short Term Goal 2 (Week 1): Pt will gather his own clothing at w/c level with supervision OT Short Term Goal 3 (Week 1): Pt will be oriented x3 with min questioning cues OT Short Term Goal 4 (Week 1): Pt will perform basic toilet transfer with min A consistently OT Short Term Goal 5 (Week 1): Pt will recall left UE and right LE precautions with max cuing with functional tasks  Skilled Therapeutic Interventions: ADL-retraining at w/c level, sink side, with emphasis on sustained attention initiation, orientation, and adherence to TDWB @ R-LE precautions during functional transfers.   Patient able to manage bed mobility and transfer to w/c with extra time, questioning cues and only min assist to manage R-LE.  While seated in w/c patient requested assist with toilet transfer but demo'd mild confusion with operation of hand brake for w/c.  Patient completed transfers on/off toilet with steadying assist and demo'd good adherence to TDWB precaution w/o need for cues.   Patient required repeated cues to progress through bathing task due to distraction with his own inspection of his multiple injuries.   Patient responds well to gentle concrete redirection (w/o questions) during bathing but improved with progression of dressing as evidenced by patient asking for each article in sequence.   Throughout treatment patient confused Clinical research associate with a former companion/friend despite verbal cues provided for reorientation.    Therapy Documentation Precautions:  Precautions Precautions: Fall Required Braces or Orthoses: Other Brace/Splint Other Brace/Splint: short arm  cast on Lt. UE, Rt.  Bledsoe brace Restrictions Weight Bearing Restrictions: Yes LUE Weight Bearing: Non weight bearing RLE Weight Bearing: Touchdown weight bearing Other Position/Activity Restrictions: No ROM to Rt. knee  Pain: Pain Assessment Pain Assessment: 0-10 Pain Score: 4  Pain Type: Acute pain Pain Location: Incision Pain Orientation: Lower Pain Descriptors / Indicators: Burning Pain Onset: Unable to tell Patients Stated Pain Goal: 0 Pain Intervention(s): Medication (See eMAR) Multiple Pain Sites: No  See FIM for current functional status  Therapy/Group: Individual Therapy  Georgeanne Nim 12/07/2012, 12:15 PM

## 2012-12-07 NOTE — Progress Notes (Signed)
Speech Language Pathology Daily Session Note  Patient Details  Name: [name] MRN: [id number] Date of Birth: [birth year]  Today's Date: 12/07/2012 Time: 4098-1191 Time Calculation (min): 30 min  Short Term Goals: Week 1: SLP Short Term Goal 1 (Week 1): Pt will orient to time, place and situation with Max A multimodal cues.  SLP Short Term Goal 2 (Week 1): Pt will demonstrate sustained attention to a functional task for 5 minutes with Max A verbal cues for redirection  SLP Short Term Goal 3 (Week 1): Pt will recall safety precautions with Max A multimodal cueing SLP Short Term Goal 4 (Week 1): Pt will maintain topic of conversation for 2 turns with Max A mutlimodal cueing  Skilled Therapeutic Interventions: Treatment focus on cognitive goals. Pt extremely lethargic throughout the session. RN notified and reported he recently received pain medication. Pt required Max multimodal cueing for sustained attention for ~30 second intervals. Pt also required total A to orient to place, time and situation. Pt with limited verbal expression but demonstrated language of confusion with confabulation throughout the session. Pt participated in basic money management task of counting and sorting money and required Mod A verbal cues to complete. Treatment session ended 15 minutes early due to unable to stay alert enough for participation.    FIM:  Comprehension Comprehension Mode: Auditory Comprehension: 2-Understands basic 25 - 49% of the time/requires cueing 51 - 75% of the time Expression Expression Mode: Verbal Expression: 2-Expresses basic 25 - 49% of the time/requires cueing 50 - 75% of the time. Uses single words/gestures. Social Interaction Social Interaction: 2-Interacts appropriately 25 - 49% of time - Needs frequent redirection. Problem Solving Problem Solving: 2-Solves basic 25 - 49% of the time - needs direction more than half the time to initiate, plan or complete simple  activities Memory Memory: 2-Recognizes or recalls 25 - 49% of the time/requires cueing 51 - 75% of the time  Pain Pain Assessment Pain Score: 0-No pain  Therapy/Group: Individual Therapy  Sajjad Honea 12/07/2012, 11:01 AM

## 2012-12-07 NOTE — Progress Notes (Signed)
Physical Therapy Session Note  Patient Details  Name: [name] MRN: [id number] Date of Birth: 1966/05/08  Today's Date: 12/07/2012 Time: 1400-1500 Time Calculation (min): 60 min  Short Term Goals: Week 1:  PT Short Term Goal 1 (Week 1): Pt will perform sit <> stand from bed, wheelchair with min assist.  PT Short Term Goal 2 (Week 1): Pt will verbalize both weight bearing precautions and be able to maintain during mobility > 20% of session with <3 verbal cues.  PT Short Term Goal 3 (Week 1): Pt will ambulate 23' with PFRW and min assist while maintaining preacuations with max verbal cues.   Skilled Therapeutic Interventions/Progress Updates:  Treatment focused on bed mobility, balance, gait, cog rehab including remembering WB precautions, w/c mobility  Awake, oriented to name, location, disoriented to time and reason.  Pt unable to state his wt bearing precautions; he requested removing brace on RLE.    W/c mobilityx 150' with LLE and RUE, wearing splint on R hand, min assist for steering.  Gait with LPFRW x 10' , x  30', focusing on observing precautions, max assist. Therapist's foot under pt's R heel to ensure TDWBing. Pt has difficulty with NWBing LUE during sit>< stand. Platform was adjusted for more space between trunk and LUE.  Therapeutic exercise performed with LE to increase strength for functional mobility. In sitting in w/c: 4# LLE for hip flexion, knee ext, 1 x 10 each; RLE in Bledsoe brace, SLR AA, 1 x 7; R ankle pumps 1 x 10.  Bed> w/c stand pivot transfer mod assist; w/c> bed max assist.   Scooting to [facility] with use of LLE, L elbow, VCs  Pt able to remember therapist's name throughout session.  Bed alarm activated, family in room, call bell within reach.    Therapy Documentation Precautions:  Precautions Precautions: Fall Required Braces or Orthoses: Other Brace/Splint Other Brace/Splint: short arm  cast on Lt. UE, Rt. Bledsoe brace Restrictions Weight  Bearing Restrictions: Yes LUE Weight Bearing: Non weight bearing RLE Weight Bearing: Touchdown weight bearing Other Position/Activity Restrictions: No ROM to Rt. knee  Pain: Pain Assessment Faces Pain Scale: Hurts a little bit Pain Location: Head Pain Intervention(s): Medication (See eMAR)   Locomotion : Ambulation Ambulation/Gait Assistance: 2: Max Lawyer Distance: 150       See FIM for current functional status  Therapy/Group: Individual Therapy  Liley Rake 12/07/2012, 3:13 PM

## 2012-12-07 NOTE — Progress Notes (Addendum)
Occupational Therapy Note  Patient Details  Name: [name] MRN: [id number] Date of Birth: 1965/06/23 Today's Date: 12/07/2012  Time:  1600-1630  (30 min) Individual session Pain:  None  Pt very drousey upon OT arrival.  He was lying in bed.  Pt. Sat up in bed and doffed and donned shirt after set up.  Pt. Used urinal but kept falling asleep and never did void.  Pt not oriented to situation.  Rembered my name after 3 minutes but not after 10 mins.   Left in bed with bed alarm on.      Humberto Seals 12/07/2012, 4:31 PM
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
## 2012-12-08 ENCOUNTER — Inpatient Hospital Stay (HOSPITAL_COMMUNITY): Payer: No Typology Code available for payment source | Admitting: *Deleted

## 2012-12-08 NOTE — Plan of Care (Signed)
Problem: RH PAIN MANAGEMENT Goal: RH STG PAIN MANAGED AT OR BELOW PT'S PAIN GOAL <2  Outcome: Not Progressing Patient continues to complain of pain greater than 2
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
## 2012-12-08 NOTE — Progress Notes (Signed)
Occupational Therapy Note  Patient Details  Name: [name] MRN: [id number] Date of Birth: January 14, 1966 Today's Date: 12/08/2012 1445-1530  45 min) Pain:  5/10 left wrist Individual session  Engaged in bed mobility, sitting on EOB, problem solving, selective attention.  Pt. Was moderate assist to go from supine to EOB.  Sat on EOB for and engaged in playing game of FISH.  Pt.said he did not know know how to play but caught on.  He maintained sustained attention while counting cards in mildly distracting environment.  He was unable to count his points at the end of the game.   Pt. Stated his left wrist was a 5/10 and asked for Pain meds.  Pt. Was mod assist going from sit to supine.     Humberto Seals 12/08/2012, 3:44 PM

## 2012-12-09 ENCOUNTER — Inpatient Hospital Stay (HOSPITAL_COMMUNITY): Payer: Self-pay | Admitting: *Deleted

## 2012-12-09 ENCOUNTER — Inpatient Hospital Stay (HOSPITAL_COMMUNITY): Payer: No Typology Code available for payment source | Admitting: Speech Pathology

## 2012-12-09 ENCOUNTER — Inpatient Hospital Stay (HOSPITAL_COMMUNITY): Payer: No Typology Code available for payment source

## 2012-12-09 ENCOUNTER — Inpatient Hospital Stay (HOSPITAL_COMMUNITY): Payer: No Typology Code available for payment source | Admitting: Physical Therapy

## 2012-12-09 ENCOUNTER — Ambulatory Visit (HOSPITAL_COMMUNITY): Payer: Self-pay

## 2012-12-09 DIAGNOSIS — S069X9A Unspecified intracranial injury with loss of consciousness of unspecified duration, initial encounter: Secondary | ICD-10-CM

## 2012-12-09 NOTE — Care Management (Signed)
[facility] Individual Statement of Services  Patient Name:  [name]  Date:  12/09/2012  Welcome to the [facility].  Our goal is to provide you with an individualized program based on your diagnosis and situation, designed to meet your specific needs.  With this comprehensive rehabilitation program, you will be expected to participate in at least 3 hours of rehabilitation therapies Monday-Friday, with modified therapy programming on the weekends.  Your rehabilitation program will include the following services:  Physical Therapy (PT), Occupational Therapy (OT), Speech Therapy (ST), 24 hour per day rehabilitation nursing, Therapeutic Recreaction (TR), Neuropsychology, Case Management (Social Worker), Rehabilitation Medicine, Nutrition Services and Pharmacy Services  Weekly team conferences will be held on Tuesdays to discuss your progress.  Your Social Worker will talk with you frequently to get your input and to update you on team discussions.  Team conferences with you and your family in attendance may also be held.  Expected length of stay: 3 weeks  Overall anticipated outcome: minimal assistance  Depending on your progress and recovery, your program may change. Your Social Worker will coordinate services and will keep you informed of any changes. Your Social Worker's name and contact numbers are listed  below.  The following services may also be recommended but are not provided by the [facility]:   Driving Evaluations  Home Health Rehabiltiation Services  Outpatient Rehabilitatation Services  Vocational Rehabilitation   Arrangements will be made to provide these services after discharge if needed.  Arrangements include referral to agencies that provide these services.  Your insurance has been verified to be:  Medicaid (*family planning program)  Your primary doctor is:  Dr. Gwenlyn Saran ([facility])  Pertinent information will be shared with your doctor and your insurance company.  Social Worker:  [place], [place] [phone] or ([phone]  Information discussed with and copy given to patient by: Amada Jupiter, 12/09/2012, 12:10 PM
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
## 2012-12-09 NOTE — Progress Notes (Signed)
Speech Language Pathology Daily Session Note  Patient Details  Name: [name] MRN: [id number] Date of Birth: May 22, 1966  Today's Date: 12/09/2012 Time: 1000-1100 Time Calculation (min): 60 min  Short Term Goals: Week 1: SLP Short Term Goal 1 (Week 1): Pt will orient to time, place and situation with Max A multimodal cues.  SLP Short Term Goal 2 (Week 1): Pt will demonstrate sustained attention to a functional task for 5 minutes with Max A verbal cues for redirection  SLP Short Term Goal 3 (Week 1): Pt will recall safety precautions with Max A multimodal cueing SLP Short Term Goal 4 (Week 1): Pt will maintain topic of conversation for 2 turns with Max A mutlimodal cueing  Skilled Therapeutic Interventions: Treatment focus on cognitive goals. Upon entering the room, the pt was awake in the wheelchair with his sister present. Pt required total A for orientation to place, time and situation but recalled information after a 10 minute delay with Min question cues. SLP provided external memory aids in pt's room to increase recall and carryover of information. Pt required Max verbal and tactile cues for sustained attention to task for ~ 1 minute due to lethargy. Pt participated in reading comprehension tasks at the word, sentence, paragraph and functional level and required Min A verbal and question cues for 100% accuracy, suspect performance was impacted by decreased attention.  Pt's sister present throughout the majority of the session and was educated on current cognitive deficits and goals of SLP intervention. She verbalized understanding and asked appropriate questions.  Pt transferred back to bed at end of session and required Min verbal cues for utilization of weight bearing precautions.     FIM:  Comprehension Comprehension Mode: Auditory Comprehension: 3-Understands basic 50 - 74% of the time/requires cueing 25 - 50%  of the time Expression Expression Mode: Verbal Expression:  3-Expresses basic 50 - 74% of the time/requires cueing 25 - 50% of the time. Needs to repeat parts of sentences. Social Interaction Social Interaction: 2-Interacts appropriately 25 - 49% of time - Needs frequent redirection. Problem Solving Problem Solving: 2-Solves basic 25 - 49% of the time - needs direction more than half the time to initiate, plan or complete simple activities Memory Memory: 2-Recognizes or recalls 25 - 49% of the time/requires cueing 51 - 75% of the time  Pain Pain Assessment Pain Assessment: 0-10 Pain Score: 5  Pain Type: Acute pain Pain Location: Back Pain Orientation: Mid;Lower Pain Descriptors / Indicators: Aching Pain Frequency: Occasional Pain Onset: Gradual Patients Stated Pain Goal: 3 Pain Intervention(s): Medication (See eMAR) (ultram 100mg po)  Therapy/Group: Individual Therapy  Vin Yonke 12/09/2012, 11:21 AM
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
## 2012-12-09 NOTE — Progress Notes (Signed)
Patient 1800 dose of ultram held due to patient difficult to arouse to eat and seeing a man in room when only RN in room. Reoriented patient to surroundings . Patient required assist with eating meal and completing task. Right leg hinged brace intact at this time . Patient denies pain . Continue with plan of care .              Cleotilde Neer

## 2012-12-09 NOTE — Progress Notes (Signed)
Physical Therapy Note  Patient Details  Name: [name] MRN: [id number] Date of Birth: 01/23/1966 Today's Date: 12/09/2012  Patient missed 30 minutes of skilled physical therapy this PM secondary to refusal. Will follow up as able.   [name] S. Kuba Shepherd, PT, DPT 12/09/2012, 4:10 PM

## 2012-12-09 NOTE — Plan of Care (Signed)
Problem: RH PAIN MANAGEMENT Goal: RH STG PAIN MANAGED AT OR BELOW PT'S PAIN GOAL <2  Outcome: Not Progressing Less than 5 at this time monitor excessive pain medication due to poor memory

## 2012-12-09 NOTE — Progress Notes (Signed)
Occupational Therapy Session Note  Patient Details  Name: [name] MRN: [id number] Date of Birth: [birth year]  Today's Date: 12/09/2012 Time: 0900-1000 Time Calculation (min): 60 min  Short Term Goals: Week 1:  OT Short Term Goal 1 (Week 1): Pt will attend to a functional tasks with sustained attention for 4 min  OT Short Term Goal 2 (Week 1): Pt will gather his own clothing at w/c level with supervision OT Short Term Goal 3 (Week 1): Pt will be oriented x3 with min questioning cues OT Short Term Goal 4 (Week 1): Pt will perform basic toilet transfer with min A consistently OT Short Term Goal 5 (Week 1): Pt will recall left UE and right LE precautions with max cuing with functional tasks  Skilled Therapeutic Interventions/Progress Updates:    Pt seated in w/c with sister present.  Pt had just had his hair cut and beard shaved and was ready to wash up and change clothing.  Pt not oriented to place or situation and required questioning cues to reorient.  Pt was unable to state weight bearing precautions. Pt stood at sink with steady assist to perform bathing tasks.  I placed my foot under his right heel to assure pt maintained weight bearing status.  Pt wanted to take off brace while standing and had to be reminded that the brace needed to remain on and reminded patient again about weight bearing restrictions. Pt attended to bathing and dressing tasks for approx 10 mins in a quiet environment.  Pt's conversation at times was tangential and patient thought I was a different person.  Pt did recall earlier PT session but could not recall therapist's name.  Focus on orientation, attention to task, safety awareness, and standing balance.  Therapy Documentation Precautions:  Precautions Precautions: Fall Required Braces or Orthoses: Other Brace/Splint Other Brace/Splint: short arm  cast on Lt. UE, Rt. Bledsoe brace Restrictions Weight Bearing Restrictions: Yes LUE Weight Bearing: Non  weight bearing RLE Weight Bearing: Touchdown weight bearing (needs max cues to maintain) Other Position/Activity Restrictions: No ROM to Rt. knee Pain: Pain Assessment Pain Assessment: 0-10 Pain Score: 5  Pain Type: Acute pain Pain Location: Back Pain Orientation: Right;Lower Pain Descriptors / Indicators: Aching Pain Frequency: Occasional Pain Onset: Gradual Patients Stated Pain Goal: 3 Pain Intervention(s): RN made aware;Repositioned  See FIM for current functional status  Therapy/Group: Individual Therapy  Rich Brave 12/09/2012, 10:14 AM

## 2012-12-09 NOTE — Progress Notes (Signed)
Patient ID: [name], male   DOB: [birth year], 47 y.o.   MRN: [id number] Subjective/Complaints: Up with therapies. No problems noted.  No pain c/os  Review of Systems      Objective: Vital Signs: Blood pressure 113/71, pulse 83, temperature 97.8 F (36.6 C), temperature source Oral, resp. rate 18, height 5\' 7"  (1.702 m), weight 63.7 kg (140 lb 6.9 oz), SpO2 98.00%. No results found. Results for orders placed during the hospital encounter of 12/05/12 (from the past 72 hour(s))  CBC WITH DIFFERENTIAL     Status: Abnormal   Collection Time    12/07/12  7:23 AM      Result Value Range   WBC 10.6 (*) 4.0 - 10.5 K/uL   RBC 3.00 (*) 4.22 - 5.81 MIL/uL   Hemoglobin 8.7 (*) 13.0 - 17.0 g/dL   HCT 78.2 (*) 95.6 - 21.3 %   MCV 86.7  78.0 - 100.0 fL   MCH 29.0  26.0 - 34.0 pg   MCHC 33.5  30.0 - 36.0 g/dL   RDW 08.6  57.8 - 46.9 %   Platelets 556 (*) 150 - 400 K/uL   Neutrophils Relative % 61  43 - 77 %   Lymphocytes Relative 26  12 - 46 %   Monocytes Relative 10  3 - 12 %   Eosinophils Relative 3  0 - 5 %   Basophils Relative 0  0 - 1 %   Neutro Abs 6.4  1.7 - 7.7 K/uL   Lymphs Abs 2.8  0.7 - 4.0 K/uL   Monocytes Absolute 1.1 (*) 0.1 - 1.0 K/uL   Eosinophils Absolute 0.3  0.0 - 0.7 K/uL   Basophils Absolute 0.0  0.0 - 0.1 K/uL   RBC Morphology POLYCHROMASIA PRESENT       Vitals reviewed.  HENT:  Head: Normocephalic.  Eyes:  Pupils reactive to light.  Neck: Normal range of motion. Neck supple. No thyromegaly present.  Cardiovascular: Normal rate and regular rhythm.  Pulmonary/Chest: Effort normal and breath sounds normal. No respiratory distress.  Abdominal: Soft. Bowel sounds are normal. He exhibits no distension.  Musculoskeletal:  Left thumb spica splint/cast in place  Neurological:  Patient is lethargic but arousable. He needed multiple cues for place and situation. He did follow simple one-step commands but inconsistent.  motor strength limited exam secondary to  left thumb spica cast and right thumb spica splint and right knee immobilizer.  5/5 bilateral deltoid bicep triceps and left hip flexor knee extensor ankle dorsiflexor plantar flexor  Right ankle dorsiflexor plantar flexor 4 minus/5  Sensation intact to light touch in both lower extremities upper extremities difficult to test secondary to his splints  Patient has paraphasic errors with speech.  Assessment/Plan: 1. 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 Physiatrist is providing close team supervision and 24 hour management of active medical problems listed below. Physiatrist and rehab team continue to assess barriers to discharge/monitor patient progress toward functional and medical goals. FIM: FIM - Bathing Bathing Steps Patient Completed: Chest;Right Arm;Left Arm;Front perineal area;Buttocks;Right upper leg;Left upper leg Bathing: 3: Mod-Patient completes 5-7 79f 10 parts or 50-74%  FIM - Upper Body Dressing/Undressing Upper body dressing/undressing steps patient completed: Thread/unthread right sleeve of pullover shirt/dresss;Thread/unthread left sleeve of pullover shirt/dress;Put head through opening of pull over shirt/dress;Pull shirt over trunk Upper body dressing/undressing: 5: Set-up assist to:  Obtain clothing/put away FIM - Lower Body Dressing/Undressing Lower body dressing/undressing steps patient completed: Thread/unthread left underwear leg;Thread/unthread right pants leg Lower body dressing/undressing: 2: Max-Patient completed 25-49% of tasks  FIM - Toileting Toileting steps completed by patient: Adjust clothing prior to toileting Toileting: 1: Total-Patient completed zero steps, helper did all 3  FIM - Diplomatic Services operational officer Devices: Grab bars Toilet Transfers: 4-To toilet/BSC: Min A  (steadying Pt. > 75%);4-From toilet/BSC: Min A (steadying Pt. > 75%)  FIM - Bed/Chair Transfer Bed/Chair Transfer Assistive Devices: Arm rests Bed/Chair Transfer: 2: Chair or W/C > Bed: Max A (lift and lower assist);3: Bed > Chair or W/C: Mod A (lift or lower assist)  FIM - Locomotion: Wheelchair Distance: 150 Locomotion: Wheelchair: 4: Travels 150 ft or more: maneuvers on rugs and over door sillls with minimal assistance (Pt.>75%) FIM - Locomotion: Ambulation Locomotion: Ambulation Assistive Devices: TEFL teacher Ambulation/Gait Assistance: 2: Max assist Locomotion: Ambulation: 1: Travels less than 50 ft with maximal assistance (Pt: 25 - 49%)  Comprehension Comprehension Mode: Auditory Comprehension: 3-Understands basic 50 - 74% of the time/requires cueing 25 - 50%  of the time  Expression Expression Mode: Verbal Expression: 3-Expresses basic 50 - 74% of the time/requires cueing 25 - 50% of the time. Needs to repeat parts of sentences.  Social Interaction Social Interaction: 2-Interacts appropriately 25 - 49% of time - Needs frequent redirection.  Problem Solving Problem Solving: 3-Solves basic 50 - 74% of the time/requires cueing 25 - 49% of the time  Memory Memory: 2-Recognizes or recalls 25 - 49% of the time/requires cueing 51 - 75% of the time Medical Problem List and Plan:  1. Traumatic brain injury/ multitrauma  2. DVT Prophylaxis/Anticoagulation: Subcutaneous Lovenox. Check vascular studies. Monitor platelet counts any signs of bleeding  3. Pain Management: Ultram 100 mg every 6 hours and oxycodone as needed for breakthrough pain. Tolerating therapy activities at present. 4. Neuropsych: This patient is not capable of making decisions on his own behalf.  5. Multiple facial fractures. Status post ORIF of right tripod fracture 12/04/2012  6. Right femur and acetabular as well as patella fracture. Status post ORIF right transverse anterior column with removal of broken  hardware and intramedullary nail right femur 11/29/2012. Patient is touchdown weightbearing  7. Open fracture dislocation, left thumb MCP joint status post ORIF and pinning of fracture with ulnar collateral ligament repair reconstruction of left from 11/29/2012. Patient is nonweightbearing  8. Acute blood loss anemia. Latest hemoglobins 7.8 and 7.9. Followup CBC Check Ortho BP 9. History of alcohol and drug abuse. Urine drug screen positive for cocaine.  Provide counseling       LOS (Days) 4 A FACE TO FACE EVALUATION WAS PERFORMED  Tesla Bochicchio T 12/09/2012, 9:44 AM

## 2012-12-09 NOTE — Progress Notes (Signed)
Agree with PTA.  Curt Oatis, PT [phone]  

## 2012-12-10 ENCOUNTER — Inpatient Hospital Stay (HOSPITAL_COMMUNITY): Payer: No Typology Code available for payment source | Admitting: Physical Therapy

## 2012-12-10 ENCOUNTER — Inpatient Hospital Stay (HOSPITAL_COMMUNITY): Payer: No Typology Code available for payment source

## 2012-12-10 ENCOUNTER — Inpatient Hospital Stay (HOSPITAL_COMMUNITY): Payer: No Typology Code available for payment source | Admitting: Occupational Therapy

## 2012-12-10 ENCOUNTER — Inpatient Hospital Stay (HOSPITAL_COMMUNITY): Payer: No Typology Code available for payment source | Admitting: *Deleted

## 2012-12-10 LAB — URINALYSIS, ROUTINE W REFLEX MICROSCOPIC
Bilirubin Urine: NEGATIVE
Glucose, UA: NEGATIVE mg/dL
Ketones, ur: NEGATIVE mg/dL
Leukocytes, UA: NEGATIVE
Protein, ur: NEGATIVE mg/dL

## 2012-12-10 MED ORDER — TRAMADOL HCL 50 MG PO TABS
50.0000 mg | ORAL_TABLET | Freq: Four times a day (QID) | ORAL | Status: DC
  Administered 2012-12-10 – 2012-12-27 (×66): 50 mg via ORAL
  Filled 2012-12-10 (×68): qty 1

## 2012-12-10 MED ORDER — OXYCODONE HCL 5 MG PO TABS: 5.0000 mg | ORAL_TABLET | ORAL | Status: DC | PRN

## 2012-12-10 MED ORDER — OXYCODONE HCL 5 MG PO TABS
15.0000 mg | ORAL_TABLET | ORAL | Status: DC | PRN
  Administered 2012-12-22 – 2012-12-23 (×3): 15 mg via ORAL
  Filled 2012-12-10 (×4): qty 3

## 2012-12-10 MED ORDER — OXYCODONE HCL 5 MG PO TABS
10.0000 mg | ORAL_TABLET | ORAL | Status: DC | PRN
  Administered 2012-12-11 – 2012-12-12 (×3): 10 mg via ORAL
  Administered 2012-12-12: 5 mg via ORAL
  Administered 2012-12-13 – 2012-12-27 (×11): 10 mg via ORAL
  Administered 2012-12-27: 5 mg via ORAL
  Filled 2012-12-10 (×2): qty 2
  Filled 2012-12-10: qty 1
  Filled 2012-12-10 (×13): qty 2
  Filled 2012-12-10 (×2): qty 1
  Filled 2012-12-10: qty 2

## 2012-12-10 MED ORDER — OXYCODONE HCL 5 MG PO TABS
20.0000 mg | ORAL_TABLET | ORAL | Status: DC | PRN
  Administered 2012-12-17 – 2012-12-21 (×8): 20 mg via ORAL
  Filled 2012-12-10 (×7): qty 4

## 2012-12-10 NOTE — Progress Notes (Signed)
Patient ID: [name], male   DOB: 1966-05-01, 47 y.o.   MRN: [id number] Subjective/Complaints: Right leg sore, heavy. Slept fairly well. More alert    Review of Systems      Objective: Vital Signs: Blood pressure 103/72, pulse 121, temperature 98.7 F (37.1 C), temperature source Oral, resp. rate 20, height 5\' 7"  (1.702 m), weight 63.7 kg (140 lb 6.9 oz), SpO2 100.00%. No results found. No results found for this or any previous visit (from the past 72 hour(s)).   Vitals reviewed.  HENT:  Head: Normocephalic.  Eyes:  Pupils reactive to light.  Neck: Normal range of motion. Neck supple. No thyromegaly present.  Cardiovascular: Normal rate and regular rhythm.  Pulmonary/Chest: Effort normal and breath sounds normal. No respiratory distress.  Abdominal: Soft. Bowel sounds are normal. He exhibits no distension.  Musculoskeletal:  Left thumb spica splint/cast in place  Neurological:   . He needed multiple cues for place and situation. He did follow simple one-step commands but inconsistent.  motor strength limited exam secondary to left thumb spica cast and right thumb spica splint and right knee immobilizer.  5/5 bilateral deltoid bicep triceps and left hip flexor knee extensor ankle dorsiflexor plantar flexor  Right ankle dorsiflexor plantar flexor 4 minus/5  Sensation intact to light touch in both lower extremities upper extremities difficult to test secondary to his splints  Patient is distractible. Needs redirection. Speech dysarthric Skin: clean and intact right thigh wound.  Assessment/Plan: 1. 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 Physiatrist is providing close team supervision and 24 hour management of active medical problems listed below. Physiatrist and rehab team continue to assess barriers to  discharge/monitor patient progress toward functional and medical goals. FIM: FIM - Bathing Bathing Steps Patient Completed: Chest;Right Arm;Left Arm;Abdomen;Front perineal area;Buttocks;Left upper leg;Right upper leg Bathing: 4: Min-Patient completes 8-9 63f 10 parts or 75+ percent  FIM - Upper Body Dressing/Undressing Upper body dressing/undressing steps patient completed: Thread/unthread right sleeve of pullover shirt/dresss;Thread/unthread left sleeve of pullover shirt/dress;Put head through opening of pull over shirt/dress;Pull shirt over trunk Upper body dressing/undressing: 5: Set-up assist to: Obtain clothing/put away FIM - Lower Body Dressing/Undressing Lower body dressing/undressing steps patient completed: Thread/unthread left underwear leg;Pull underwear up/down;Thread/unthread left pants leg;Pull pants up/down;Don/Doff left sock;Don/Doff left shoe Lower body dressing/undressing: 3: Mod-Patient completed 50-74% of tasks  FIM - Toileting Toileting steps completed by patient: Adjust clothing prior to toileting;Performs perineal hygiene;Adjust clothing after toileting Toileting: 5: Set-up assist to: Obtain supplies  FIM - Diplomatic Services operational officer Devices: Grab bars Toilet Transfers: 4-To toilet/BSC: Min A (steadying Pt. > 75%);4-From toilet/BSC: Min A (steadying Pt. > 75%)  FIM - Bed/Chair Transfer Bed/Chair Transfer Assistive Devices: Arm rests Bed/Chair Transfer: 0: Activity did not occur  FIM - Locomotion: Wheelchair Distance: 150 Locomotion: Wheelchair: 4: Travels 150 ft or more: maneuvers on rugs and over door sillls with minimal assistance (Pt.>75%) FIM - Locomotion: Ambulation Locomotion: Ambulation Assistive Devices: TEFL teacher Ambulation/Gait Assistance: 3: Mod assist Locomotion: Ambulation: 0: Activity did not occur  Comprehension Comprehension Mode: Auditory Comprehension: 3-Understands basic 50 - 74% of the time/requires cueing 25 - 50%   of the time  Expression Expression Mode: Verbal Expression: 3-Expresses basic 50 - 74% of the time/requires cueing 25 - 50% of the time. Needs to repeat parts of sentences.  Social Interaction Social Interaction: 2-Interacts  appropriately 25 - 49% of time - Needs frequent redirection.  Problem Solving Problem Solving: 2-Solves basic 25 - 49% of the time - needs direction more than half the time to initiate, plan or complete simple activities  Memory Memory: 2-Recognizes or recalls 25 - 49% of the time/requires cueing 51 - 75% of the time Medical Problem List and Plan:  1. Traumatic brain injury/ multitrauma  2. DVT Prophylaxis/Anticoagulation: Subcutaneous Lovenox. Check vascular studies. Monitor platelet counts any signs of bleeding  3. Pain Management: Ultram decreased to 50 mg every 6 hours (sedation) and oxycodone as needed for breakthrough pain. Tolerating therapy activities at present. 4. Neuropsych: This patient is not capable of making decisions on his own behalf.  5. Multiple facial fractures. Status post ORIF of right tripod fracture 12/04/2012  6. Right femur and acetabular as well as patella fracture. Status post ORIF right transverse anterior column with removal of broken hardware and intramedullary nail right femur 11/29/2012. Patient is touchdown weightbearing  7. Open fracture dislocation, left thumb MCP joint status post ORIF and pinning of fracture with ulnar collateral ligament repair reconstruction of left from 11/29/2012. Patient is nonweightbearing  8. Acute blood loss anemia. Latest hemoglobin up to 8.7 9. History of alcohol and drug abuse. Urine drug screen positive for cocaine.  Provide counseling       LOS (Days) 5 A FACE TO FACE EVALUATION WAS PERFORMED  SWARTZ,ZACHARY T 12/10/2012, 9:26 AM

## 2012-12-10 NOTE — Progress Notes (Signed)
Occupational Therapy Session Note  Patient Details  Name: [name] MRN: [id number] Date of Birth: March 31, 1966  Today's Date: 12/10/2012 Time: 1445-1530 Time Calculation (min): 45 min  Short Term Goals: Week 1:  OT Short Term Goal 1 (Week 1): Pt will attend to a functional tasks with sustained attention for 4 min  OT Short Term Goal 2 (Week 1): Pt will gather his own clothing at w/c level with supervision OT Short Term Goal 3 (Week 1): Pt will be oriented x3 with min questioning cues OT Short Term Goal 4 (Week 1): Pt will perform basic toilet transfer with min A consistently OT Short Term Goal 5 (Week 1): Pt will recall left UE and right LE precautions with max cuing with functional tasks  Skilled Therapeutic Interventions/Progress Updates:  Patient resting in bed with HOB up and SLP finishing her session with patient.  Patient engaged in bed mobility, adhering to weight bearing precautions,simple tabletop new learning activity, cognitive tasks to include sustained attention, orientation, concentration, and memory.  Patient required max cues to maintain weight bearing precautions, able to demonstrated sustained attention to new learning task for 2-4 minutes at a time before required redirection, required max cues for orientation yet with ~50% of the questioning cues, patient able to verbalized correct orientation answers.  Patient resting in bed with RLE elevated on pillows, HOB elevated, bed alarm activated and all items within reach.  Therapy Documentation Precautions:  Precautions Precautions: Fall Required Braces or Orthoses: Other Brace/Splint Other Brace/Splint: short arm  cast on Lt. UE, Rt. Bledsoe brace Restrictions Weight Bearing Restrictions: Yes LUE Weight Bearing: Non weight bearing RLE Weight Bearing: Touchdown weight bearing Other Position/Activity Restrictions: No ROM to Rt. knee Pain: Reports back pain, not rated, repositioned and rest.  Therapy/Group:  Individual Therapy  Jomes Giraldo 12/10/2012, 3:52 PM

## 2012-12-10 NOTE — Progress Notes (Signed)
Physical Therapy Session Note  Patient Details  Name: [name] MRN: [id number] Date of Birth: 1965-12-04  Today's Date: 12/10/2012 Time: 0831-0900 Time Calculation (min): 29 min  Short Term Goals: Week 1:  PT Short Term Goal 1 (Week 1): Pt will perform sit <> stand from bed, wheelchair with min assist.  PT Short Term Goal 2 (Week 1): Pt will verbalize both weight bearing precautions and be able to maintain during mobility > 20% of session with <3 verbal cues.  PT Short Term Goal 3 (Week 1): Pt will ambulate 22' with PFRW and min assist while maintaining preacuations with max verbal cues.   Skilled Therapeutic Interventions/Progress Updates:  Tx focused on WC mobility in controlled environment and on carpeting, as well as seated cognitive tasks. Pt limited in mobility due to pain today. Pt challenged to navigate tight turns and closed spaces with WC, needing min A for most difficult situations.  Pt provided problem solving situations in functional environment, simple pathfinding around unit in [facility], and seated game to focus on sustained attention and simple addition. Pt needing Mod A for problem solving overall, and was somewhat confused at times with answers to simple questions.  Pt left at nursing station.      Therapy Documentation Precautions:  Precautions Precautions: Fall Required Braces or Orthoses: Other Brace/Splint Other Brace/Splint: short arm  cast on Lt. UE, Rt. Bledsoe brace Restrictions Weight Bearing Restrictions: Yes LUE Weight Bearing: Non weight bearing RLE Weight Bearing: Touchdown weight bearing (needs max cues to maintain) Other Position/Activity Restrictions: No ROM to Rt. knee General:   Pain:9/10 - repositioned, nursing aware but weary of increasing pain meds due to increased letharg today.     Locomotion : Wheelchair Mobility Distance: 150   See FIM for current functional status  Therapy/Group: Individual Therapy  Clydene Laming, PT,  DPT  12/10/2012, 9:02 AM

## 2012-12-10 NOTE — Progress Notes (Signed)
Occupational Therapy Session Note  Patient Details  Name: [name] MRN: [id number] Date of Birth: [birth year]  Today's Date: 12/10/2012 Time: 0900-0957 Time Calculation (min): 57 min  Short Term Goals: Week 1:  OT Short Term Goal 1 (Week 1): Pt will attend to a functional tasks with sustained attention for 4 min  OT Short Term Goal 2 (Week 1): Pt will gather his own clothing at w/c level with supervision OT Short Term Goal 3 (Week 1): Pt will be oriented x3 with min questioning cues OT Short Term Goal 4 (Week 1): Pt will perform basic toilet transfer with min A consistently OT Short Term Goal 5 (Week 1): Pt will recall left UE and right LE precautions with max cuing with functional tasks  Skilled Therapeutic Interventions/Progress Updates:    Pt seated in w/c with sister and mother at side upon arrival.  Pt exhibited increased confusion this morning and required max verbal cues for orientation.  Pt recalled room number but could not recall what building or situation.  Pt erquired mod verbal cues to attend to BADLs this morning and max verbal cues to redirect to task.  Pt frequently relating past events that could not be verified for veracity and required max verbal cues to change the topic.  Pt performs sit<>stand with mod A and exhibits difficulty adhering to WBing precautions.  Pt states that he could stand better if he didn't have the brace on.  Pt reeducated on the purpose of the brace and his WBing precautions.  Pt verbalizes understanding but requires max verbal cues and physical assistance to adhere to precautions.  Pt transitioned to day room to engage in simple card game requiring sustained attention and simple math Hotel manager).  Pt maintained sustained attention approx 2 mins requiring min verbal cues for redirection.  Therapy Documentation Precautions:  Precautions Precautions: Fall Required Braces or Orthoses: Other Brace/Splint Other Brace/Splint: short arm  cast on Lt.  UE, Rt. Bledsoe brace Restrictions Weight Bearing Restrictions: Yes LUE Weight Bearing: Non weight bearing RLE Weight Bearing: Touchdown weight bearing (needs max cues to maintain) Other Position/Activity Restrictions: No ROM to Rt. knee Pain: Pain Assessment Pain Assessment: 0-10 Pain Score: 4  Pain Type: Acute pain Pain Location: Back Pain Orientation: Lower Pain Descriptors / Indicators: Aching Patients Stated Pain Goal: 3 Pain Intervention(s): RN made aware  See FIM for current functional status  Therapy/Group: Individual Therapy  Session 2 Time: 1300-1330 Pt c/o 3/10 pain in back; RN aware; repositioned Individual Therapy  Pt missed 30 mins skilled OT services.  Pt difficult to arouse and did not engage/respond adequately to actively participate in therapy. Pt in bed asleep upon arrival.  Pt required max verbal/tactile cues to arouse and engage with therapist.  Focus on arousal and active participation in bed mobility with attempt to sit EOB.  Pt stated he was too tired to sit up.  Attempted to engage patient in conversation but patient could not keep eyes open and did not coherently respond to questions. Pt not oriented to place or situation and required max verbal cues.   [name] [facility] 12/10/2012, 10:06 AM

## 2012-12-10 NOTE — Progress Notes (Signed)
Physical Therapy Session Note  Patient Details  Name: [name] MRN: [id number] Date of Birth: Jan 12, 1966  Today's Date: 12/10/2012 Time: 9562-1308 Time Calculation (min): 55 min  Short Term Goals: Week 1:  PT Short Term Goal 1 (Week 1): Pt will perform sit <> stand from bed, wheelchair with min assist.  PT Short Term Goal 2 (Week 1): Pt will verbalize both weight bearing precautions and be able to maintain during mobility > 20% of session with <3 verbal cues.  PT Short Term Goal 3 (Week 1): Pt will ambulate 13' with PFRW and min assist while maintaining preacuations with max verbal cues.   Skilled Therapeutic Interventions/Progress Updates:    Slow to arouse again this morning, confusion continues (slightly worse this morning?). Disoriented to situation and injuries, sentences do not flow or make sense. Brace readjusted as it had slid down, pt forget why he needs brace <5 min after reviewing injuries and need for brace. Pt attempted to eat meat from a dish brought from home that had been in room > 24 hours nonrefriderated, pt could not reason that the food was not safe. Pt very slow getting out of bed, pt took over 15 min to go supine to sit despite verbal/tactile cues.  Ambulation x 4', 66' with platform walker and Rt. LE on therapists foot, still with difficulty maintaining precautions. Wheelchair mobility x 120'. More internally distracted by pain, less motivated this morning than yesterday.  Therapy Documentation Precautions:  Precautions Precautions: Fall Required Braces or Orthoses: Other Brace/Splint Other Brace/Splint: short arm  cast on Lt. UE, Rt. Bledsoe brace Restrictions Weight Bearing Restrictions: Yes LUE Weight Bearing: Non weight bearing RLE Weight Bearing: Touchdown weight bearing Other Position/Activity Restrictions: No ROM to Rt. knee Pain: Pain Assessment Pain Score: 6 Pain Type: Acute pain Pain Location: leg Pain Descriptors / Indicators: Aching Pain  Intervention(s): RN made aware  See FIM for current functional status  Therapy/Group: individual   [name] 12/10/2012, 12:11 PM

## 2012-12-10 NOTE — Progress Notes (Signed)
Speech Language Pathology Daily Session Note  Patient Details  Name: [name] MRN: [id number] Date of Birth: January 11, 1966  Today's Date: 12/10/2012 Time: 4098-1191 Time Calculation (min): 15 min  Short Term Goals: Week 1: SLP Short Term Goal 1 (Week 1): Pt will orient to time, place and situation with Max A multimodal cues.  SLP Short Term Goal 2 (Week 1): Pt will demonstrate sustained attention to a functional task for 5 minutes with Max A verbal cues for redirection  SLP Short Term Goal 3 (Week 1): Pt will recall safety precautions with Max A multimodal cueing SLP Short Term Goal 4 (Week 1): Pt will maintain topic of conversation for 2 turns with Max A mutlimodal cueing  Skilled Therapeutic Interventions: Skilled treatment session focused on cognitive goals. SLP facilitated session with discussion regarding orientation and patient was able to spontaneously recall state, city and hospital without cues SLP then facilitated session with verbal and visual cues to utilize written aid for name of hospital and reason for hospital hospitalization.  SLP also requested information regarding weight bearing restrictions but patient was unable to recall any restrictions.   FIM:  Comprehension Comprehension Mode: Auditory Comprehension: 2-Understands basic 25 - 49% of the time/requires cueing 51 - 75% of the time Expression Expression Mode: Verbal Expression: 3-Expresses basic 50 - 74% of the time/requires cueing 25 - 50% of the time. Needs to repeat parts of sentences. Social Interaction Social Interaction: 3-Interacts appropriately 50 - 74% of the time - May be physically or verbally inappropriate. Problem Solving Problem Solving: 2-Solves basic 25 - 49% of the time - needs direction more than half the time to initiate, plan or complete simple activities Memory Memory: 2-Recognizes or recalls 25 - 49% of the time/requires cueing 51 - 75% of the time FIM - Eating Eating Activity: 5:  Set-up assist for open containers  Pain Pain Assessment Pain Assessment: No/denies pain  Therapy/Group: Individual Therapy  Charlane Ferretti., CCC-SLP [phone]  Humbert Morozov 12/10/2012, 4:15 PM

## 2012-12-11 ENCOUNTER — Inpatient Hospital Stay (HOSPITAL_COMMUNITY): Payer: No Typology Code available for payment source | Admitting: Speech Pathology

## 2012-12-11 ENCOUNTER — Inpatient Hospital Stay (HOSPITAL_COMMUNITY): Payer: No Typology Code available for payment source

## 2012-12-11 ENCOUNTER — Inpatient Hospital Stay (HOSPITAL_COMMUNITY): Payer: No Typology Code available for payment source | Admitting: Physical Therapy

## 2012-12-11 DIAGNOSIS — S069X9A Unspecified intracranial injury with loss of consciousness of unspecified duration, initial encounter: Secondary | ICD-10-CM

## 2012-12-11 DIAGNOSIS — S069XAA Unspecified intracranial injury with loss of consciousness status unknown, initial encounter: Secondary | ICD-10-CM

## 2012-12-11 NOTE — Progress Notes (Signed)
Speech Language Pathology Daily Session Note  Patient Details  Name: [name] MRN: [id number] Date of Birth: 08/05/1965  Today's Date: 12/11/2012 Time: 0800-0855 Time Calculation (min): 55 min  Short Term Goals: Week 1: SLP Short Term Goal 1 (Week 1): Pt will orient to time, place and situation with Max A multimodal cues.  SLP Short Term Goal 2 (Week 1): Pt will demonstrate sustained attention to a functional task for 5 minutes with Max A verbal cues for redirection  SLP Short Term Goal 3 (Week 1): Pt will recall safety precautions with Max A multimodal cueing SLP Short Term Goal 4 (Week 1): Pt will maintain topic of conversation for 2 turns with Max A mutlimodal cueing  Skilled Therapeutic Interventions: Treatment focus on dysphagia and cognitive goals. SLP facilitated session by providing extra time for tray set-up of breakfast meal. Pt consumed Dys. 3 textures with thin liquids without overt s/s of aspiration. Pt also consumed trails of regular textures and demonstrated efficient mastication without overt s/s of aspiration. Pt was independently oriented to place, situation and month and sequenced 4 step picture cards with 100% accuracy and 6 step picture cards with Max A verbal and visual cues.  Pt independently requested to use the bathroom and required Max A multimodal cueing for safety with transfer and to adhere to weightbearing precautions.    FIM:  Comprehension Comprehension Mode: Auditory Comprehension: 4-Understands basic 75 - 89% of the time/requires cueing 10 - 24% of the time Expression Expression Mode: Verbal Expression: 3-Expresses basic 50 - 74% of the time/requires cueing 25 - 50% of the time. Needs to repeat parts of sentences. Social Interaction Social Interaction: 3-Interacts appropriately 50 - 74% of the time - May be physically or verbally inappropriate. Problem Solving Problem Solving: 2-Solves basic 25 - 49% of the time - needs direction more than half  the time to initiate, plan or complete simple activities Memory Memory: 2-Recognizes or recalls 25 - 49% of the time/requires cueing 51 - 75% of the time FIM - Eating Eating Activity: 6: Swallowing techniques: self-managed  Pain No/Denies Pain  Therapy/Group: Individual Therapy  Mikki Ziff 12/11/2012, 9:03 AM
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
## 2012-12-11 NOTE — Progress Notes (Signed)
Physical Therapy Note  Patient Details  Name: ROC STREETT MRN: [id number] Date of Birth: 05-30-1965 Today's Date: 12/11/2012  4098-1191 (55 minutes) individual Pain: no reported Pain Other: NWB LT UE/ TDWB RT LE Focus of treatment: sit to stand training maintaining weight bearing precautions as above; gait training; standing tolerance performing cognitive activity (recall) Treatment: Pt up in wc ; wc propulsion using Lt LE/ RT UE 120 feet ( max vcs to use RT UE to assist LE); gait 40 feet X 4 LT PFRW min assist with second person to maintain TDWB or less; standing hitting ball with RT UE naming food starting with letters of alphabet (no cues required).    1345 - 1425 (40 minutes) individual Pain: no reported pain Focus of treatment: gait training Treatment: pt able to use urinal without assist; gait 30 feet X 3 LT PFRW min assist with max assist to prevent PWB RT LE; wc mobility SBA on unit using LT LE.  Andria Head,JIM 12/11/2012, 9:57 AM

## 2012-12-11 NOTE — Progress Notes (Addendum)
Occupational Therapy Session Note  Patient Details  Name: [name] MRN: [id number] Date of Birth: Jan 17, 1966  Today's Date: 12/11/2012  Session 1 Time: 0700-0800 Time Calculation (min): 60 min  Short Term Goals: Week 1:  OT Short Term Goal 1 (Week 1): Pt will attend to a functional tasks with sustained attention for 4 min  OT Short Term Goal 2 (Week 1): Pt will gather his own clothing at w/c level with supervision OT Short Term Goal 3 (Week 1): Pt will be oriented x3 with min questioning cues OT Short Term Goal 4 (Week 1): Pt will perform basic toilet transfer with min A consistently OT Short Term Goal 5 (Week 1): Pt will recall left UE and right LE precautions with max cuing with functional tasks  Skilled Therapeutic Interventions/Progress Updates:    Pt in bed asleep but easily aroused.  Pt agreeable to participating in bathing and dressing with sit<>stand at sink.  Pt sat EOB with supervision and removed shirt before transferring to w/c with stand pivot transfer.  Pt required max verbal cues for weight bearing precautions during transfer.  Pt stood at sink to complete bathing tasks, again requiring max verbal and tactile cues for adherence to weight bearing precautions.  Pt stated that he could stand better if he didn't have Bledsoe brace on.  I reminded patient that the doctor stated that it had to remain on at all times so his leg could heal.  Pt required max questioning cues for orientation to place and situation.  Focus on activity tolerance, orientation, safety awareness, standing balance, and compensatory strategies.  Therapy Documentation Precautions:  Precautions Precautions: Fall Required Braces or Orthoses: Other Brace/Splint Other Brace/Splint: short arm  cast on Lt. UE, Rt. Bledsoe brace Restrictions Weight Bearing Restrictions: Yes LUE Weight Bearing: Non weight bearing RLE Weight Bearing: Touchdown weight bearing Other Position/Activity Restrictions: No ROM to  Rt. knee Pain: Pain Assessment Pain Assessment: 0-10 Pain Score: 8  Pain Location: Neck Pain Orientation: Posterior Pain Descriptors / Indicators: Aching Pain Onset: On-going Patients Stated Pain Goal: 2 Pain Intervention(s): RN made aware  See FIM for current functional status  Therapy/Group: Individual Therapy  Session 2 Time: 1300-1345 Pt c/o muscle tightness in upper traps  area; soft tissue mobilization Individual Therapy  Pt resting in bed upon arrival.  Pt's Bledsoe brace had slipped down on leg.  As I was repositoining his Brace the patient told me to go ahead and take it off  Because he didn't need it.  Reeducated patient on purpose of brace and doctor's orders.  Pt transferred to w/c with stand pivot transfer.  Pt rolled to gym for dynamic standing activities with focus on adhering to weight bearing precautions.  Pt required max verbal and tactile cues.  [name] [facility] 12/11/2012, 8:03 AM

## 2012-12-11 NOTE — Progress Notes (Signed)
Social Work Patient ID: [name], male   DOB: 1966-01-13, 47 y.o.   MRN: [id number]  Have spoken with SSD reviewer who has not received any records as of yet from this hospitalization - faxing notes to, hopefully, push this review along a little faster as pt is SNF.  Byron Tipping, LCSW

## 2012-12-12 ENCOUNTER — Inpatient Hospital Stay (HOSPITAL_COMMUNITY): Payer: No Typology Code available for payment source

## 2012-12-12 ENCOUNTER — Inpatient Hospital Stay (HOSPITAL_COMMUNITY): Payer: Self-pay

## 2012-12-12 ENCOUNTER — Inpatient Hospital Stay (HOSPITAL_COMMUNITY): Payer: No Typology Code available for payment source | Admitting: Physical Therapy

## 2012-12-12 ENCOUNTER — Inpatient Hospital Stay (HOSPITAL_COMMUNITY): Payer: No Typology Code available for payment source | Admitting: Speech Pathology

## 2012-12-12 ENCOUNTER — Inpatient Hospital Stay (HOSPITAL_COMMUNITY): Payer: No Typology Code available for payment source | Admitting: Occupational Therapy

## 2012-12-12 LAB — CREATININE, SERUM
Creatinine, Ser: 0.95 mg/dL (ref 0.50–1.35)
GFR calc non Af Amer: >90 mL/min (ref >90)

## 2012-12-12 LAB — URINE CULTURE: Colony Count: >=100000

## 2012-12-12 NOTE — Progress Notes (Signed)
  Occupational Therapy Note  Patient Details  Name: [name] MRN: [id number] Date of Birth: 03/25/1966 Today's Date: 12/12/2012  [employer] with SLP  [phone] No c/o pain Focus on social setting for self feeding with encouragement of social appropriate conversation, simple problem solving, orientation, working memory and memory of morning events with max A.   [name] 12/12/2012, 4:39 PM

## 2012-12-12 NOTE — Progress Notes (Signed)
Fine sutures remain at lower abd. area; right ear, right scalp. CDI. PAC aware.

## 2012-12-12 NOTE — Progress Notes (Signed)
Patient ID: [name], male   DOB: March 13, 1966, 47 y.o.   MRN: [id number] Subjective/Complaints: Still with leg soreness today. Staples out. Up with therapy this am.    Review of Systems      Objective: Vital Signs: Blood pressure 96/62, pulse 80, temperature 98.7 F (37.1 C), temperature source Oral, resp. rate 14, height 5\' 7"  (1.702 m), weight 79 kg (174 lb 2.6 oz), SpO2 100.00%. No results found. Results for orders placed during the hospital encounter of 12/05/12 (from the past 72 hour(s))  URINALYSIS, ROUTINE W REFLEX MICROSCOPIC     Status: Abnormal   Collection Time    12/10/12  5:24 PM      Result Value Range   Color, Urine AMBER (*) YELLOW   Comment: BIOCHEMICALS MAY BE AFFECTED BY COLOR   APPearance CLEAR  CLEAR   Specific Gravity, Urine 1.017  1.005 - 1.030   pH 6.5  5.0 - 8.0   Glucose, UA NEGATIVE  NEGATIVE mg/dL   Hgb urine dipstick NEGATIVE  NEGATIVE   Bilirubin Urine NEGATIVE  NEGATIVE   Ketones, ur NEGATIVE  NEGATIVE mg/dL   Protein, ur NEGATIVE  NEGATIVE mg/dL   Urobilinogen, UA 1.0  0.0 - 1.0 mg/dL   Nitrite NEGATIVE  NEGATIVE   Leukocytes, UA NEGATIVE  NEGATIVE   Comment: MICROSCOPIC NOT DONE ON URINES WITH NEGATIVE PROTEIN, BLOOD, LEUKOCYTES, NITRITE, OR GLUCOSE <1000 mg/dL.  URINE CULTURE     Status: None   Collection Time    12/10/12  5:24 PM      Result Value Range   Specimen Description URINE, CLEAN CATCH     Special Requests NONE     Culture  Setup Time 12/10/2012 18:58     Colony Count >=100,000 COLONIES/ML     Culture       Value: Multiple bacterial morphotypes present, none predominant. Suggest appropriate recollection if clinically indicated.   Report Status 12/12/2012 FINAL    CREATININE, SERUM     Status: None   Collection Time    12/12/12  6:10 AM      Result Value Range   Creatinine, Ser 0.95  0.50 - 1.35 mg/dL   GFR calc non Af Amer >90  >90 mL/min   GFR calc Af Amer >90  >90 mL/min   Comment:            The eGFR has been  calculated     using the CKD EPI equation.     This calculation has not been     validated in all clinical     situations.     eGFR's persistently     <90 mL/min signify     possible Chronic Kidney Disease.     Vitals reviewed.  HENT:  Head: Normocephalic.  Eyes:  Pupils reactive to light.  Neck: Normal range of motion. Neck supple. No thyromegaly present.  Cardiovascular: Normal rate and regular rhythm.  Pulmonary/Chest: Effort normal and breath sounds normal. No respiratory distress.  Abdominal: Soft. Bowel sounds are normal. He exhibits no distension.  Musculoskeletal:  Left thumb spica splint/cast in place  Neurological:   . He needed multiple cues for place and situation. .  motor strength limited exam secondary to left thumb spica cast and right thumb spica splint and right knee immobilizer.  5/5 bilateral deltoid bicep triceps and left hip flexor knee extensor ankle dorsiflexor plantar flexor  Right ankle dorsiflexor plantar flexor 4 minus/5  Sensation intact to light touch in both lower extremities  upper extremities difficult to test secondary to his splints  Patient is distractible. Needs redirection. Speech dysarthric Skin: clean and intact right thigh wound. Staples/sutures in place  Assessment/Plan: 1. 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 Physiatrist is providing close team supervision and 24 hour management of active medical problems listed below. Physiatrist and rehab team continue to assess barriers to discharge/monitor patient progress toward functional and medical goals. FIM: FIM - Bathing Bathing Steps Patient Completed: Chest;Right Arm;Left Arm;Abdomen;Front perineal area;Buttocks;Right upper leg;Left upper leg;Left lower leg (including foot) Bathing: 4: Min-Patient completes 8-9 36f 10 parts  or 75+ percent  FIM - Upper Body Dressing/Undressing Upper body dressing/undressing steps patient completed: Thread/unthread right sleeve of pullover shirt/dresss;Thread/unthread left sleeve of pullover shirt/dress;Put head through opening of pull over shirt/dress;Pull shirt over trunk Upper body dressing/undressing: 5: Supervision: Safety issues/verbal cues FIM - Lower Body Dressing/Undressing Lower body dressing/undressing steps patient completed: Thread/unthread left underwear leg;Pull underwear up/down;Thread/unthread left pants leg;Pull pants up/down;Don/Doff left sock;Don/Doff left shoe Lower body dressing/undressing: 3: Mod-Patient completed 50-74% of tasks  FIM - Toileting Toileting steps completed by patient: Adjust clothing prior to toileting;Performs perineal hygiene;Adjust clothing after toileting Toileting: 5: Set-up assist to: Obtain supplies  FIM - Diplomatic Services operational officer Devices: Grab bars Toilet Transfers: 4-To toilet/BSC: Min A (steadying Pt. > 75%);4-From toilet/BSC: Min A (steadying Pt. > 75%)  FIM - Bed/Chair Transfer Bed/Chair Transfer Assistive Devices: Arm rests Bed/Chair Transfer: 3: Supine > Sit: Mod A (lifting assist/Pt. 50-74%/lift 2 legs;3: Chair or W/C > Bed: Mod A (lift or lower assist)  FIM - Locomotion: Wheelchair Distance: 150 Locomotion: Wheelchair: 4: Travels 150 ft or more: maneuvers on rugs and over door sillls with minimal assistance (Pt.>75%) FIM - Locomotion: Ambulation Locomotion: Ambulation Assistive Devices: TEFL teacher Ambulation/Gait Assistance: 3: Mod assist Locomotion: Ambulation: 1: Travels less than 50 ft with moderate assistance (Pt: 50 - 74%)  Comprehension Comprehension Mode: Auditory Comprehension: 4-Understands basic 75 - 89% of the time/requires cueing 10 - 24% of the time  Expression Expression Mode: Verbal Expression: 3-Expresses basic 50 - 74% of the time/requires cueing 25 - 50% of the time. Needs  to repeat parts of sentences.  Social Interaction Social Interaction: 3-Interacts appropriately 50 - 74% of the time - May be physically or verbally inappropriate.  Problem Solving Problem Solving: 2-Solves basic 25 - 49% of the time - needs direction more than half the time to initiate, plan or complete simple activities  Memory Memory: 2-Recognizes or recalls 25 - 49% of the time/requires cueing 51 - 75% of the time Medical Problem List and Plan:  1. Traumatic brain injury/ multitrauma  2. DVT Prophylaxis/Anticoagulation: Subcutaneous Lovenox. Check vascular studies. Monitor platelet counts any signs of bleeding  3. Pain Management: Ultram decreased to 50 mg every 6 hours (sedation) and oxycodone as needed for breakthrough pain. Tolerating therapy activities at present. 4. Neuropsych: This patient is not capable of making decisions on his own behalf.  5. Multiple facial fractures. Status post ORIF of right tripod fracture 12/04/2012    6. Right femur and acetabular as well as patella fracture. Status post ORIF right transverse anterior column with removal of broken hardware and intramedullary nail right femur 11/29/2012. Patient is touchdown weightbearing   -dc staples/sutures 7. Open fracture dislocation, left thumb MCP joint status post ORIF and pinning of fracture with ulnar collateral ligament  repair reconstruction of left from 11/29/2012. Patient is nonweightbearing  8. Acute blood loss anemia. Latest hemoglobin up to 8.7 9. History of alcohol and drug abuse. Urine drug screen positive for cocaine.  Provide counseling  10. Lethargy: likely med related/ TBI. Med doses and schedule decreased  -urine culture with multispecies.       LOS (Days) 7 A FACE TO FACE EVALUATION WAS PERFORMED  Reagan Behlke T 12/12/2012, 9:36 AM

## 2012-12-12 NOTE — Progress Notes (Signed)
Physical Therapy Session Note  Patient Details  Name: [name] MRN: [id number] Date of Birth: [birth year]  Today's Date: 12/12/2012 Time: 0902-0958 Time Calculation (min): 56 min  Short Term Goals: Week 1:  PT Short Term Goal 1 (Week 1): Pt will perform sit <> stand from bed, wheelchair with min assist.  PT Short Term Goal 2 (Week 1): Pt will verbalize both weight bearing precautions and be able to maintain during mobility > 20% of session with <3 verbal cues.  PT Short Term Goal 3 (Week 1): Pt will ambulate 2' with PFRW and min assist while maintaining preacuations with max verbal cues.   Skilled Therapeutic Interventions/Progress Updates:    Wheelchair propulsion throughout session up to min assist, Rt. UE/ Lt. LE for propulsion cues needed to avoid use of Lt. UE. Ambulation with Lt. PFRW with min/mod x ~25' to/from goal to shoot ball. Verbal cues needed nearly every step for maintaining Rt. LE precautions, therapist placed foot under pt's Rt. Foot to ensure TDWB. Standing ball toss + cognitive distraction (naming food starting with certain letters) Lt. UE supported, cues for TDWB of Rt. LE, min assist for balance. 3 mobility activities listed out for pt, working on functional and short term memory, safety with mobility, maintaining precautions during functional task. Pt able to recall first task with min cues, second with mod cues, and last task with mod/max verbal cues. Simple pathfinding back to room, pt able to remember room number but difficulty using sign to determine which direction room was located. During session also addressed safety awareness, awareness of situation, and sustained attention. Pt in min to mod distracting environment during session.   Therapy Documentation Precautions:  Precautions Precautions: Fall Required Braces or Orthoses: Other Brace/Splint Other Brace/Splint: short arm  cast on Lt. UE, Rt. Bledsoe brace Restrictions Weight Bearing Restrictions:  Yes LUE Weight Bearing: Non weight bearing RLE Weight Bearing: Touchdown weight bearing Other Position/Activity Restrictions: No ROM to Rt. knee Pain: Pain Assessment Faces Pain Scale: Hurts little more Pain Type: Acute pain Pain Location: Leg Pain Orientation: Right Pain Descriptors / Indicators: Aching Pain Onset: On-going Pain Intervention(s): Repositioned (adjusted leg rest)  See FIM for current functional status  Therapy/Group: Individual Therapy  [name] 12/12/2012, 12:15 PM

## 2012-12-12 NOTE — Progress Notes (Signed)
Occupational Therapy Session Note  Patient Details  Name: [name] MRN: [id number] Date of Birth: July 08, 1965  Today's Date: 12/12/2012  Session 1 Time: 0700-0800 Time Calculation (min): 60 min  Short Term Goals: Week 1:  OT Short Term Goal 1 (Week 1): Pt will attend to a functional tasks with sustained attention for 4 min  OT Short Term Goal 2 (Week 1): Pt will gather his own clothing at w/c level with supervision OT Short Term Goal 3 (Week 1): Pt will be oriented x3 with min questioning cues OT Short Term Goal 4 (Week 1): Pt will perform basic toilet transfer with min A consistently OT Short Term Goal 5 (Week 1): Pt will recall left UE and right LE precautions with max cuing with functional tasks  Skilled Therapeutic Interventions/Progress Updates:    Pt resting in bed with sister at side.  Pt stated he was ready to wash up and change clothes.  Pt again requested to remove brace since the staples were removed.  Reminded patient of purpose of brace and MD orders to wear Bledsoe brace at all times.  Pt performed stand pivot transfer to w/c with supervision and max verbal cues to adhere to weight bearing precautions.  Pt completed all bathing and dressing tasks with sit<>stand from w/c at sink.  Pt required max verbal cues to thread pants into RLE before threading LLE.  Pt required verbal cues for weight bearing restrictions every time he stood at sink.  Pt rolled to day room to read newspaper.  Pt was unable to locate specific article as requested by therapist.  Pt exhibited difficulty sustaining attention while reading articles and would jump around on page before completing one article. Focus on awareness, orientation, safety awareness, dynamic standing balance, transfers, compensatory strategies, and sustained attention.  Therapy Documentation Precautions:  Precautions Precautions: Fall Required Braces or Orthoses: Other Brace/Splint Other Brace/Splint: short arm  cast on Lt. UE,  Rt. Bledsoe brace Restrictions Weight Bearing Restrictions: Yes LUE Weight Bearing: Non weight bearing RLE Weight Bearing: Touchdown weight bearing Other Position/Activity Restrictions: No ROM to Rt. knee Pain: Pain Assessment Faces Pain Scale: Hurts little more Pain Type: Acute pain Pain Location: Leg Pain Orientation: Right Pain Descriptors / Indicators: Aching Pain Onset: On-going Pain Intervention(s): RN made aware;Repositioned  See FIM for current functional status  Therapy/Group: Individual Therapy  Session 2 Time: 1100-1125 Pt denies pain Individual therapy  Pt resting in bed upon arrival and required verbal and tactile cues for arousal.  Pt required extra time to awake and sit EOB.  Pt requested to use urinal but was able to place and remove upon completion.  During transfer from bed to w/c pt required max verbal cues for WBing precautions but was able to complete squat pivot transfer with min A. Pt brushed teeth and washed hands at sink before rolling to day room to await next therapy. Pt remained in day room with rehab tech present and quick release belt in place.  Session 3 Time: 1400-1445 Pt denies pain Individual Therapy  Pt resting in bed upon arrival but easily aroused.  Pt on bed rest following central line removal but able to engage in bedside activities.  Pt required max verbal/questioning cues for orientation to place but was able to describe why he was in the hospital once oriented to hospital.  Discussion switched to professional sporting teams with emphasis on teams in [place].  By self admission patient stated he knew all the teams in [place] but  was unable to name any.  Engaged in word finding games but patient was unable to sustain attention longer than 15 seconds and was unsuccessful in finding any words.  [name] [facility] 12/12/2012, 8:06 AM

## 2012-12-12 NOTE — Progress Notes (Signed)
Speech Language Pathology Weekly Progress Note  Patient Details  Name: [name] MRN: [id number] Date of Birth: 08/03/1965  Today's Date: 12/12/2012 Time: 7829-5621 Time Calculation (min): 45 min  Short Term Goals: Week 1: SLP Short Term Goal 1 (Week 1): Pt will orient to time, place and situation with Max A multimodal cues.  SLP Short Term Goal 1 - Progress (Week 1): Met SLP Short Term Goal 2 (Week 1): Pt will demonstrate sustained attention to a functional task for 5 minutes with Max A verbal cues for redirection  SLP Short Term Goal 2 - Progress (Week 1): Met SLP Short Term Goal 3 (Week 1): Pt will recall safety precautions with Max A multimodal cueing SLP Short Term Goal 3 - Progress (Week 1): Not met SLP Short Term Goal 4 (Week 1): Pt will maintain topic of conversation for 2 turns with Max A mutlimodal cueing SLP Short Term Goal 4 - Progress (Week 1): Met  New Short Term Goals:  Week 2: SLP Short Term Goal 1 (Week 2): Pt will maintain topic of conversation for 3 turns with Mod A mutlimodal cueing SLP Short Term Goal 2 (Week 2): Pt will recall safety precautions with Max A multimodal cueing. SLP Short Term Goal 3 (Week 2): Pt will demonstrate selective attention to a functional task for 15 minutes with Mod A verbal cues for redirection  SLP Short Term Goal 4 (Week 2): Pt will orient to time, place and situation with Mod A multimodal cues.  SLP Short Term Goal 5 (Week 2): Pt will demonstrate functional problem solving for basic and familiar tasks with Mod A multimodal cueing.  SLP Short Term Goal 6 (Week 2): Pt will identify 1 physical and 1 cognitive deficit with Max A question and visual cues.   Weekly Progress Updates: Pt has made steady gains and has met 3 of 4 STG's this reporting period. Currently, pt is demonstrating behaviors consistent with a [place] Level V and requires max multimodal cueing for orientation, functional problem solving, intellectual awareness, safety  awareness, working memory and adherence to weight bearing precautions. Pt is also demonstrating increased sustained attention to functional tasks. Pt is consuming regular textures with thin liquids without overt s/s of aspiration and is overall Mod I for utilization of swallowing compensatory strategies. Pt/family education ongoing. Pt would benefit from continued skilled SLP intervention to maximize cognitive function and overall functional independence.    SLP Intensity: Minumum of 1-2 x/day, 30 to 90 minutes SLP Frequency: 5 out of 7 days SLP Duration/Estimated Length of Stay: 2 weeks SLP Treatment/Interventions: Cognitive remediation/compensation;Cueing hierarchy;Environmental controls;Internal/external aids;Functional tasks;Patient/family education;Therapeutic Activities  Daily Session Skilled Therapeutic Intervention: Treatment focus on dysphagia and cognitive goals. SLP facilitated session by providing extra time for tray set-up of breakfast meal. Pt consumed Dys. 3 textures with thin liquids without overt s/s of aspiration. Pt was independently oriented to place, situation and month and required Max question and visual cues to orient to day and date. Pt independently requested to use the bathroom and required Max A multimodal cueing for safety with transfer and for adherence to weight bearing precautions. Pt able to sustain attention to a functional conversation for ~4 turns with Max A multimodal cueing.  FIM:  Comprehension Comprehension Mode: Auditory Comprehension: 4-Understands basic 75 - 89% of the time/requires cueing 10 - 24% of the time Expression Expression Mode: Verbal Expression: 3-Expresses basic 50 - 74% of the time/requires cueing 25 - 50% of the time. Needs to repeat parts of sentences.  Social Interaction Social Interaction: 3-Interacts appropriately 50 - 74% of the time - May be physically or verbally inappropriate. Problem Solving Problem Solving: 2-Solves basic 25 - 49%  of the time - needs direction more than half the time to initiate, plan or complete simple activities Memory Memory: 2-Recognizes or recalls 25 - 49% of the time/requires cueing 51 - 75% of the time FIM - Eating Eating Activity: 6: Swallowing techniques: self-managed Pain Pain Assessment Pain Assessment: Pt reported pain in leg and back, unable to rate. Pt repositioned and RN notified and administered medications.   Therapy/Group: Individual Therapy  Verlon Pischke 12/12/2012, 3:51 PM

## 2012-12-12 NOTE — Progress Notes (Signed)
Orthopedic Tech Progress Note Patient Details:  [name] [birth year] [id number]  Casting Type of Cast: Short arm cast Cast Material: Fiberglass Cast Intervention: Application     Jennye Moccasin 12/12/2012, 7:51 PM

## 2012-12-12 NOTE — Progress Notes (Signed)
Occupational Therapy Weekly Progress Note  Patient Details  Name: [name] MRN: [id number] Date of Birth: [birth year]  Today's Date: 12/12/2012  Patient has met 3 of 5 short term goals.  Pt has made steady progress with BADLs this past week but has been inconsistent with adhering to LUE and RLE weight bearing precautions.  Pt requires max verbal and tactile cues to maintain weight bearing when engaged in functional activities.  Pt continually states that he could walk better if his Bledsoe brace could be removed.  Pt is able to sustain attention during BADLs for approx 10 mins.  Pt continues to require mod to max questioning cues for orientation and requires max verbal cues for recalling weight bearing precautions.  Pt requires max verbal cues to use compensatory strategies for lower body dressing tasks.   Patient continues to demonstrate the following deficits:muscle weakness, decreased cardiorespiratoy endurance, decreased visual motor skills, decreased attention, decreased awareness, decreased problem solving, decreased safety awareness, decreased memory and delayed processing and decreased standing balance, decreased balance strategies and difficulty maintaining precautions and therefore will continue to benefit from skilled OT intervention to enhance overall performance with BADL and iADL.  Patient progressing toward long term goals..  Continue plan of care.  OT Short Term Goals Week 1:  OT Short Term Goal 1 (Week 1): Pt will attend to a functional tasks with sustained attention for 4 min  OT Short Term Goal 1 - Progress (Week 1): Met OT Short Term Goal 2 (Week 1): Pt will gather his own clothing at w/c level with supervision OT Short Term Goal 2 - Progress (Week 1): Met OT Short Term Goal 3 (Week 1): Pt will be oriented x3 with min questioning cues OT Short Term Goal 3 - Progress (Week 1): Progressing toward goal OT Short Term Goal 4 (Week 1): Pt will perform basic toilet transfer  with min A consistently OT Short Term Goal 4 - Progress (Week 1): Progressing toward goal OT Short Term Goal 5 (Week 1): Pt will recall left UE and right LE precautions with max cuing with functional tasks OT Short Term Goal 5 - Progress (Week 1): Met Week 2:  OT Short Term Goal 1 (Week 2): Pt will be oriented x 3 with min questiong cues OT Short Term Goal 2 (Week 2): Pt will perform basic toilet transfers with min A consistently OT Short Term Goal 3 (Week 2): Pt will recall LUE and RLE precautions with mod cuing during functional tasks OT Short Term Goal 4 (Week 2): Pt will recall and demonstrate compensatory dressing strategies with mod cuing OT Short Term Goal 5 (Week 2): Pt will demonstrate emergent awareness during functional activities with mod cuing      Therapy Documentation Precautions:  Precautions Precautions: Fall Required Braces or Orthoses: Other Brace/Splint Other Brace/Splint: short arm  cast on Lt. UE, Rt. Bledsoe brace Restrictions Weight Bearing Restrictions: Yes LUE Weight Bearing: Non weight bearing RLE Weight Bearing: Touchdown weight bearing Other Position/Activity Restrictions: No ROM to Rt. knee Pain: Pain Assessment Faces Pain Scale: Hurts little more Pain Type: Acute pain Pain Location: Leg Pain Orientation: Right Pain Descriptors / Indicators: Aching Pain Onset: On-going Pain Intervention(s): RN made aware;Repositioned  See FIM for current functional status  Therapy/Group: Individual Therapy  Rich Brave 12/12/2012, 9:44 AM

## 2012-12-12 NOTE — Progress Notes (Signed)
Speech Language Pathology Daily Session Note  Patient Details  Name: [name] MRN: [id number] Date of Birth: 1966-03-08  Today's Date: 12/12/2012 Time: 4098-1191 Time Calculation (min): 10 min  Short Term Goals: Week 1: SLP Short Term Goal 1 (Week 1): Pt will orient to time, place and situation with Max A multimodal cues.  SLP Short Term Goal 2 (Week 1): Pt will demonstrate sustained attention to a functional task for 5 minutes with Max A verbal cues for redirection  SLP Short Term Goal 3 (Week 1): Pt will recall safety precautions with Max A multimodal cueing SLP Short Term Goal 4 (Week 1): Pt will maintain topic of conversation for 2 turns with Max A mutlimodal cueing  Skilled Therapeutic Interventions: Skilled co-treatment session with OT to address cognition and dysphagia goals.  SLP facilitated session with increased wait time for patient to problem solve and demonstrate efficient mastication of regular textures.  Patient consumed regular textures and thin liquids without overt s/s of aspiration.  Continue with current plan of care and intermittent staff supervision.      FIM:  Comprehension Comprehension Mode: Auditory Comprehension: 4-Understands basic 75 - 89% of the time/requires cueing 10 - 24% of the time Expression Expression Mode: Verbal Expression: 3-Expresses basic 50 - 74% of the time/requires cueing 25 - 50% of the time. Needs to repeat parts of sentences. Social Interaction Social Interaction: 3-Interacts appropriately 50 - 74% of the time - May be physically or verbally inappropriate. Problem Solving Problem Solving: 2-Solves basic 25 - 49% of the time - needs direction more than half the time to initiate, plan or complete simple activities Memory Memory: 2-Recognizes or recalls 25 - 49% of the time/requires cueing 51 - 75% of the time FIM - Eating Eating Activity: 6: Swallowing techniques: self-managed  Pain Pain Assessment Pain Assessment: No/denies  pain Faces Pain Scale: Hurts little more Pain Type: Acute pain Pain Location: Leg Pain Orientation: Right Pain Descriptors / Indicators: Aching Pain Onset: On-going Pain Intervention(s): Repositioned (adjusted leg rest)  Therapy/Group: Group Therapy  Fae Pippin, M.A., CCC-SLP [phone]  Lavern Crimi 12/12/2012, 1:25 PM

## 2012-12-13 ENCOUNTER — Inpatient Hospital Stay (HOSPITAL_COMMUNITY): Payer: No Typology Code available for payment source | Admitting: Occupational Therapy

## 2012-12-13 ENCOUNTER — Inpatient Hospital Stay (HOSPITAL_COMMUNITY): Payer: No Typology Code available for payment source | Admitting: Physical Therapy

## 2012-12-13 ENCOUNTER — Inpatient Hospital Stay (HOSPITAL_COMMUNITY): Payer: No Typology Code available for payment source | Admitting: Speech Pathology

## 2012-12-13 ENCOUNTER — Inpatient Hospital Stay (HOSPITAL_COMMUNITY): Payer: No Typology Code available for payment source

## 2012-12-13 DIAGNOSIS — D62 Acute posthemorrhagic anemia: Secondary | ICD-10-CM

## 2012-12-13 DIAGNOSIS — I6340 Cerebral infarction due to embolism of unspecified cerebral artery: Secondary | ICD-10-CM

## 2012-12-13 DIAGNOSIS — J9600 Acute respiratory failure, unspecified whether with hypoxia or hypercapnia: Secondary | ICD-10-CM

## 2012-12-13 NOTE — Progress Notes (Signed)
Patient ID: [name], male   DOB: 09-28-1965, 47 y.o.   MRN: [id number] Subjective/Complaints: Right leg feels "numb". Pain is better. Cast changed.    Review of Systems      Objective: Vital Signs: Blood pressure 99/67, pulse 118, temperature 98.3 F (36.8 C), temperature source Oral, resp. rate 20, height 5\' 7"  (1.702 m), weight 79 kg (174 lb 2.6 oz), SpO2 99.00%. No results found. Results for orders placed during the hospital encounter of 12/05/12 (from the past 72 hour(s))  URINALYSIS, ROUTINE W REFLEX MICROSCOPIC     Status: Abnormal   Collection Time    12/10/12  5:24 PM      Result Value Range   Color, Urine AMBER (*) YELLOW   Comment: BIOCHEMICALS MAY BE AFFECTED BY COLOR   APPearance CLEAR  CLEAR   Specific Gravity, Urine 1.017  1.005 - 1.030   pH 6.5  5.0 - 8.0   Glucose, UA NEGATIVE  NEGATIVE mg/dL   Hgb urine dipstick NEGATIVE  NEGATIVE   Bilirubin Urine NEGATIVE  NEGATIVE   Ketones, ur NEGATIVE  NEGATIVE mg/dL   Protein, ur NEGATIVE  NEGATIVE mg/dL   Urobilinogen, UA 1.0  0.0 - 1.0 mg/dL   Nitrite NEGATIVE  NEGATIVE   Leukocytes, UA NEGATIVE  NEGATIVE   Comment: MICROSCOPIC NOT DONE ON URINES WITH NEGATIVE PROTEIN, BLOOD, LEUKOCYTES, NITRITE, OR GLUCOSE <1000 mg/dL.  URINE CULTURE     Status: None   Collection Time    12/10/12  5:24 PM      Result Value Range   Specimen Description URINE, CLEAN CATCH     Special Requests NONE     Culture  Setup Time 12/10/2012 18:58     Colony Count >=100,000 COLONIES/ML     Culture       Value: Multiple bacterial morphotypes present, none predominant. Suggest appropriate recollection if clinically indicated.   Report Status 12/12/2012 FINAL    CREATININE, SERUM     Status: None   Collection Time    12/12/12  6:10 AM      Result Value Range   Creatinine, Ser 0.95  0.50 - 1.35 mg/dL   GFR calc non Af Amer >90  >90 mL/min   GFR calc Af Amer >90  >90 mL/min   Comment:            The eGFR has been calculated      using the CKD EPI equation.     This calculation has not been     validated in all clinical     situations.     eGFR's persistently     <90 mL/min signify     possible Chronic Kidney Disease.     Vitals reviewed.  HENT:  Head: Normocephalic.  Eyes:  Pupils reactive to light.  Neck: Normal range of motion. Neck supple. No thyromegaly present.  Cardiovascular: Normal rate and regular rhythm.  Pulmonary/Chest: Effort normal and breath sounds normal. No respiratory distress.  Abdominal: Soft. Bowel sounds are normal. He exhibits no distension.  Musculoskeletal:  Left thumb spica splint/cast in place  Neurological:   . He needed multiple cues for place and situation. .  motor strength limited exam secondary to left thumb spica cast and right thumb spica splint and right knee immobilizer.  5/5 bilateral deltoid bicep triceps and left hip flexor knee extensor ankle dorsiflexor plantar flexor  Right ankle dorsiflexor plantar flexor 3/5?Marland Kitchen Sensation intact to light touch in left lower extremities, right lower seems decreased in  relation to light touch. intact upper extremities difficult to test secondary to his splints  Patient is distractible. Needs redirection. Speech dysarthric Skin: clean and intact right thigh wound. Staples/sutures in place  Assessment/Plan: 1. 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 Physiatrist is providing close team supervision and 24 hour management of active medical problems listed below. Physiatrist and rehab team continue to assess barriers to discharge/monitor patient progress toward functional and medical goals.  Spent time educating pt,sister regarding his injury,  Nerve related problems, cognitive deficits FIM: FIM - Bathing Bathing Steps Patient Completed: Chest;Right Arm;Left  Arm;Abdomen;Front perineal area;Buttocks;Right upper leg;Left upper leg;Left lower leg (including foot) Bathing: 4: Min-Patient completes 8-9 47f 10 parts or 75+ percent  FIM - Upper Body Dressing/Undressing Upper body dressing/undressing steps patient completed: Thread/unthread right sleeve of pullover shirt/dresss;Thread/unthread left sleeve of pullover shirt/dress;Put head through opening of pull over shirt/dress;Pull shirt over trunk Upper body dressing/undressing: 5: Supervision: Safety issues/verbal cues FIM - Lower Body Dressing/Undressing Lower body dressing/undressing steps patient completed: Thread/unthread left underwear leg;Pull underwear up/down;Thread/unthread left pants leg;Pull pants up/down;Don/Doff left sock;Don/Doff left shoe;Thread/unthread right pants leg Lower body dressing/undressing: 4: Min-Patient completed 75 plus % of tasks  FIM - Toileting Toileting steps completed by patient: Adjust clothing prior to toileting;Performs perineal hygiene;Adjust clothing after toileting Toileting: 5: Set-up assist to: Obtain supplies  FIM - Diplomatic Services operational officer Devices: Grab bars Toilet Transfers: 4-From toilet/BSC: Min A (steadying Pt. > 75%)  FIM - Bed/Chair Transfer Bed/Chair Transfer Assistive Devices: Arm rests Bed/Chair Transfer: 3: Chair or W/C > Bed: Mod A (lift or lower assist)  FIM - Locomotion: Wheelchair Distance: 150 Locomotion: Wheelchair: 4: Travels 150 ft or more: maneuvers on rugs and over door sillls with minimal assistance (Pt.>75%) FIM - Locomotion: Ambulation Locomotion: Ambulation Assistive Devices: TEFL teacher Ambulation/Gait Assistance: 3: Mod assist Locomotion: Ambulation: 1: Travels less than 50 ft with moderate assistance (Pt: 50 - 74%)  Comprehension Comprehension Mode: Auditory Comprehension: 4-Understands basic 75 - 89% of the time/requires cueing 10 - 24% of the time  Expression Expression Mode: Verbal Expression:  3-Expresses basic 50 - 74% of the time/requires cueing 25 - 50% of the time. Needs to repeat parts of sentences.  Social Interaction Social Interaction: 3-Interacts appropriately 50 - 74% of the time - May be physically or verbally inappropriate.  Problem Solving Problem Solving: 2-Solves basic 25 - 49% of the time - needs direction more than half the time to initiate, plan or complete simple activities  Memory Memory: 2-Recognizes or recalls 25 - 49% of the time/requires cueing 51 - 75% of the time Medical Problem List and Plan:  1. Traumatic brain injury/ multitrauma  2. DVT Prophylaxis/Anticoagulation: Subcutaneous Lovenox. Monitor platelet counts any signs of bleeding  3. Pain Management: Ultram decreased to 50 mg every 6 hours (sedation) and oxycodone as needed for breakthrough pain. Tolerating therapy activities at present. 4. Neuropsych: This patient is not capable of making decisions on his own behalf.  5. Multiple facial fractures. Status post ORIF of right tripod fracture 12/04/2012    6. Right femur and acetabular as well as patella fracture. Status post ORIF right transverse anterior column with removal of broken hardware and intramedullary nail right femur 11/29/2012. Patient is touchdown weightbearing   -dc remaining staples/sutures 7. Open fracture dislocation, left thumb MCP joint status post ORIF and pinning of  fracture with ulnar collateral ligament repair reconstruction of left from 11/29/2012. Patient is nonweightbearing --sutures, cast changed yesterday 8. Acute blood loss anemia. Latest hemoglobin up to 8.7 9. History of alcohol and drug abuse. Urine drug screen positive for cocaine.  Provide counseling  10. Lethargy: likely med related/ TBI. Med doses and schedule decreased  -urine culture with multispecies.   -much improved overall      LOS (Days) 8 A FACE TO FACE EVALUATION WAS PERFORMED  SWARTZ,ZACHARY T 12/13/2012, 9:31 AM

## 2012-12-13 NOTE — Progress Notes (Signed)
  Subjective: Pt reports minimal right facial pain. Continues to make progress with rehab program.   Objective: Vital signs in last 24 hours: Temp:  [98.1 F (36.7 C)-98.3 F (36.8 C)] 98.1 F (36.7 C) (07/18 1451) Pulse Rate:  [67-118] 69 (07/18 1451) Resp:  [19-20] 19 (07/18 1451) BP: (99-116)/(65-72) 116/70 mmHg (07/18 1451) SpO2:  [99 %-100 %] 100 % (07/18 1451) Last BM Date: 12/12/12  Intake/Output from previous day: 07/17 0701 - 07/18 0700 In: 480 [P.O.:480] Out: 1600 [Urine:1600] Intake/Output this shift:    Exam: Pt is alert and cooperative.  Sutures were removed from the right eyelid and ear .  Lab Results:  No results found for this basename: WBC, HGB, HCT, PLT,  in the last 72 hours BMET  Recent Labs  12/12/12 0610  CREATININE 0.95   PT/INR No results found for this basename: LABPROT, INR,  in the last 72 hours ABG No results found for this basename: PHART, PCO2, PO2, HCO3,  in the last 72 hours  Studies/Results: No results found.  Anti-infectives: Anti-infectives   None      Assessment/Plan: s/p * No surgery found * S/P ORIF facial fractures- Doing well. Will need to follow up in the office following discharge with Dr. Kelly Splinter [phone]  LOS: 8 days    Franki Monte 12/13/2012 Plastic Surgery [phone]

## 2012-12-13 NOTE — Progress Notes (Signed)
Speech Language Pathology Daily Session Notes  Patient Details  Name: [name] MRN: [id number] Date of Birth: 1965-10-24  Today's Date: 12/13/2012  Session 1 Time: 2956-2130 Time Calculation (min): 45 min  Session 2 Time: 1400-1440 Time Calculation: 40 min  Short Term Goals: Week 2: SLP Short Term Goal 1 (Week 2): Pt will maintain topic of conversation for 3 turns with Mod A mutlimodal cueing SLP Short Term Goal 2 (Week 2): Pt will recall safety precautions with Max A multimodal cueing. SLP Short Term Goal 3 (Week 2): Pt will demonstrate selective attention to a functional task for 15 minutes with Mod A verbal cues for redirection  SLP Short Term Goal 4 (Week 2): Pt will orient to time, place and situation with Mod A multimodal cues.  SLP Short Term Goal 5 (Week 2): Pt will demonstrate functional problem solving for basic and familiar tasks with Mod A multimodal cueing.  SLP Short Term Goal 6 (Week 2): Pt will identify 1 physical and 1 cognitive deficit with Max A question and visual cues.   Skilled Therapeutic Interventions:  Session 1: Treatment focus on dysphagia and cognitive goals. SLP facilitated session by providing extra time and supervision question cues for functional problem solving with tray set-up. Pt consumed regular textures with thin liquids without overt s/s of aspiration. Pt also participated in a mildly complex new learning task and required Min question and visual cues for functional problem solving and organization with task. Pt independently requested to use the bathroom and required total A for adherence to weight bearing precautions.    Session 2: Treatment focus on cognitive goals. Pt required Mod question cues to recall rules of previously learned task from the morning session. Pt required Mod question and visual cues for functional problem solving and organization with task. Pt also taught clinician new card game ("tonk") and required Mod question cues for  explanation of organization and rules of the game. Pt transferred back to bed at end of session and required total A for adherence to weight bearing precautions.   FIM:  Comprehension Comprehension Mode: Auditory Comprehension: 4-Understands basic 75 - 89% of the time/requires cueing 10 - 24% of the time Expression Expression Mode: Verbal Expression: 4-Expresses basic 75 - 89% of the time/requires cueing 10 - 24% of the time. Needs helper to occlude trach/needs to repeat words. Social Interaction Social Interaction: 4-Interacts appropriately 75 - 89% of the time - Needs redirection for appropriate language or to initiate interaction. Problem Solving Problem Solving: 3-Solves basic 50 - 74% of the time/requires cueing 25 - 49% of the time Memory Memory: 2-Recognizes or recalls 25 - 49% of the time/requires cueing 51 - 75% of the time  Pain Pain Assessment Pain Assessment: No/denies pain  Therapy/Group: Individual Therapy  Eura Mccauslin 12/13/2012, 3:23 PM

## 2012-12-13 NOTE — Progress Notes (Signed)
Physical Therapy Weekly Progress Note  Patient Details  Name: [name] MRN: [id number] Date of Birth: [birth year]  Today's Date: 12/13/2012 Time:9:30-10:15  Time Calculation (min): 45 min  Patient has met 2 of 3 short term goals.  Pt continues to demonstrate difficulty recalling both weight bearing precautions as well as implementing them during all functional mobility.   Patient continues to demonstrate the following deficits: balance, functional endurance, coordination, ability to recall and maintain precautions during functional mobility and therefore will continue to benefit from skilled PT intervention to enhance overall performance with activity tolerance, balance, ability to compensate for deficits, attention, awareness, coordination and knowledge of precautions.  Patient progressing toward long term goals..  Continue plan of care.  PT Short Term Goals Week 2:  PT Short Term Goal 1 (Week 2): Pt will perform t/f sit<>stand from both bed and w/c w/ SBA. PT Short Term Goal 2 (Week 2): Pt will verbalize both weight bearing precautions and be able to maintain during >20% of session w/ <3 verbal cues.  PT Short Term Goal 3 (Week 2): Pt will amb x100' w/ PFRW and CGA assist while maintaining precautions w/ mod verbal cues.  Skilled Therapeutic Interventions/Progress Updates:  Ambulation/gait training;Balance/vestibular training;Cognitive remediation/compensation;Community reintegration;Discharge planning;Disease management/prevention;Patient/family education;DME/adaptive equipment instruction;Functional mobility training;Neuromuscular re-education;Pain management;Therapeutic Activities;Therapeutic Exercise;Psychosocial support;Skin care/wound management;Splinting/orthotics;Stair training;Wheelchair propulsion/positioning;UE/LE Strength taining/ROM;UE/LE Coordination activities   Therapy Documentation Precautions:  Precautions Precautions: Fall Required Braces or Orthoses: Other  Brace/Splint Other Brace/Splint: short arm  cast on Lt. UE, Rt. Bledsoe brace Restrictions Weight Bearing Restrictions: Yes LUE Weight Bearing: Non weight bearing RLE Weight Bearing: Touchdown weight bearing Other Position/Activity Restrictions: No ROM to Rt. knee    Pain: Pain Assessment Pain Assessment: No/denies pain  Skilled therapeutic Interventions  Pt found semi-reclined in bed and asleep at start of tx session. Pt req mod v/c +t/c to wake and req increased time to orient to PT and that it was time for therapy. Pt req min A for R LE during t/f sup>sit EOB and mod A during MSPT bed<>w/c for stability and ability to maintain R LE WB status. Pt continues to req CGA-min A during w/c propulsion, >150' w/ use of R UE/L LE, as he easily becomes distracted in busy environments such as the hallway and req mod v/c for re-direction to task. Increased focus on amb and curb step negotiation during this tx session. Pt able to t/f sit<>stand w/ L PFRW and min A, however, pt continues to remain unable to maintain TDWB status of R LE during both static standing and amb. Utilized theraband to support R LE attached to RW to maintain complete NWB status for safety. Pt able to amb 11' and then 36' w/ min A for balance and mod v/c for hop size. Pt also able to negotiate up/down 4" single curb step x2 w/ use of L PFRW and mod A x2. Pt ascended step backwards and descended step forwards.   Second Session Time:  11:00-11:25 Time Calculation (min): Pain: No/denies pain Skilled Therapeutic Interventions/Progress Updates:  This treatment session focused on pt's ability to dual task a cognitive and physical challenge. Pt played 2 games of checker while standing w/ L PFRW and PT at side. Pt able to maintain standing w/ use of L PFRW, min guard and utilize R UE to play game. Pt continues to demonstrate difficulty maintaining and understanding TDWB status, so benefits from complete NWB of R LE to ensure  precautions. R LE supported by theraband attached to  RW to maintain NWB status. Pt actively engaged in game of checkers throughout tx session, able to recall rules of game and also able to adapt to changed rules of game.    See FIM for current functional status  Therapy/Group: Individual Therapy  Denzil Hughes 12/13/2012, 11:55 AM

## 2012-12-13 NOTE — Progress Notes (Signed)
Occupational Therapy Session Note  Patient Details  Name: [name] MRN: [id number] Date of Birth: June 20, 1965  Today's Date: 12/13/2012 Time: 4782-9562 Time Calculation (min): 45 min  Short Term Goals: Week 2:  OT Short Term Goal 1 (Week 2): Pt will be oriented x 3 with min questiong cues OT Short Term Goal 2 (Week 2): Pt will perform basic toilet transfers with min A consistently OT Short Term Goal 3 (Week 2): Pt will recall LUE and RLE precautions with mod cuing during functional tasks OT Short Term Goal 4 (Week 2): Pt will recall and demonstrate compensatory dressing strategies with mod cuing OT Short Term Goal 5 (Week 2): Pt will demonstrate emergent awareness during functional activities with mod cuing  Skilled Therapeutic Interventions/Progress Updates:  Patient sleeping in bed upon arrival.  Engaged in bed mobility, stand pivot transfers bed>w/c>toilet to stand to urinate.  Patient propelled w/c to dayroom to engage in standing tolerance and standing balance while completing a cognitive table top task.  Patient able to recall learning card game from 3 days ago and only needed minimal cues to play the game after review.  While reviewing the game, patient able to recall steps/rules of the game.  Patient back in his room with QRB in place and eating late lunch.  Therapy Documentation Precautions:  Precautions Precautions: Fall Required Braces or Orthoses: Other Brace/Splint Other Brace/Splint: short arm  cast on Lt. UE, Rt. Bledsoe brace Restrictions Weight Bearing Restrictions: Yes LUE Weight Bearing: Non weight bearing RLE Weight Bearing: Touchdown weight bearing Other Position/Activity Restrictions: No ROM to Rt. knee Pain: Pain Assessment Pain Assessment: No/denies pain  Therapy/Group: Individual Therapy  Khristi Schiller 12/13/2012, 4:26 PM

## 2012-12-13 NOTE — Progress Notes (Signed)
Occupational Therapy Session Note  Patient Details  Name: [name] MRN: [id number] Date of Birth: [birth year]  Today's Date: 12/13/2012 Time: 0700-0755 Time Calculation (min): 55 min  Short Term Goals: Week 2:  OT Short Term Goal 1 (Week 2): Pt will be oriented x 3 with min questiong cues OT Short Term Goal 2 (Week 2): Pt will perform basic toilet transfers with min A consistently OT Short Term Goal 3 (Week 2): Pt will recall LUE and RLE precautions with mod cuing during functional tasks OT Short Term Goal 4 (Week 2): Pt will recall and demonstrate compensatory dressing strategies with mod cuing OT Short Term Goal 5 (Week 2): Pt will demonstrate emergent awareness during functional activities with mod cuing  Skilled Therapeutic Interventions/Progress Updates:    Pt resting in bed upon arrival with MD at side.  Pt engaged in ADL retraining including bathing and dressing with sit<>stand from w/c at sink.  Pt bathed right upper leg in bed after Bledsoe brace removed.  Pt performed squat pivot transfer to w/c with min A and verbal cues to adhere to weight bearing precautions. Pt issued reacher to assist threading pants onto RLE but continues to require verbal cues to thread right leg prior to threading left leg.  Will continue to encourage use of reacher for LB dressing.  Pt sustained attention for approx 20 mins during BADLs with supervision.  Pt rolled to threrapy gym to engage in game of Blink with emphasis on sustained attention in new task.  Pt readily understood rules of modified game of searching for identical object on nine cards.  Pt sustained attention for approx 10 mins.  Pt rolled to day room for breakfast with Speech therapist.  Focus on safety awareness, standing balance, AE use, attention, orientation, and activity tolerance.  Therapy Documentation Precautions:  Precautions Precautions: Fall Required Braces or Orthoses: Other Brace/Splint Other Brace/Splint: short arm  cast  on Lt. UE, Rt. Bledsoe brace Restrictions Weight Bearing Restrictions: Yes LUE Weight Bearing: Non weight bearing RLE Weight Bearing: Touchdown weight bearing Other Position/Activity Restrictions: No ROM to Rt. knee  Pain: Pain Assessment Pain Assessment: No/denies pain   See FIM for current functional status  Therapy/Group: Individual Therapy  Rich Brave 12/13/2012, 8:02 AM

## 2012-12-13 NOTE — Progress Notes (Signed)
Physical Therapy Weekly Progress Note  Patient Details  Name: [name] MRN: [id number] Date of Birth: 06-Apr-1966  Patient Details  Name: [name] MRN: [id number] Date of Birth: 1966/02/09  Today's Date: 12/13/2012 Time:9:30-10:15  Time Calculation (min): 45 min  Patient has met 2 of 3 short term goals.  Pt continues to demonstrate difficulty recalling both weight bearing precautions as well as implementing them during all functional mobility.   Patient continues to demonstrate the following deficits: balance, functional endurance, coordination, ability to recall and maintain precautions during functional mobility and therefore will continue to benefit from skilled PT intervention to enhance overall performance with activity tolerance, balance, ability to compensate for deficits, attention, awareness, coordination and knowledge of precautions.  Patient progressing toward long term goals..  Continue plan of care.  PT Short Term Goals Week 1:  PT Short Term Goal 1 (Week 1): Pt will perform sit <> stand from bed, wheelchair with min assist.  PT Short Term Goal 1 - Progress (Week 1): Met PT Short Term Goal 2 (Week 1): Pt will verbalize both weight bearing precautions and be able to maintain during mobility > 20% of session with <3 verbal cues.  PT Short Term Goal 2 - Progress (Week 1): Progressing toward goal PT Short Term Goal 3 (Week 1): Pt will ambulate 15' with PFRW and min assist while maintaining preacuations with max verbal cues.  PT Short Term Goal 3 - Progress (Week 1): Met Week 2:  PT Short Term Goal 1 (Week 2): Pt will perform t/f sit<>stand from both bed and w/c w/ SBA. PT Short Term Goal 2 (Week 2): Pt will verbalize both weight bearing precautions and be able to maintain during >20% of session w/ <3 verbal cues.  PT Short Term Goal 2 - Progress (Week 2): Not met PT Short Term Goal 3 (Week 2): Pt will amb x100' w/ PFRW and CGA assist while maintaining  precautions w/ mod verbal cues.  Skilled Therapeutic Interventions/Progress Updates:  Ambulation/gait training;Balance/vestibular training;Cognitive remediation/compensation;Community reintegration;Discharge planning;Disease management/prevention;Patient/family education;DME/adaptive equipment instruction;Functional mobility training;Neuromuscular re-education;Pain management;Therapeutic Activities;Therapeutic Exercise;Psychosocial support;Skin care/wound management;Splinting/orthotics;Stair training;Wheelchair propulsion/positioning;UE/LE Strength taining/ROM;UE/LE Coordination activities   Pt found semi-reclined in bed and asleep at start of tx session. Pt req mod v/c +t/c to wake and req increased time to orient to PT and that it was time for therapy. Pt req min A for R LE during t/f sup>sit EOB and mod A during MSPT bed<>w/c for stability and ability to maintain R LE WB status. Pt continues to req CGA-min A during w/c propulsion, >150' w/ use of R UE/L LE, as he easily becomes distracted in busy environments such as the hallway and req mod v/c for re-direction to task. Increased focus on amb and curb step negotiation during this tx session. Pt able to t/f sit<>stand w/ L PFRW and min A, however, pt continues to remain unable to maintain TDWB status of R LE during both static standing and amb. Utilized theraband to support R LE attached to RW to maintain complete NWB status for safety. Pt able to amb 5' and then 36' w/ min A for balance and mod v/c for hop size. Pt also able to negotiate up/down 4" single curb step x2 w/ use of L PFRW and mod A x2 persons. Pt ascended step backwards and descended step forwards.   Therapy Documentation Precautions:  Precautions Precautions: Fall Required Braces or Orthoses: Other Brace/Splint Other Brace/Splint: short arm  cast on Lt. UE, Rt. Bledsoe brace  Restrictions Weight Bearing Restrictions: Yes LUE Weight Bearing: Non weight bearing RLE Weight Bearing:  Touchdown weight bearing Other Position/Activity Restrictions: No ROM to Rt. knee    Pain: Pain Assessment Pain Assessment: No/denies pain  Second Session Time:  11:00-11:25 Time Calculation (min): Pain: No/denies pain Skilled Therapeutic Interventions/Progress Updates:  This treatment session focused on pt's ability to dual task while simultaneously performing a cognitive and physical challenge. Pt played 2 games of checker while standing w/ L PFRW andPT at side. Pt able to maintain standing w/ use of L PFRW,CGA and utilize R UE to play game. Pt continues to demonstrate difficulty maintaining and understanding TDWB status, so benefits from complete NWB of R LE to ensure precautions. R LE supported by theraband attached to RW to maintain NWB status. Pt actively engaged in game of checkers throughout tx session, able to recall rules of game and also able to adapt to changed rules of game.   See FIM for current functional status  Therapy/Group: Individual Therapy  [name] 12/13/2012, 12:52 PM

## 2012-12-14 ENCOUNTER — Inpatient Hospital Stay (HOSPITAL_COMMUNITY): Payer: No Typology Code available for payment source | Admitting: Speech Pathology

## 2012-12-14 ENCOUNTER — Inpatient Hospital Stay (HOSPITAL_COMMUNITY): Payer: No Typology Code available for payment source | Admitting: Physical Therapy

## 2012-12-14 NOTE — Progress Notes (Signed)
Stitches removed; Pt tolerated well; no c/o pain. Will continue to monitor pt

## 2012-12-14 NOTE — Progress Notes (Signed)
Patient ID: [name], male   DOB: 1965-11-28, 47 y.o.   MRN: [id number] Subjective/Complaints: No complaints today. Slept well. .    Review of Systems otherwise negative     Objective: Vital Signs: Blood pressure 95/60, pulse 71, temperature 98 F (36.7 C), temperature source Oral, resp. rate 18, height 5\' 7"  (1.702 m), weight 79 kg (174 lb 2.6 oz), SpO2 99.00%. No results found. Results for orders placed during the hospital encounter of 12/05/12 (from the past 72 hour(s))  CREATININE, SERUM     Status: None   Collection Time    12/12/12  6:10 AM      Result Value Range   Creatinine, Ser 0.95  0.50 - 1.35 mg/dL   GFR calc non Af Amer >90  >90 mL/min   GFR calc Af Amer >90  >90 mL/min   Comment:            The eGFR has been calculated     using the CKD EPI equation.     This calculation has not been     validated in all clinical     situations.     eGFR's persistently     <90 mL/min signify     possible Chronic Kidney Disease.     Vitals reviewed.  HENT:  Head: Normocephalic.  Eyes:  Pupils reactive to light.  Neck: Normal range of motion. Neck supple. No thyromegaly present.  Cardiovascular: Normal rate and regular rhythm.  Pulmonary/Chest: Effort normal and breath sounds normal. No respiratory distress.  Abdominal: Soft. Bowel sounds are normal. He exhibits no distension.  Musculoskeletal:  Left thumb spica splint/cast in place  Neurological:   . He needed multiple cues for place and situation. .  motor strength limited exam secondary to left thumb spica cast and right thumb spica splint and right knee immobilizer.  5/5 bilateral deltoid bicep triceps and left hip flexor knee extensor ankle dorsiflexor plantar flexor  Right ankle dorsiflexor plantar flexor 3/5?Marland Kitchen Sensation intact to light touch in left lower extremities, right lower seems decreased in relation to light touch. intact upper extremities difficult to test secondary to his splints  Patient is  distractible. Needs redirection. Speech dysarthric Skin: clean and intact right thigh wound. Staples/sutures in place  Assessment/Plan: 1. 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 Physiatrist is providing close team supervision and 24 hour management of active medical problems listed below. Physiatrist and rehab team continue to assess barriers to discharge/monitor patient progress toward functional and medical goals.  Spent time educating pt,sister regarding his injury,  Nerve related problems, cognitive deficits FIM: FIM - Bathing Bathing Steps Patient Completed: Chest;Right Arm;Left Arm;Abdomen;Front perineal area;Buttocks;Right upper leg;Left upper leg;Left lower leg (including foot) Bathing: 4: Min-Patient completes 8-9 9f 10 parts or 75+ percent  FIM - Upper Body Dressing/Undressing Upper body dressing/undressing steps patient completed: Thread/unthread right sleeve of pullover shirt/dresss;Thread/unthread left sleeve of pullover shirt/dress;Put head through opening of pull over shirt/dress;Pull shirt over trunk Upper body dressing/undressing: 5: Supervision: Safety issues/verbal cues FIM - Lower Body Dressing/Undressing Lower body dressing/undressing steps patient completed: Thread/unthread left underwear leg;Pull underwear up/down;Thread/unthread left pants leg;Pull pants up/down;Don/Doff left sock;Don/Doff left shoe;Thread/unthread right pants leg Lower body dressing/undressing: 4: Min-Patient completed 75 plus % of tasks  FIM - Toileting Toileting steps completed by patient: Adjust clothing prior to toileting;Performs perineal hygiene;Adjust clothing after toileting Toileting: 5:  Set-up assist to: Obtain supplies  FIM - Diplomatic Services operational officer Devices: Grab bars Toilet Transfers: 4-From  toilet/BSC: Min A (steadying Pt. > 75%)  FIM - Bed/Chair Transfer Bed/Chair Transfer Assistive Devices: Arm rests Bed/Chair Transfer: 4: Supine > Sit: Min A (steadying Pt. > 75%/lift 1 leg);3: Bed > Chair or W/C: Mod A (lift or lower assist)  FIM - Locomotion: Wheelchair Distance: 150 Locomotion: Wheelchair: 4: Travels 150 ft or more: maneuvers on rugs and over door sillls with minimal assistance (Pt.>75%) FIM - Locomotion: Ambulation Locomotion: Ambulation Assistive Devices: TEFL teacher Ambulation/Gait Assistance: 4: Min assist Locomotion: Ambulation: 1: Travels less than 50 ft with minimal assistance (Pt.>75%)  Comprehension Comprehension Mode: Auditory Comprehension: 4-Understands basic 75 - 89% of the time/requires cueing 10 - 24% of the time  Expression Expression Mode: Verbal Expression: 4-Expresses basic 75 - 89% of the time/requires cueing 10 - 24% of the time. Needs helper to occlude trach/needs to repeat words.  Social Interaction Social Interaction: 4-Interacts appropriately 75 - 89% of the time - Needs redirection for appropriate language or to initiate interaction.  Problem Solving Problem Solving: 3-Solves basic 50 - 74% of the time/requires cueing 25 - 49% of the time  Memory Memory: 2-Recognizes or recalls 25 - 49% of the time/requires cueing 51 - 75% of the time Medical Problem List and Plan:  1. Traumatic brain injury/ multitrauma  2. DVT Prophylaxis/Anticoagulation: Subcutaneous Lovenox. Monitor platelet counts any signs of bleeding  3. Pain Management: Ultram decreased to 50 mg every 6 hours (sedation) and oxycodone as needed for breakthrough pain. Tolerating therapy activities at present. 4. Neuropsych: This patient is not capable of making decisions on his own behalf.  5. Multiple facial fractures. Status post ORIF of right tripod fracture 12/04/2012    6. Right femur and acetabular as well as patella fracture. Status post ORIF right transverse  anterior column with removal of broken hardware and intramedullary nail right femur 11/29/2012. Patient is touchdown weightbearing   -dc remaining staples/sutures 7. Open fracture dislocation, left thumb MCP joint status post ORIF and pinning of fracture with ulnar collateral ligament repair reconstruction of left from 11/29/2012. Patient is nonweightbearing --sutures, cast changed thursday 8. Acute blood loss anemia. Latest hemoglobin up to 8.7 9. History of alcohol and drug abuse. Urine drug screen positive for cocaine.  Provide counseling  10. Lethargy: likely med related/ TBI. Med doses and schedule decreased  -urine culture with multispecies.   -much improved overall      LOS (Days) 9 A FACE TO FACE EVALUATION WAS PERFORMED  Zaharah Amir T 12/14/2012, 8:51 AM

## 2012-12-14 NOTE — Progress Notes (Signed)
Speech Language Pathology Daily Session Note  Patient Details  Name: LORENSO Owens MRN: [id number] Date of Birth: April 09, 1966  Today's Date: 12/14/2012 Time: 1430-1510 Time Calculation (min): 40 min  Short Term Goals: Week 2: SLP Short Term Goal 1 (Week 2): Pt will maintain topic of conversation for 3 turns with Mod A mutlimodal cueing SLP Short Term Goal 2 (Week 2): Pt will recall safety precautions with Max A multimodal cueing. SLP Short Term Goal 3 (Week 2): Pt will demonstrate selective attention to a functional task for 15 minutes with Mod A verbal cues for redirection  SLP Short Term Goal 4 (Week 2): Pt will orient to time, place and situation with Mod A multimodal cues.  SLP Short Term Goal 5 (Week 2): Pt will demonstrate functional problem solving for basic and familiar tasks with Mod A multimodal cueing.  SLP Short Term Goal 6 (Week 2): Pt will identify 1 physical and 1 cognitive deficit with Max A question and visual cues.   Skilled Therapeutic Interventions: Therapeutic intervention complete, with treatment focused on cognitive goals.  Max verbal, gestural, and visual cues were necessary to change topic focus initially.  After that, the patient was able to sustain selective attention to functional task for approximately 10 minutes with two different activities.  He required min verbal and gestural cues to use environment to answer temporal questions.  He was able to maintain topic maintenance for at least three exchanges but was difficult for him to switch topics, requiring max A verbal cues.  Continue with treatment plan.   FIM:  Comprehension Comprehension Mode: Auditory Comprehension: 4-Understands basic 75 - 89% of the time/requires cueing 10 - 24% of the time Expression Expression Mode: Verbal Expression: 4-Expresses basic 75 - 89% of the time/requires cueing 10 - 24% of the time. Needs helper to occlude trach/needs to repeat words. Social Interaction Social  Interaction: 4-Interacts appropriately 75 - 89% of the time - Needs redirection for appropriate language or to initiate interaction. Problem Solving Problem Solving: 3-Solves basic 50 - 74% of the time/requires cueing 25 - 49% of the time Memory Memory: 2-Recognizes or recalls 25 - 49% of the time/requires cueing 51 - 75% of the time FIM - Eating Eating Activity: 5: Set-up assist for open containers  Pain Pain Assessment Pain Assessment: No/denies pain Pain Score: 0-No pain  Therapy/Group: Individual Therapy  Lenny Pastel 12/14/2012, 4:05 PM

## 2012-12-14 NOTE — Progress Notes (Signed)
Physical Therapy Session Note  Patient Details  Name: [name] MRN: [id number] Date of Birth: 1966/01/14  Today's Date: 12/14/2012 Time: 0930-1000 Time Calculation (min): 30 min  Short Term Goals: Week 1:  PT Short Term Goal 1 (Week 1): Pt will perform sit <> stand from bed, wheelchair with min assist.  PT Short Term Goal 1 - Progress (Week 1): Met PT Short Term Goal 2 (Week 1): Pt will verbalize both weight bearing precautions and be able to maintain during mobility > 20% of session with <3 verbal cues.  PT Short Term Goal 2 - Progress (Week 1): Progressing toward goal PT Short Term Goal 3 (Week 1): Pt will ambulate 21' with PFRW and min assist while maintaining preacuations with max verbal cues.  PT Short Term Goal 3 - Progress (Week 1): Met  Therapy Documentation Precautions:  Precautions Precautions: Fall Required Braces or Orthoses: Other Brace/Splint Other Brace/Splint: short arm  cast on Lt. UE, Rt. Bledsoe brace Restrictions Weight Bearing Restrictions: Yes LUE Weight Bearing: Non weight bearing RLE Weight Bearing: Touchdown weight bearing Other Position/Activity Restrictions: No ROM to Rt. knee Pain: neck currently at 7/10 and diminishing since meds from nursing  Therapeutic Activity:(15') Bed mobility and transfer sit<->stand and bed<->w/c<->toilet with S/Mod-I. Patient with poor safety and needing verbal/tactile cues to lock w/c. Gait Training:(15') using L PFRW able to ambulate with S/CGA x 50' and maintaining TDWB R LE   See FIM for current functional status  Therapy/Group: Individual Therapy  Rex Kras 12/14/2012, 9:32 AM

## 2012-12-15 ENCOUNTER — Inpatient Hospital Stay (HOSPITAL_COMMUNITY): Payer: No Typology Code available for payment source

## 2012-12-15 NOTE — Progress Notes (Signed)
Patient ID: [name], male   DOB: [birth year], 47 y.o.   MRN: [id number] Subjective/Complaints: Feels well. Good appetite. Pain under control    Review of Systems otherwise negative     Objective: Vital Signs: Blood pressure 102/65, pulse 69, temperature 98 F (36.7 C), temperature source Oral, resp. rate 18, height 5\' 7"  (1.702 m), weight 79 kg (174 lb 2.6 oz), SpO2 98.00%. No results found. No results found for this or any previous visit (from the past 72 hour(s)).   Vitals reviewed.  HENT:  Head: Normocephalic.  Eyes:  Pupils reactive to light.  Neck: Normal range of motion. Neck supple. No thyromegaly present.  Cardiovascular: Normal rate and regular rhythm.  Pulmonary/Chest: Effort normal and breath sounds normal. No respiratory distress.  Abdominal: Soft. Bowel sounds are normal. He exhibits no distension.  Musculoskeletal:  Left thumb spica splint/cast in place  Neurological:   . He needed multiple cues for place and situation. .  motor strength limited exam secondary to left thumb spica cast and right thumb spica splint and right knee immobilizer.  5/5 bilateral deltoid bicep triceps and left hip flexor knee extensor ankle dorsiflexor plantar flexor  Right ankle dorsiflexor plantar flexor 3/5?Marland Kitchen Sensation intact to light touch in left lower extremities, right lower seems decreased in relation to light touch. intact upper extremities difficult to test secondary to his splints  Patient is distractible. Needs redirection. Speech dysarthric Skin: clean and intact right thigh wound. Staples/sutures in place  Assessment/Plan: 1. 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 Physiatrist is providing close team supervision and 24 hour management of active medical problems listed below. Physiatrist and rehab team  continue to assess barriers to discharge/monitor patient progress toward functional and medical goals.  Spent time educating pt,sister regarding his injury,  Nerve related problems, cognitive deficits FIM: FIM - Bathing Bathing Steps Patient Completed: Chest;Right Arm;Left Arm;Abdomen;Front perineal area;Buttocks;Right upper leg;Left upper leg;Left lower leg (including foot) Bathing: 4: Min-Patient completes 8-9 68f 10 parts or 75+ percent  FIM - Upper Body Dressing/Undressing Upper body dressing/undressing steps patient completed: Thread/unthread right sleeve of pullover shirt/dresss;Thread/unthread left sleeve of pullover shirt/dress;Put head through opening of pull over shirt/dress;Pull shirt over trunk Upper body dressing/undressing: 5: Supervision: Safety issues/verbal cues FIM - Lower Body Dressing/Undressing Lower body dressing/undressing steps patient completed: Thread/unthread left underwear leg;Pull underwear up/down;Thread/unthread left pants leg;Pull pants up/down;Don/Doff left sock;Don/Doff left shoe;Thread/unthread right pants leg Lower body dressing/undressing: 4: Min-Patient completed 75 plus % of tasks  FIM - Toileting Toileting steps completed by patient: Adjust clothing prior to toileting;Performs perineal hygiene;Adjust clothing after toileting Toileting: 5: Set-up assist to: Obtain supplies  FIM - Diplomatic Services operational officer Devices: Grab bars Toilet Transfers: 4-From toilet/BSC: Min A (steadying Pt. > 75%)  FIM - Bed/Chair Transfer Bed/Chair Transfer Assistive Devices: Bed rails Bed/Chair Transfer: 4: Chair or W/C > Bed: Min A (steadying Pt. > 75%)  FIM - Locomotion: Wheelchair Distance: 150 Locomotion: Wheelchair: 4: Travels 150 ft or more: maneuvers on rugs and over door sillls with minimal assistance (Pt.>75%) FIM - Locomotion: Ambulation Locomotion: Ambulation Assistive Devices: TEFL teacher Ambulation/Gait Assistance: 4: Min  assist Locomotion: Ambulation: 1: Travels less than 50 ft with minimal assistance (Pt.>75%)  Comprehension Comprehension Mode: Auditory Comprehension: 4-Understands basic 75 - 89% of the time/requires cueing 10 - 24% of the time  Expression Expression Mode: Verbal  Expression: 4-Expresses basic 75 - 89% of the time/requires cueing 10 - 24% of the time. Needs helper to occlude trach/needs to repeat words.  Social Interaction Social Interaction: 4-Interacts appropriately 75 - 89% of the time - Needs redirection for appropriate language or to initiate interaction.  Problem Solving Problem Solving: 3-Solves basic 50 - 74% of the time/requires cueing 25 - 49% of the time  Memory Memory: 2-Recognizes or recalls 25 - 49% of the time/requires cueing 51 - 75% of the time Medical Problem List and Plan:  1. Traumatic brain injury/ multitrauma  2. DVT Prophylaxis/Anticoagulation: Subcutaneous Lovenox. Monitor platelet counts any signs of bleeding  3. Pain Management: Ultram decreased to 50 mg every 6 hours (sedation) and oxycodone as needed for breakthrough pain. Tolerating therapy activities at present. 4. Neuropsych: This patient is not capable of making decisions on his own behalf.  5. Multiple facial fractures. Status post ORIF of right tripod fracture 12/04/2012    6. Right femur and acetabular as well as patella fracture. Status post ORIF right transverse anterior column with removal of broken hardware and intramedullary nail right femur 11/29/2012. Patient is touchdown weightbearing   -dc remaining staples/sutures 7. Open fracture dislocation, left thumb MCP joint status post ORIF and pinning of fracture with ulnar collateral ligament repair reconstruction of left from 11/29/2012. Patient is nonweightbearing --sutures, cast changed thursday 8. Acute blood loss anemia. Latest hemoglobin up to 8.7 9. History of alcohol and drug abuse. Urine drug screen positive for cocaine.  Provide counseling   10. Lethargy: resolved        LOS (Days) 10 A FACE TO FACE EVALUATION WAS PERFORMED  Ruhani Umland T 12/15/2012, 8:37 AM

## 2012-12-15 NOTE — Progress Notes (Signed)
Occupational Therapy Session Note  Patient Details  Name: [name] MRN: [id number] Date of Birth: 1965-09-20  Today's Date: 12/15/2012 Time: 1000-1100 Time Calculation (min): 60 min  Short Term Goals: Week 2:  OT Short Term Goal 1 (Week 2): Pt will be oriented x 3 with min questiong cues OT Short Term Goal 2 (Week 2): Pt will perform basic toilet transfers with min A consistently OT Short Term Goal 3 (Week 2): Pt will recall LUE and RLE precautions with mod cuing during functional tasks OT Short Term Goal 4 (Week 2): Pt will recall and demonstrate compensatory dressing strategies with mod cuing OT Short Term Goal 5 (Week 2): Pt will demonstrate emergent awareness during functional activities with mod cuing  Skilled Therapeutic Interventions: ADL-retraining with emphasis on orientation and awareness, adherence to precautions and general safety awareness, and functional endurance.   Patient alert and oriented X3 this date, able to state date, location, and situation, and demo'd good safety awareness during bed mobility and transfer to w/c.  Pateint performed ADL sitting and standing with assist to manage orthosis and application of TEDs required.   Patient sustained attention to bathing/dressing task for >15 min, while discussing his concern over a call from his grandmother and while relating her role in raising him.   After bathing, pt was assisted to bathroom using his RW, where he voided urine while standing, with steadying assist, and with use of grab bar for support.    Patient completed session by ambulating approx 150', using RW w/supervision to attend arm support, contact guard assist, and 1 4-minute rest break, seated.   Patient was very interactive this date with Clinical research associate, relating his spiritual/existential philosophies (pseudo-biblical, contemporary Christian-based).   Patient returned to room but was transferred to w/c to wait for maintenance of room while within sight of RN, guard  belt secured.  Therapy Documentation Precautions:  Precautions Precautions: Fall Required Braces or Orthoses: Other Brace/Splint Other Brace/Splint: short arm  cast on Lt. UE, Rt. Bledsoe brace Restrictions Weight Bearing Restrictions: Yes LUE Weight Bearing: Non weight bearing RLE Weight Bearing: Touchdown weight bearing Other Position/Activity Restrictions: No ROM to Rt. knee  Pain: Pain Assessment Pain Assessment: 0-10 Pain Score: 7  Pain Type: Surgical pain Pain Location: Leg Pain Orientation: Upper Multiple Pain Sites: No  Therapy/Group: Individual Therapy  Georgeanne Nim 12/15/2012, 12:37 PM

## 2012-12-16 ENCOUNTER — Inpatient Hospital Stay (HOSPITAL_COMMUNITY): Payer: No Typology Code available for payment source

## 2012-12-16 ENCOUNTER — Inpatient Hospital Stay (HOSPITAL_COMMUNITY): Payer: No Typology Code available for payment source | Admitting: Physical Therapy

## 2012-12-16 ENCOUNTER — Inpatient Hospital Stay (HOSPITAL_COMMUNITY): Payer: No Typology Code available for payment source | Admitting: Speech Pathology

## 2012-12-16 ENCOUNTER — Inpatient Hospital Stay (HOSPITAL_COMMUNITY): Payer: Self-pay

## 2012-12-16 NOTE — Progress Notes (Signed)
Occupational Therapy Session Note  Patient Details  Name: [name] MRN: [id number] Date of Birth: 06/12/1965  Today's Date: 12/16/2012  Session 1 Time: 0700-0800 Time Calculation (min): 60 min  Short Term Goals: Week 2:  OT Short Term Goal 1 (Week 2): Pt will be oriented x 3 with min questiong cues OT Short Term Goal 2 (Week 2): Pt will perform basic toilet transfers with min A consistently OT Short Term Goal 3 (Week 2): Pt will recall LUE and RLE precautions with mod cuing during functional tasks OT Short Term Goal 4 (Week 2): Pt will recall and demonstrate compensatory dressing strategies with mod cuing OT Short Term Goal 5 (Week 2): Pt will demonstrate emergent awareness during functional activities with mod cuing  Skilled Therapeutic Interventions/Progress Updates:    Pt resting in bed upon arrival but eager to bathe and dress this morning.  Pt completed bathing and dressing with sit<>stand from w/c at sink.  Pt completed 90% of bathing tasks while standing at sink requiring max verbal and tactile cues to adhere to weight bearing precautions.  Pt's conversation was very tangential this morning.  Pt was able to attend to bathing and dressing tasks throughout session while engaged in conversation with therapist.  Pt requires max verbal cues for LUE weight bearing during transfers and sit<>stand.  Focus on orientation, safety awareness, dynamic standing balance, attention to task, and activity tolerance.  Therapy Documentation Precautions:  Precautions Precautions: Fall Required Braces or Orthoses: Other Brace/Splint Other Brace/Splint: short arm  cast on Lt. UE, Rt. Bledsoe brace Restrictions Weight Bearing Restrictions: Yes LUE Weight Bearing: Non weight bearing RLE Weight Bearing: Touchdown weight bearing Other Position/Activity Restrictions: No ROM to Rt. knee   Pain: Pain Assessment Pain Assessment: No/denies pain Pain Score: 7  Faces Pain Scale: No hurt  See FIM  for current functional status  Therapy/Group: Individual Therapy  Session 2 Pt missed 30 mins skilled OT services.  Pt off unit to Xray.  [name] [facility] 12/16/2012, 1:20 PM
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
## 2012-12-16 NOTE — Progress Notes (Signed)
Speech Language Pathology Daily Session Note  Patient Details  Name: [name] MRN: [id number] Date of Birth: 10-29-1965  Today's Date: 12/16/2012 Time: 1015 -1100  Time Calculation (min): 45 min  Short Term Goals: Week 2: SLP Short Term Goal 1 (Week 2): Pt will maintain topic of conversation for 3 turns with Mod A mutlimodal cueing SLP Short Term Goal 2 (Week 2): Pt will recall safety precautions with Max A multimodal cueing. SLP Short Term Goal 3 (Week 2): Pt will demonstrate selective attention to a functional task for 15 minutes with Mod A verbal cues for redirection  SLP Short Term Goal 4 (Week 2): Pt will orient to time, place and situation with Mod A multimodal cues.  SLP Short Term Goal 5 (Week 2): Pt will demonstrate functional problem solving for basic and familiar tasks with Mod A multimodal cueing.  SLP Short Term Goal 6 (Week 2): Pt will identify 1 physical and 1 cognitive deficit with Max A question and visual cues.   Skilled Therapeutic Interventions: Skilled treatment focused on cognitive goals. SLP facilitated memory card game with use of repetition and associations to facilitate recall. Pt required moderate-maximum association, semantic, and phonemic cues for delayed recall. He sustained his attention to the game with minimal cues for redirection from SLP. Continue plan of care.   FIM:  Comprehension Comprehension Mode: Auditory Comprehension: 4-Understands basic 75 - 89% of the time/requires cueing 10 - 24% of the time Expression Expression Mode: Verbal Expression: 4-Expresses basic 75 - 89% of the time/requires cueing 10 - 24% of the time. Needs helper to occlude trach/needs to repeat words. Social Interaction Social Interaction: 4-Interacts appropriately 75 - 89% of the time - Needs redirection for appropriate language or to initiate interaction. Problem Solving Problem Solving: 3-Solves basic 50 - 74% of the time/requires cueing 25 - 49% of the  time Memory Memory: 3-Recognizes or recalls 50 - 74% of the time/requires cueing 25 - 49% of the time FIM - Eating Eating Activity: 5: Set-up assist for open containers  Pain Pain Assessment Pain Assessment: No/denies pain  Therapy/Group: Individual Therapy  Maxcine Ham 12/16/2012, 11:58 AM  Maxcine Ham, M.A. CCC-SLP

## 2012-12-16 NOTE — Progress Notes (Signed)
Physical Therapy Session Note  Patient Details  Name: [name] MRN: [id number] Date of Birth: 1965-10-26  Today's Date: 12/16/2012 Time: 4098-1191 Time Calculation (min): 60 min  Skilled Therapeutic Interventions/Progress Updates:    Wheelchair propulsion during session with supervision when not ambulating, worked on pathfinding and problem solving in variety of environments with varying degrees of distracting environments. Frequent cues needed for not using Lt. Hand. Ambulation x ~120' with platform RW (PFRW) and Rt. LE suspended in theraband sling attached to walker, up to min assist for balance. Pt continues to be confused by weight bearing precautions, i.e. reported "it's ok I can just walk to the bathroom I don't need this walker." Ambulation in home environment over carpet with PFRW, to/from bed and low couch, and around kitchen with loud TV noise for distraction, min cues for attention. Close supervision for couch and bed transfers. Memory/balance task standing at counter reaching for spices able to recall 2/3 items but unable to recall third item with every trial even with cues. Bathroom mobility with PFRW and min-guard assist, cues for safety.     Therapy Documentation Precautions:  Precautions Precautions: Fall Required Braces or Orthoses: Other Brace/Splint Other Brace/Splint: short arm  cast on Lt. UE, Rt. Bledsoe brace Restrictions Weight Bearing Restrictions: Yes LUE Weight Bearing: Non weight bearing RLE Weight Bearing: Touchdown weight bearing Other Position/Activity Restrictions: No ROM to Rt. knee Pain: Pain Assessment Pain Assessment: No/denies pain   See FIM for current functional status  Therapy/Group: Individual Therapy  [name] 12/16/2012, 12:22 PM

## 2012-12-16 NOTE — Plan of Care (Signed)
Problem: RH PAIN MANAGEMENT Goal: RH STG PAIN MANAGED AT OR BELOW PT'S PAIN GOAL Pain level less than 4  Outcome: Not Progressing Not progressing patient rate pain at 6

## 2012-12-16 NOTE — Progress Notes (Signed)
Speech Language Pathology Daily Session Note  Patient Details  Name: [name] MRN: [id number] Date of Birth: 04/11/1966  Today's Date: 12/16/2012 Time: 0800-0840 Time Calculation (min): 40 min  Short Term Goals: Week 2: SLP Short Term Goal 1 (Week 2): Pt will maintain topic of conversation for 3 turns with Mod A mutlimodal cueing SLP Short Term Goal 2 (Week 2): Pt will recall safety precautions with Max A multimodal cueing. SLP Short Term Goal 3 (Week 2): Pt will demonstrate selective attention to a functional task for 15 minutes with Mod A verbal cues for redirection  SLP Short Term Goal 4 (Week 2): Pt will orient to time, place and situation with Mod A multimodal cues.  SLP Short Term Goal 5 (Week 2): Pt will demonstrate functional problem solving for basic and familiar tasks with Mod A multimodal cueing.  SLP Short Term Goal 6 (Week 2): Pt will identify 1 physical and 1 cognitive deficit with Max A question and visual cues.   Skilled Therapeutic Interventions: Treatment focus on dysphagia and cognitive goals. SLP facilitated session by providing extra time and supervision verbal cues for problem solving with tray set-up. Pt consumed regular textures with thin liquids without overt s/s of aspiration. Pt required Max A multimodal cueing for orientation to situation and time and for intellectual awareness into impairments.  Pt also required Mod A verbal and question cues for alternating attention between functional conversation and self-feeding.    FIM:  Comprehension Comprehension Mode: Auditory Comprehension: 4-Understands basic 75 - 89% of the time/requires cueing 10 - 24% of the time Expression Expression Mode: Verbal Expression: 5-Expresses basic 90% of the time/requires cueing < 10% of the time. Social Interaction Social Interaction: 4-Interacts appropriately 75 - 89% of the time - Needs redirection for appropriate language or to initiate interaction. Problem  Solving Problem Solving: 3-Solves basic 50 - 74% of the time/requires cueing 25 - 49% of the time Memory Memory: 3-Recognizes or recalls 50 - 74% of the time/requires cueing 25 - 49% of the time FIM - Eating Eating Activity: 5: Set-up assist for open containers  Pain Pain Assessment Pain Assessment: No/denies pain   Therapy/Group: Individual Therapy  Waleska Buttery 12/16/2012, 12:04 PM

## 2012-12-16 NOTE — Progress Notes (Addendum)
Physical Therapy Session Note  Patient Details  Name: [name] MRN: [id number] Date of Birth: 17-Sep-1965  Today's Date: 12/16/2012 Time: 4782-9562 Time Calculation (min): 30 min   Skilled Therapeutic Interventions/Progress Updates:  This tx session focused on amb endurance and dynamic standing balance. Pt able to amb 175' w/ L PFRW, pt continues to benefit from use of theraband attached to RW to maintain NWB R LE during amb as pt unable to follow TDWB precuations. Pt demonstrates slow, but steady pace during amb w/ min-guard. Pt performed dynamic standing balance activity requiring ball toss with subsequent performance of charades of items identified on ball. Pt able to successfully identify charade performed by therapist as well as act out item/action for therapist.  Pt req min-guard/min A during activity due to slight impulsivity to perform movement. Pt also req 1 seated rest break during activity due to feeling light headed, but was able to resume activity after seated rest break.      Therapy Documentation Precautions:  Precautions Precautions: Fall Required Braces or Orthoses: Other Brace/Splint Other Brace/Splint: short arm  cast on Lt. UE, Rt. Bledsoe brace Restrictions Weight Bearing Restrictions: Yes LUE Weight Bearing: Non weight bearing RLE Weight Bearing: Touchdown weight bearing Other Position/Activity Restrictions: No ROM to Rt. knee  Vitals: BP: 104/64 at 14:53 in sitting, feeling of lightheadedness improved w/ seated rest.   Pain: Pain Assessment Pain Assessment: No/denies pain  See FIM for current functional status  Therapy/Group: Individual Therapy  Denzil Hughes 12/16/2012, 3:35 PM

## 2012-12-17 ENCOUNTER — Inpatient Hospital Stay (HOSPITAL_COMMUNITY): Payer: No Typology Code available for payment source

## 2012-12-17 ENCOUNTER — Inpatient Hospital Stay (HOSPITAL_COMMUNITY): Payer: Self-pay | Admitting: Physical Therapy

## 2012-12-17 ENCOUNTER — Inpatient Hospital Stay (HOSPITAL_COMMUNITY): Payer: No Typology Code available for payment source | Admitting: Speech Pathology

## 2012-12-17 ENCOUNTER — Inpatient Hospital Stay (HOSPITAL_COMMUNITY): Payer: No Typology Code available for payment source | Admitting: Physical Therapy

## 2012-12-17 NOTE — Progress Notes (Signed)
Agree with the above

## 2012-12-17 NOTE — Progress Notes (Signed)
Speech Language Pathology Daily Session Note  Patient Details  Name: [name] MRN: [id number] Date of Birth: 10/18/1965  Today's Date: 12/17/2012 Time: 4401-0272 Time Calculation (min): 45 min  Short Term Goals: Week 1: SLP Short Term Goal 1 (Week 1): Pt will orient to time, place and situation with Max A multimodal cues.  SLP Short Term Goal 1 - Progress (Week 1): Met SLP Short Term Goal 2 (Week 1): Pt will demonstrate sustained attention to a functional task for 5 minutes with Max A verbal cues for redirection  SLP Short Term Goal 2 - Progress (Week 1): Met SLP Short Term Goal 3 (Week 1): Pt will recall safety precautions with Max A multimodal cueing SLP Short Term Goal 3 - Progress (Week 1): Not met SLP Short Term Goal 4 (Week 1): Pt will maintain topic of conversation for 2 turns with Max A mutlimodal cueing SLP Short Term Goal 4 - Progress (Week 1): Met  Skilled Therapeutic Interventions: Skilled treatment focused on cognitive goals. SLP facilitated session with Min question cues for intellectual awareness of physical deficits and Mod question cues for safety. Pt played card game with basic calculations in a distracting environment with Mod verbalk cues for redirection and mathematic accuracy. Continue plan of care.   FIM:  Comprehension Comprehension Mode: Auditory Comprehension: 4-Understands basic 75 - 89% of the time/requires cueing 10 - 24% of the time Expression Expression Mode: Verbal Expression: 5-Expresses basic 90% of the time/requires cueing < 10% of the time. Social Interaction Social Interaction: 4-Interacts appropriately 75 - 89% of the time - Needs redirection for appropriate language or to initiate interaction. Problem Solving Problem Solving: 4-Solves basic 75 - 89% of the time/requires cueing 10 - 24% of the time Memory Memory: 3-Recognizes or recalls 50 - 74% of the time/requires cueing 25 - 49% of the time FIM - Eating Eating Activity: 5:  Supervision/cues  Pain Pain Assessment Pain Assessment: No/denies pain Pain Score: 0-No pain  Therapy/Group: Individual Therapy  Maxcine Ham 12/17/2012, 1:52 PM  Maxcine Ham, M.A. CCC-SLP

## 2012-12-17 NOTE — Progress Notes (Signed)
Occupational Therapy Session Note  Patient Details  Name: [name] MRN: [id number] Date of Birth: 06/11/1965  Today's Date: 12/17/2012  Session 1 Time: 0700-0800 Time Calculation (min): 60 min  Short Term Goals: Week 2:  OT Short Term Goal 1 (Week 2): Pt will be oriented x 3 with min questiong cues OT Short Term Goal 2 (Week 2): Pt will perform basic toilet transfers with min A consistently OT Short Term Goal 3 (Week 2): Pt will recall LUE and RLE precautions with mod cuing during functional tasks OT Short Term Goal 4 (Week 2): Pt will recall and demonstrate compensatory dressing strategies with mod cuing OT Short Term Goal 5 (Week 2): Pt will demonstrate emergent awareness during functional activities with mod cuing  Skilled Therapeutic Interventions/Progress Updates:    Pt resting in bed upon arrival and stated he was ready to get his day going.  Pt bathed RLE (upper) while in bed after Bledsoe brace removed.  Pt required verbal cues to not flex knee when brace off.  Pt completed bathing and dressing tasks with sit<>stand at sink.  Pt continues to require verbal cues for weight bearing precautions. Pt demonstrated alternating attention as evidenced by pt's ability to complete bathing and dressing tasks while TV was on and consistently commented appropriately to activities on TV.  Pt required min verbal cues for orientation this morning.  Focus on safety awareness, transfers, dynamic standing balance, orientation, attention to task, and activity tolerance.   Therapy Documentation Precautions:  Precautions Precautions: Fall Required Braces or Orthoses: Other Brace/Splint Other Brace/Splint: short arm  cast on Lt. UE, Rt. Bledsoe brace Restrictions Weight Bearing Restrictions: Yes LUE Weight Bearing: Non weight bearing RLE Weight Bearing: Touchdown weight bearing Other Position/Activity Restrictions: No ROM to Rt. knee Pain: Pain Assessment Pain Assessment: No/denies  pain  See FIM for current functional status  Therapy/Group: Individual Therapy  Session 2 Time: 1130-1200 Pt denies pain; patient stated he had been premedicated for headache Individual Therapy  Pt engaged in dynamic standing activities (card games) with focus on maintaining weight bearing precautions while engaged in functional activity.  I had to place my foot under his heel to provide tactile cues for patient in addition to verbal cues.    [name] [facility] 12/17/2012, 8:05 AM

## 2012-12-17 NOTE — Progress Notes (Signed)
Speech Language Pathology Daily Session Note  Patient Details  Name: [name] MRN: [id number] Date of Birth: 1966-02-22  Today's Date: 12/17/2012 Time: 1000-1040 Time Calculation (min): 40 min  Short Term Goals: Week 2: SLP Short Term Goal 1 (Week 2): Pt will maintain topic of conversation for 3 turns with Mod A mutlimodal cueing SLP Short Term Goal 2 (Week 2): Pt will recall safety precautions with Max A multimodal cueing. SLP Short Term Goal 3 (Week 2): Pt will demonstrate selective attention to a functional task for 15 minutes with Mod A verbal cues for redirection  SLP Short Term Goal 4 (Week 2): Pt will orient to time, place and situation with Mod A multimodal cues.  SLP Short Term Goal 5 (Week 2): Pt will demonstrate functional problem solving for basic and familiar tasks with Mod A multimodal cueing.  SLP Short Term Goal 6 (Week 2): Pt will identify 1 physical and 1 cognitive deficit with Max A question and visual cues.   Skilled Therapeutic Interventions: Treatment focus on cognitive goals. SLP facilitated session by providing Mod A question and visual cues for numerical organization task in a mildly distracting environment. Pt also unable to alternate attention between organization task and functional conversation and required Mod verbal cues for redirection to task. Pt demonstrated increased awareness into physical and cognitive deficits and goals of skilled intervention.    FIM:  Comprehension Comprehension Mode: Auditory Comprehension: 4-Understands basic 75 - 89% of the time/requires cueing 10 - 24% of the time Expression Expression Mode: Verbal Expression: 5-Expresses basic 90% of the time/requires cueing < 10% of the time. Social Interaction Social Interaction: 4-Interacts appropriately 75 - 89% of the time - Needs redirection for appropriate language or to initiate interaction. Problem Solving Problem Solving: 3-Solves basic 50 - 74% of the time/requires cueing  25 - 49% of the time Memory Memory: 3-Recognizes or recalls 50 - 74% of the time/requires cueing 25 - 49% of the time  Pain Pain Assessment Pain Assessment: No/denies pain Pain Score: 0-No pain  Therapy/Group: Individual Therapy  [name] 12/17/2012, 12:25 PM

## 2012-12-17 NOTE — Progress Notes (Signed)
Physical Therapy Session Note  Patient Details  Name: [name] MRN: [id number] Date of Birth: [birth year]  Today's Date: 12/17/2012 Time: 4098-1191 Time Calculation (min): 36 min  Short Term Goals: Week 2:  PT Short Term Goal 1 (Week 2): Pt will perform t/f sit<>stand from both bed and w/c w/ SBA. PT Short Term Goal 2 (Week 2): Pt will verbalize both weight bearing precautions and be able to maintain during >20% of session w/ <3 verbal cues.  PT Short Term Goal 2 - Progress (Week 2): Not met PT Short Term Goal 3 (Week 2): Pt will amb x100' w/ PFRW and CGA assist while maintaining precautions w/ mod verbal cues.  Skilled Therapeutic Interventions/Progress Updates:    This session focused on bed mobility, transfers, WC mobility and gait.  Bed mobility supervision.  Assist needed to apply hinged brace.  Verbal explaination of why he needs to wear hinged brace. Transfers bed to WC min assist for balance.  Max verbal cues for TDWB right leg.  WC mobility >150' with supervision.  Verbal cues to attend to task at hand.  Transfer from [facility] to mat table min assist.  Again, max verbal cues for safety as pt did not lock WC breaks before standing and continues to put more weight through right leg than is allowed.  Gait with L PFRW min assist for balance especially while turning 80'.  Right foot maintained in NWB with t-band looped on RW.  Reinforced reasoning why pt cannot put weight through right foot (so that his leg can heal properly). Pt unable to report this back to me within 10 mins of reviewing.  Safety and awareness of precautions are pt's biggest deficits.    Therapy Documentation Precautions:  Precautions Precautions: Fall Required Braces or Orthoses: Other Brace/Splint Other Brace/Splint: short arm  cast on Lt. UE, Rt. Bledsoe brace Restrictions Weight Bearing Restrictions: Yes LUE Weight Bearing: Non weight bearing RLE Weight Bearing: Touchdown weight bearing Other Position/Activity  Restrictions: No ROM to Rt. knee   Vital Signs: Therapy Vitals Temp: 98.4 F (36.9 C) Temp src: Oral Pulse Rate: 87 Resp: 18 BP: 109/69 mmHg Patient Position, if appropriate: Lying Oxygen Therapy SpO2: 99 % O2 Device: None (Room air) Pain: Pain Assessment Pain Assessment: No/denies pain Pain Score: 0-No pain   Locomotion : Ambulation Ambulation/Gait Assistance: 4: Min assist Wheelchair Mobility Distance: 150   See FIM for current functional status  Therapy/Group: Individual Therapy  Lurena Joiner B. Sonny Poth, PT, DPT [phone]   12/17/2012, 3:33 PM

## 2012-12-17 NOTE — Progress Notes (Signed)
Physical Therapy Session Note  Patient Details  Name: [name] MRN: [id number] Date of Birth: 1965/08/25  Today's Date: 12/17/2012 Time: 4098-1191 Time Calculation (min): 55 min  Short Term Goals: Week 2:  PT Short Term Goal 1 (Week 2): Pt will perform t/f sit<>stand from both bed and w/c w/ SBA. PT Short Term Goal 2 (Week 2): Pt will verbalize both weight bearing precautions and be able to maintain during >20% of session w/ <3 verbal cues.  PT Short Term Goal 2 - Progress (Week 2): Not met PT Short Term Goal 3 (Week 2): Pt will amb x100' w/ PFRW and CGA assist while maintaining precautions w/ mod verbal cues.  Skilled Therapeutic Interventions/Progress Updates:    Able to ambulate to/from gym (~150' each way) today with constant min-guard assist, platform RW (PFRW) with theraband sling for Rt. LE, and frequent cues for safety. Pt not oriented to place or situation. Ambulation x 40' to table, standing balance on Lt. LE with Lt. UE support (on platform) while playing [employer] cognitive game with sister. Pt needed mod verbal cues for simple addition, attention, and simple problem solving. Obstacle course consisting of negotiation of cones and hopping over poles, min assist with PFRW, assist needed to hop over obstacle and negotiate walker.   Sister present for treatment, supportive but reports difficulty seeing pt have trouble with his memory and awareness.   Therapy Documentation Precautions:  Precautions Precautions: Fall Required Braces or Orthoses: Other Brace/Splint Other Brace/Splint: short arm  cast on Lt. UE, Rt. Bledsoe brace Restrictions Weight Bearing Restrictions: Yes LUE Weight Bearing: Non weight bearing RLE Weight Bearing: Touchdown weight bearing Other Position/Activity Restrictions: No ROM to Rt. knee Pain: Pain Assessment Pain Assessment: No/denies pain Pain Score: 0-No pain  See FIM for current functional status  Therapy/Group: Individual  Therapy  [name] 12/17/2012, 12:04 PM

## 2012-12-17 NOTE — Progress Notes (Signed)
Patient ID: [name], male   DOB: September 04, 1965, 47 y.o.   MRN: [id number] Subjective/Complaints: Didn't sleep too well. Had tv on all night though    Review of Systems otherwise negative     Objective: Vital Signs: Blood pressure 95/62, pulse 102, temperature 98.3 F (36.8 C), temperature source Oral, resp. rate 19, height 5\' 7"  (1.702 m), weight 79 kg (174 lb 2.6 oz), SpO2 99.00%. Dg Finger Thumb Left  12/16/2012   *RADIOLOGY REPORT*  Clinical Data: Status post surgery 1 week ago.  LEFT THUMB 2+V  Comparison: None.  Findings: A pin transfixes the first metacarpal phalangeal joint of the left hand.  No acute fractures identified.  Splinting material overlies the hand.  IMPRESSION: Status post ORIF of the first carpometacarpal joint.   Original Report Authenticated By: Norva Pavlov, M.D.   No results found for this or any previous visit (from the past 72 hour(s)).   Vitals reviewed.  HENT:  Head: Normocephalic.  Eyes:  Pupils reactive to light.  Neck: Normal range of motion. Neck supple. No thyromegaly present.  Cardiovascular: Normal rate and regular rhythm.  Pulmonary/Chest: Effort normal and breath sounds normal. No respiratory distress.  Abdominal: Soft. Bowel sounds are normal. He exhibits no distension.  Musculoskeletal:  Left thumb spica splint/cast in place  Neurological:   . He needed multiple cues for place and situation. .  motor strength limited exam secondary to left thumb spica cast and right thumb spica splint and right knee immobilizer.  5/5 bilateral deltoid bicep triceps and left hip flexor knee extensor ankle dorsiflexor plantar flexor  Right ankle dorsiflexor plantar flexor 3/5. Sensation intact to light touch in left lower extremities, right lower seems decreased in relation to light touch. intact upper extremities difficult to test secondary to his splints  Patient is distractible. Needs redirection. Speech dysarthric Skin: clean and intact right  thigh wound. Staples/sutures in place  Assessment/Plan: 1. 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 Physiatrist is providing close team supervision and 24 hour management of active medical problems listed below. Physiatrist and rehab team continue to assess barriers to discharge/monitor patient progress toward functional and medical goals.  Spent time educating pt,sister regarding his injury,  Nerve related problems, cognitive deficits FIM: FIM - Bathing Bathing Steps Patient Completed: Chest;Right Arm;Left Arm;Abdomen;Front perineal area;Buttocks;Right upper leg;Left upper leg;Left lower leg (including foot) Bathing: 4: Min-Patient completes 8-9 30f 10 parts or 75+ percent  FIM - Upper Body Dressing/Undressing Upper body dressing/undressing steps patient completed: Thread/unthread right sleeve of pullover shirt/dresss;Thread/unthread left sleeve of pullover shirt/dress;Put head through opening of pull over shirt/dress;Pull shirt over trunk Upper body dressing/undressing: 5: Set-up assist to: Obtain clothing/put away FIM - Lower Body Dressing/Undressing Lower body dressing/undressing steps patient completed: Thread/unthread left underwear leg;Thread/unthread right underwear leg;Pull underwear up/down;Thread/unthread right pants leg;Thread/unthread left pants leg;Pull pants up/down;Don/Doff left shoe;Fasten/unfasten pants;Don/Doff left sock Lower body dressing/undressing: 4: Min-Patient completed 75 plus % of tasks  FIM - Toileting Toileting steps completed by patient: Performs perineal hygiene Toileting Assistive Devices: Grab bar or rail for support Toileting: 2: Max-Patient completed 1 of 3 steps  FIM - Diplomatic Services operational officer Devices: Art gallery manager Transfers: 4-To toilet/BSC: Min A (steadying Pt. >  75%);4-From toilet/BSC: Min A (steadying Pt. > 75%)  FIM - Bed/Chair Transfer Bed/Chair Transfer Assistive Devices: Bed rails Bed/Chair Transfer: 0: Activity did not occur  FIM - Locomotion: Wheelchair Distance: 150 Locomotion: Wheelchair: 5: Travels 150 ft or more: maneuvers on rugs and over door sills with supervision, cueing or coaxing FIM - Locomotion: Ambulation Locomotion: Ambulation Assistive Devices: TEFL teacher Ambulation/Gait Assistance: 4: Min assist Locomotion: Ambulation: 2: Travels 50 - 149 ft with minimal assistance (Pt.>75%)  Comprehension Comprehension Mode: Auditory Comprehension: 4-Understands basic 75 - 89% of the time/requires cueing 10 - 24% of the time  Expression Expression Mode: Verbal Expression: 5-Expresses basic 90% of the time/requires cueing < 10% of the time.  Social Interaction Social Interaction: 4-Interacts appropriately 75 - 89% of the time - Needs redirection for appropriate language or to initiate interaction.  Problem Solving Problem Solving: 3-Solves basic 50 - 74% of the time/requires cueing 25 - 49% of the time  Memory Memory: 3-Recognizes or recalls 50 - 74% of the time/requires cueing 25 - 49% of the time Medical Problem List and Plan:  1. Traumatic brain injury/ multitrauma  2. DVT Prophylaxis/Anticoagulation: Subcutaneous Lovenox. Monitor platelet counts any signs of bleeding  3. Pain Management: Ultram decreased to 50 mg every 6 hours (sedation) and oxycodone as needed for breakthrough pain. Tolerating therapy activities at present. Pain control adequate 4. Neuropsych: This patient is not capable of making decisions on his own behalf.  5. Multiple facial fractures. Status post ORIF of right tripod fracture 12/04/2012    6. Right femur and acetabular as well as patella fracture. Status post ORIF right transverse anterior column with removal of broken hardware and intramedullary nail right femur 11/29/2012. Patient is touchdown  weightbearing   -dc'ed remaining staples/sutures 7. Open fracture dislocation, left thumb MCP joint status post ORIF and pinning of fracture with ulnar collateral ligament repair reconstruction of left from 11/29/2012. Patient is nonweightbearing --sutures, cast changed thursday 8. Acute blood loss anemia. Latest hemoglobin up to 8.7 9. History of alcohol and drug abuse. Urine drug screen positive for cocaine.  Provide counseling  10. Lethargy: resolved        LOS (Days) 12 A FACE TO FACE EVALUATION WAS PERFORMED  SWARTZ,ZACHARY T 12/17/2012, 7:59 AM

## 2012-12-18 ENCOUNTER — Inpatient Hospital Stay (HOSPITAL_COMMUNITY): Payer: No Typology Code available for payment source | Admitting: Physical Therapy

## 2012-12-18 ENCOUNTER — Inpatient Hospital Stay (HOSPITAL_COMMUNITY): Payer: No Typology Code available for payment source

## 2012-12-18 DIAGNOSIS — S069X9A Unspecified intracranial injury with loss of consciousness of unspecified duration, initial encounter: Secondary | ICD-10-CM

## 2012-12-18 NOTE — Progress Notes (Signed)
Speech Language Pathology Daily Session Note  Patient Details  Name: [name] MRN: [id number] Date of Birth: 09/30/1965  Today's Date: 12/18/2012 Time: 4098-1191 Time Calculation (min): 45 min  Short Term Goals: Week 2: SLP Short Term Goal 1 (Week 2): Pt will maintain topic of conversation for 3 turns with Mod A mutlimodal cueing SLP Short Term Goal 2 (Week 2): Pt will recall safety precautions with Max A multimodal cueing. SLP Short Term Goal 3 (Week 2): Pt will demonstrate selective attention to a functional task for 15 minutes with Mod A verbal cues for redirection  SLP Short Term Goal 4 (Week 2): Pt will orient to time, place and situation with Mod A multimodal cues.  SLP Short Term Goal 5 (Week 2): Pt will demonstrate functional problem solving for basic and familiar tasks with Mod A multimodal cueing.  SLP Short Term Goal 6 (Week 2): Pt will identify 1 physical and 1 cognitive deficit with Max A question and visual cues.   Skilled Therapeutic Interventions: Skilled treatment focused on cognitive goals. SLP facilitated session with memory card game with Mod verbal cues for redirection. With Mod verbal cues, pt demonstrated selective attention and recall of newly learned information. Mod question cues provided for orientation to date. Pt appeared more distracted today with sister present for treatment session. Continue plan of care.   FIM:  Comprehension Comprehension Mode: Auditory Comprehension: 4-Understands basic 75 - 89% of the time/requires cueing 10 - 24% of the time Expression Expression Mode: Verbal Expression: 5-Expresses basic 90% of the time/requires cueing < 10% of the time. Social Interaction Social Interaction: 4-Interacts appropriately 75 - 89% of the time - Needs redirection for appropriate language or to initiate interaction. Problem Solving Problem Solving: 4-Solves basic 75 - 89% of the time/requires cueing 10 - 24% of the time Memory Memory:  3-Recognizes or recalls 50 - 74% of the time/requires cueing 25 - 49% of the time FIM - Eating Eating Activity: 6: More than reasonable amount of time  Pain Pain Assessment Pain Assessment: No/denies pain Pain Score: 7  Pain Intervention(s): Medication (See eMAR)  Therapy/Group: Individual Therapy  Maxcine Ham 12/18/2012, 12:08 PM  Maxcine Ham, M.A. CCC-SLP

## 2012-12-18 NOTE — Progress Notes (Signed)
Occupational Therapy Session Note  Patient Details  Name: [name] MRN: [id number] Date of Birth: Mar 10, 1966  Today's Date: 12/18/2012  Session 1 Time: 0800-0857 Time Calculation (min): 57 min  Short Term Goals: Week 2:  OT Short Term Goal 1 (Week 2): Pt will be oriented x 3 with min questiong cues OT Short Term Goal 2 (Week 2): Pt will perform basic toilet transfers with min A consistently OT Short Term Goal 3 (Week 2): Pt will recall LUE and RLE precautions with mod cuing during functional tasks OT Short Term Goal 4 (Week 2): Pt will recall and demonstrate compensatory dressing strategies with mod cuing OT Short Term Goal 5 (Week 2): Pt will demonstrate emergent awareness during functional activities with mod cuing  Skilled Therapeutic Interventions/Progress Updates:    Pt engaged in bathing and dressing tasks with sit<>stand from w/c at sink.  Pt stated that he wanted to take his brace off his RLE to see if he could stand better.  Reeducated patient on purpose of brace and the fact that he wasn't supposed to weight bear through RLE.  Pt again verbalized understanding.  Pt was able to bathe RLE while he remained in bed before transferring to w/c.  Pt required verbal cues for weight bearing precautions during transfer and when standing at sink to bathe.  Currently patient requires steady assist when standing and verbal cues.  Pt only requires assistance with donning right sock.  Pt exhibited divided attention throughout ALD session this morning.  Therapy Documentation Precautions:  Precautions Precautions: Fall Required Braces or Orthoses: Other Brace/Splint Other Brace/Splint: short arm  cast on Lt. UE, Rt. Bledsoe brace Restrictions Weight Bearing Restrictions: Yes LUE Weight Bearing: Non weight bearing RLE Weight Bearing: Touchdown weight bearing Other Position/Activity Restrictions: No ROM to Rt. knee  See FIM for current functional status  Therapy/Group: Individual  Therapy  Time: 1300-1400 Pt denies pain Individual Therapy  Pt engaged in learning new card games while seated in day room with mod distractions.  Pt grasped concept/object of new games quickly but required mod verbal cues for redirection to games.  Pt easily distracted by people talking and walking in hallway.  Pt'v conversation was tangential throughout session and he was distracted by activities in surroundings.  Pt requested to use toilet at end of session.  Pt again required verbal cues to adhere to weight bearing precautions when standing at toilet.  [name] [facility] 12/18/2012, 8:59 AM
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
## 2012-12-18 NOTE — Progress Notes (Signed)
Physical Therapy Note  Patient Details  Name: [name] MRN: [id number] Date of Birth: 09/25/1965 Today's Date: 12/18/2012  1415-1510 (55 minutes) individual Pain: no reported pain Precautions: NWB LT UE/ TDWB RT LE Focus of treatment: RT LE strengthening in Bledsoe brace to improve functional mobility; gait training NWB RT LE (pt unable to maintain TDWB ) Treatment: Transfers stand/turn LT PFRW min to SBA with RT LE in theraband sling on walker to maintain NWB; sit > supine min assist RT LE; Therapeutic exercises X 20 - ankle pumps, quad sets, glut sets, AA RT hip ab/adduction; gait 50 feet X 2 LT PFRW close SBA for safety only using theraband sling to maintain weight bearing status.    4098-1191 (40 minutes) individual Pain: no complaint of pain Focus of treatment: Therapeutic exercise focused on activity tolerance; gait training /endurance Treatment: Transfers SBA LT PFRW using green theraband sling for RT LE to maintain weight bearing precautions; Nustep Level 6 X 20 minutes LT LE/RT UE only ; gait 100 feet with AD as above SBA. Returned to nurses station with quick release belt in place.   Janie Capp,JIM 12/18/2012, 2:53 PM
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
## 2012-12-18 NOTE — Plan of Care (Signed)
Problem: RH PAIN MANAGEMENT Goal: RH STG PAIN MANAGED AT OR BELOW PT'S PAIN GOAL Pain level less than 4  Outcome: Not Progressing Patient rate pain at 6

## 2012-12-19 ENCOUNTER — Encounter (HOSPITAL_COMMUNITY): Payer: Self-pay

## 2012-12-19 ENCOUNTER — Inpatient Hospital Stay (HOSPITAL_COMMUNITY): Payer: Self-pay

## 2012-12-19 ENCOUNTER — Inpatient Hospital Stay (HOSPITAL_COMMUNITY): Payer: No Typology Code available for payment source | Admitting: Physical Therapy

## 2012-12-19 ENCOUNTER — Inpatient Hospital Stay (HOSPITAL_COMMUNITY): Payer: No Typology Code available for payment source

## 2012-12-19 LAB — CREATININE, SERUM
Creatinine, Ser: 1 mg/dL (ref 0.50–1.35)
GFR calc non Af Amer: 89 mL/min — ABNORMAL LOW (ref >90)

## 2012-12-19 NOTE — Progress Notes (Signed)
Speech Language Pathology Weekly & Session Progress Note  Patient Details  Name: AVRAJ LINDROTH MRN: [id number] Date of Birth: 04/24/1966  Today's Date: 12/19/2012 Time: 4098-1191 Time Calculation (min): 58 min  Short Term Goals: Week 2: SLP Short Term Goal 1 (Week 2): Pt will maintain topic of conversation for 3 turns with Mod A mutlimodal cueing SLP Short Term Goal 1 - Progress (Week 2): Met SLP Short Term Goal 2 (Week 2): Pt will recall safety precautions with Max A multimodal cueing. SLP Short Term Goal 2 - Progress (Week 2): Not met SLP Short Term Goal 3 (Week 2): Pt will demonstrate selective attention to a functional task for 15 minutes with Mod A verbal cues for redirection  SLP Short Term Goal 3 - Progress (Week 2): Not met SLP Short Term Goal 4 (Week 2): Pt will orient to time, place and situation with Mod A multimodal cues.  SLP Short Term Goal 4 - Progress (Week 2): Met SLP Short Term Goal 5 (Week 2): Pt will demonstrate functional problem solving for basic and familiar tasks with Mod A multimodal cueing.  SLP Short Term Goal 5 - Progress (Week 2): Met SLP Short Term Goal 6 (Week 2): Pt will identify 1 physical and 1 cognitive deficit with Max A question and visual cues.  SLP Short Term Goal 6 - Progress (Week 2): Not met  New Short Term Goals: Week 3: SLP Short Term Goal 1 (Week 3): Pt will maintain topic of conversation for 5 turns with Mod A mutlimodal cueing SLP Short Term Goal 2 (Week 3): Pt will recall safety precautions with Max A multimodal cueing. SLP Short Term Goal 3 (Week 3): Pt will demonstrate selective attention to a functional task for 15 minutes with Mod A verbal cues for redirection  SLP Short Term Goal 4 (Week 3): Pt will orient to time, place and situation with Min A multimodal cues.  SLP Short Term Goal 5 (Week 3): Pt will demonstrate functional problem solving for basic and familiar tasks with Min A multimodal cueing.  SLP Short Term Goal 6 (Week 3):  Pt will identify 1 physical and 1 cognitive deficit with Max A question and visual cues.   Weekly Progress Updates: Pt continues to make gains in speech therapy, meeting 3 of 6 STGs this week. He requires moderate multimodal cueing for orientation and functional problem solving, as well as moderate-maximum cues for safety awareness, working memory, and intellectual awareness, particularly with his cognitive deficits. Pt has been working on his selective attention, with moderate-maximum cues needed for redirection. He continues to consume regular textures with thin liquids without overt s/s of aspiration, and remains Mod I for use of swallowing compensatory strategies. Pt would benefit from continued SLP intervention to maximize cognitive function and overall functional independence.   SLP Intensity: Minumum of 1-2 x/day, 30 to 90 minutes SLP Frequency: 5 out of 7 days SLP Duration/Estimated Length of Stay: 2 weeks SLP Treatment/Interventions: Cognitive remediation/compensation;Cueing hierarchy;Environmental controls;Internal/external aids;Functional tasks;Patient/family education;Therapeutic Activities  Daily Session Skilled treatment focused on cognitive goals. SLP facilitated breakfast meal with moderate cues for redirection to task and conversational topic in a mildly distracting environment. Pt was able to maintain conversational topic for >3 turns and correctly stated the date and day of the week with Min A question cues. Pt required -maximum cueing for intellectual awareness of cognitive deficits, including memory. Continue plan of care.  FIM:  Comprehension Comprehension Mode: Auditory Comprehension: 5-Understands basic 90% of the time/requires cueing <  10% of the time Expression Expression Mode: Verbal Expression: 5-Expresses basic 90% of the time/requires cueing < 10% of the time. Social Interaction Social Interaction: 4-Interacts appropriately 75 - 89% of the time - Needs redirection  for appropriate language or to initiate interaction. Problem Solving Problem Solving: 4-Solves basic 75 - 89% of the time/requires cueing 10 - 24% of the time Memory Memory: 3-Recognizes or recalls 50 - 74% of the time/requires cueing 25 - 49% of the time FIM - Eating Eating Activity: 6: More than reasonable amount of time  Pain Pain Assessment Pain Assessment: No/denies pain  Therapy/Group: Individual Therapy  Maxcine Ham 12/19/2012, 11:16 AM  Maxcine Ham, M.A. CCC-SLP
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
## 2012-12-19 NOTE — Progress Notes (Signed)
Physical Therapy Weekly Progress Note  Patient Details  Name: [name] MRN: [id number] Date of Birth: February 23, 1966  Today's Date: 12/19/2012 Time: 0900-1000 Time Calculation (min): 60 min  Patient has met 2 of 3 short term goals.  Pt is gaining good strength and is mobilizing well however continues to be most limited by impaired attention, memory, decreased awareness of need for brace and for precautions. Pt needs nearly constant reminders to maintain precautions. He is only able to maintain Rt. LE precautions with ambulating if leg is supported in sling made of theraband tied to PFRW.  Pt is pleasant but is rarely oriented to place or situation.   Patient continues to demonstrate the following deficits: impaired cognition/awareness, decreased ability to maintain precautions, decreased safety with mobility, decreased functional mobility as compared to baseline and therefore will continue to benefit from skilled PT intervention to enhance overall performance with activity tolerance, balance, ability to compensate for deficits, attention, awareness and knowledge of precautions.  Patient progressing toward long term goals..  Plan of care revisions: goals updated due to progress.  PT Short Term Goals Week 2:  PT Short Term Goal 1 (Week 2): Pt will perform t/f sit<>stand from both bed and w/c w/ SBA. PT Short Term Goal 1 - Progress (Week 2): Met PT Short Term Goal 2 (Week 2): Pt will verbalize both weight bearing precautions and be able to maintain during >20% of session w/ <3 verbal cues.  PT Short Term Goal 2 - Progress (Week 2): Not met PT Short Term Goal 3 (Week 2): Pt will amb x100' w/ PFRW and CGA assist while maintaining precautions w/ mod verbal cues. PT Short Term Goal 3 - Progress (Week 2): Met  Skilled Therapeutic Interventions/Progress Updates:    Bathroom mobiltiy with PFRW x 2 episodes (urination at start of treatment, bowel movement at end of session) performed with min-guard  assist however mod to max verbal cues for precautions and use of PFRW (tries to go short distances without device). Ambulation x 150' with PFRW with min-guard assist and use of theraband sling to help pt maintain precautions. Step up/down at curb level backwards with PFRW min-guard assist pt able to manage PFRW up/down without assist when Lt. Arm strapped in. Performed x 4 reps. Dynamic balance hitting tennis ball with racket, balance on Lt. LE with Lt. UE supported on PFRW, up to min assist.    Therapy Documentation Precautions:  Precautions Precautions: Fall Required Braces or Orthoses: Other Brace/Splint Other Brace/Splint: short arm  cast on Lt. UE, Rt. Bledsoe brace Restrictions Weight Bearing Restrictions: Yes LUE Weight Bearing: Non weight bearing RLE Weight Bearing: Touchdown weight bearing Other Position/Activity Restrictions: No ROM to Rt. knee Pain: Pain Assessment Pain Assessment: 0-10 Pain Score: 4 Faces Pain Scale: Hurts little more Pain Type: Surgical pain;Acute pain Pain Location: Leg Pain Orientation: right Pain Descriptors / Indicators: Aching;Dull Pain Frequency: with activity Patients Stated Pain Goal: 0 Pain Intervention(s): Repositioned, RN made aware  See FIM for current functional status  Therapy/Group: Individual Therapy  [name] 12/19/2012, 12:25 PM

## 2012-12-19 NOTE — Progress Notes (Signed)
Occupational Therapy Session Note  Patient Details  Name: [name] MRN: [id number] Date of Birth: 11-12-1965  Today's Date: 12/19/2012  Session 1 Time: 0700-0755 Time Calculation (min): 55 min  Short Term Goals: Week 2:  OT Short Term Goal 1 (Week 2): Pt will be oriented x 3 with min questiong cues OT Short Term Goal 2 (Week 2): Pt will perform basic toilet transfers with min A consistently OT Short Term Goal 3 (Week 2): Pt will recall LUE and RLE precautions with mod cuing during functional tasks OT Short Term Goal 4 (Week 2): Pt will recall and demonstrate compensatory dressing strategies with mod cuing OT Short Term Goal 5 (Week 2): Pt will demonstrate emergent awareness during functional activities with mod cuing  Skilled Therapeutic Interventions/Progress Updates:    Pt resting in bed with sister at bedside.  Pt initially attempted to get OOB before Bledsoe brace had been properly adjusted.  After removing Bledsoe brace for bathe RLE pt attempted to bend knee and had to be reminded not to bend knee.  Pt was able to bathe RLE including foot while in bed.  Pt transferred to w/c with squat pivot transfer to complete bathing and dressing at sink.  Pt continues to require max verbal cues to adhere to weight bearing precautions when transferring and standing.  Focus on attention to task, sequencing, safety awareness, transfers, and dynamic standing balance.  Therapy Documentation Precautions:  Precautions Precautions: Fall Required Braces or Orthoses: Other Brace/Splint Other Brace/Splint: short arm  cast on Lt. UE, Rt. Bledsoe brace Restrictions Weight Bearing Restrictions: Yes LUE Weight Bearing: Non weight bearing RLE Weight Bearing: Touchdown weight bearing Other Position/Activity Restrictions: No ROM to Rt. knee Pain: Pain Assessment Pain Assessment: No/denies pain  See FIM for current functional status  Therapy/Group: Individual Therapy  Session 2 Time:  1300-1330 Pt denies pain Individual Therapy  Focus on memory, divided attention, and safety awareness.  Pt continues to require max verbal cues to adhere to weight bearing precautions during transfers.  Pt required questioning verbal cues to accurately recall morning therapy sessions and activities. Pt exhibited divided attention throughout session while in room.  TV was turned on and patient was able to attend to TV show while engaging appropriately in conversation, readily directing attention between two activities.  Pt continues to exhibit decreased safety awareness and awareness of deficits and impact on daily activities. [name] [facility] 12/19/2012, 8:00 AM
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
## 2012-12-20 ENCOUNTER — Inpatient Hospital Stay (HOSPITAL_COMMUNITY): Payer: Self-pay

## 2012-12-20 ENCOUNTER — Inpatient Hospital Stay (HOSPITAL_COMMUNITY): Payer: No Typology Code available for payment source | Admitting: *Deleted

## 2012-12-20 ENCOUNTER — Inpatient Hospital Stay (HOSPITAL_COMMUNITY): Payer: Self-pay | Admitting: Speech Pathology

## 2012-12-20 ENCOUNTER — Inpatient Hospital Stay (HOSPITAL_COMMUNITY): Payer: No Typology Code available for payment source

## 2012-12-20 DIAGNOSIS — S069X9A Unspecified intracranial injury with loss of consciousness of unspecified duration, initial encounter: Secondary | ICD-10-CM

## 2012-12-20 NOTE — Progress Notes (Signed)
Occupational Therapy Note  Patient Details  Name: DAXX TIGGS MRN: [id number] Date of Birth: 02/11/1966 Today's Date: 12/20/2012  Time:  1515-1630  (75 min)   Individual session Pain:  7/10 in right leg and left arm  Engaged in wc mobility, sit to stand, problem solving. Orientation.  Pt. Propelled wc down to apartment. Engaged in cooking activity of baking brownies.  Pt. Needed minimal cues for problem solving and maintaining WB'ing precautions.  Pt went from sit to stand with SBA.  Propelled wc in kitchen with cues to lock brakes when going from sit to stand. Gathered all ingredients with written directions and measured independently.  Pt. Recalled that we were also cutting up oranges as part of our activity.  Needed max cues to maintain proper weight bearing on RLE.        Humberto Seals 12/20/2012, 4:14 PM
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
## 2012-12-20 NOTE — Progress Notes (Signed)
Reviewed and in agreement with treatment provided.  

## 2012-12-20 NOTE — Progress Notes (Signed)
Occupational Therapy Session Note  Patient Details  Name: HADI DUBIN MRN: [id number] Date of Birth: May 05, 1966  Today's Date: 12/20/2012  Session 1 Time: 0700-0800 Time Calculation (min): 60 min  Short Term Goals: Week 2:  OT Short Term Goal 1 (Week 2): Pt will be oriented x 3 with min questiong cues OT Short Term Goal 2 (Week 2): Pt will perform basic toilet transfers with min A consistently OT Short Term Goal 3 (Week 2): Pt will recall LUE and RLE precautions with mod cuing during functional tasks OT Short Term Goal 4 (Week 2): Pt will recall and demonstrate compensatory dressing strategies with mod cuing OT Short Term Goal 5 (Week 2): Pt will demonstrate emergent awareness during functional activities with mod cuing  Skilled Therapeutic Interventions/Progress Updates:    Pt asleep upon arrival but easily aroused.  Pt required max questioning cues for orientation this morning.  Focus during BADL session on orientation, attention to tasks, safety awareness, transfers/sit<>stand while adhering to weight bearing precautions.  Pt continues to require max verbal cues for RLE and LUE weight bearing precautions. Pt continues to attempt to push up through LUE when sit<>stand.  Patient is able to carry on conversation while completing BADLs although pt's conversation remains tangential.  Therapy Documentation Precautions:  Precautions Precautions: Fall Required Braces or Orthoses: Other Brace/Splint Other Brace/Splint: short arm  cast on Lt. UE, Rt. Bledsoe brace Restrictions Weight Bearing Restrictions: Yes LUE Weight Bearing: Non weight bearing RLE Weight Bearing: Touchdown weight bearing Other Position/Activity Restrictions: No ROM to Rt. knee Pain: Pain Assessment Pain Assessment: No/denies pain  See FIM for current functional status  Therapy/Group: Individual Therapy  Session 2 Time: 1130-1200 Pt denies pain Individual Therapy  Pt seated in w/c at nursing station and  rolled to day room.  Pt presented with box of PVC piping and patient initiated emptying box and selecting diagram.  Pt constructed 4 diagrams/structures with increasing difficulty throughout session.  Pt completed all tasks while engaged appropriately in conversation with therapist.  Activity in the day room was moderately distracting but patient was able to attend to tasks without verbal cues.  [name] [facility] 12/20/2012, 8:03 AM

## 2012-12-20 NOTE — Progress Notes (Signed)
Speech Language Pathology Daily Session Note  Patient Details  Name: [name] MRN: [id number] Date of Birth: 24-Sep-1965  Today's Date: 12/20/2012 Time: 1001-1046 Time Calculation (min): 45 min  Short Term Goals: Week 3: SLP Short Term Goal 1 (Week 3): Pt will maintain topic of conversation for 5 turns with Mod A mutlimodal cueing SLP Short Term Goal 2 (Week 3): Pt will recall safety precautions with Max A multimodal cueing. SLP Short Term Goal 3 (Week 3): Pt will demonstrate selective attention to a functional task for 15 minutes with Mod A verbal cues for redirection  SLP Short Term Goal 4 (Week 3): Pt will orient to time, place and situation with Min A multimodal cues.  SLP Short Term Goal 5 (Week 3): Pt will demonstrate functional problem solving for basic and familiar tasks with Min A multimodal cueing.  SLP Short Term Goal 6 (Week 3): Pt will identify 1 physical and 1 cognitive deficit with Max A question and visual cues.   Skilled Therapeutic Interventions: Skilled treatment focused on cognitive goals. SLP facilitated session with descriptive game in minimally distracting environment. Pt demonstrated intellectual awareness of physical deficits with Min visual and question cues, as well as basic problem solving of functional task with supervision level cues. Pt required Mod verbal and question cues for redirection with distractors. He required Max verbal and written cues to recall the three types of therapy that he is participating in. Continue plan of care.   FIM:  Comprehension Comprehension Mode: Auditory Comprehension: 5-Understands basic 90% of the time/requires cueing < 10% of the time Expression Expression Mode: Verbal Expression: 5-Expresses basic 90% of the time/requires cueing < 10% of the time. Social Interaction Social Interaction: 4-Interacts appropriately 75 - 89% of the time - Needs redirection for appropriate language or to initiate interaction. Problem  Solving Problem Solving: 3-Solves basic 50 - 74% of the time/requires cueing 25 - 49% of the time Memory Memory: 3-Recognizes or recalls 50 - 74% of the time/requires cueing 25 - 49% of the time FIM - Eating Eating Activity: 6: More than reasonable amount of time  Pain Pain Assessment Pain Assessment: No/denies pain Pain Score: 3  Pain Type: Acute pain Pain Location: Flank Pain Orientation: Lower;Left Pain Onset: With Activity Pain Intervention(s): Medication (See eMAR)  Therapy/Group: Individual Therapy  Maxcine Ham 12/20/2012, 11:55 AM  Maxcine Ham, M.A. CCC-SLP

## 2012-12-20 NOTE — Plan of Care (Signed)
Problem: RH PAIN MANAGEMENT Goal: RH STG PAIN MANAGED AT OR BELOW PT'S PAIN GOAL Pain level less than 6  Outcome: Not Progressing Patient rate pain at 7. adm

## 2012-12-20 NOTE — Progress Notes (Signed)
Physical Therapy Session Note  Patient Details  Name: [name] MRN: [id number] Date of Birth: 20-Jun-1965  Today's Date: 12/20/2012 Time: 0800-0833 Time Calculation (min): 33 min   Skilled Therapeutic Interventions/Progress Updates:    Therapy focused on w/c propulsion, standing dynamic balance, and gait training. Pt performed w/c propulsion for 150 ft with S for cardiovascular endurance and UE strengthening. Pt performed standing dynamic balance activity while using L PFRW that consisted of shooting a basketball with close S therefore activity was progressed to having the pt standing on foam while performing dynamic standing balance challenge with min A and VC's to not bear weight through R LE. Pt performed gait training using L PFRW for ~50 ft with close S, VC's, and tactile cues for maintaining WB status with the use of theraband attachment to L PFRW. Pt with c/o 4/10 pain in the R knee area at the end of therapy session therefore nursing staff notified and pain medication requested.   Therapy Documentation Precautions:  Precautions Precautions: Fall Required Braces or Orthoses: Other Brace/Splint Other Brace/Splint: short arm  cast on Lt. UE, Rt. Bledsoe brace Restrictions Weight Bearing Restrictions: Yes LUE Weight Bearing: Non weight bearing RLE Weight Bearing: Touchdown weight bearing Other Position/Activity Restrictions: No ROM to Rt. knee  See FIM for current functional status  Therapy/Group: Individual Therapy  [place], Jahmani Staup 12/20/2012, 8:54 AM

## 2012-12-20 NOTE — Progress Notes (Signed)
Patient ID: [name], male   DOB: 09-Sep-1965, 47 y.o.   MRN: [id number] Subjective/Complaints: No new problems. Eating breakfast already Review of Systems otherwise negative     Objective: Vital Signs: Blood pressure 96/46, pulse 92, temperature 98.2 F (36.8 C), temperature source Oral, resp. rate 19, height 5\' 7"  (1.702 m), weight 79 kg (174 lb 2.6 oz), SpO2 98.00%. No results found. Results for orders placed during the hospital encounter of 12/05/12 (from the past 72 hour(s))  CREATININE, SERUM     Status: Abnormal   Collection Time    12/19/12 12:22 PM      Result Value Range   Creatinine, Ser 1.00  0.50 - 1.35 mg/dL   GFR calc non Af Amer 89 (*) >90 mL/min   GFR calc Af Amer >90  >90 mL/min   Comment:            The eGFR has been calculated     using the CKD EPI equation.     This calculation has not been     validated in all clinical     situations.     eGFR's persistently     <90 mL/min signify     possible Chronic Kidney Disease.     Vitals reviewed.  HENT:  Head: Normocephalic.  Eyes:  Pupils reactive to light.  Neck: Normal range of motion. Neck supple. No thyromegaly present.  Cardiovascular: Normal rate and regular rhythm.  Pulmonary/Chest: Effort normal and breath sounds normal. No respiratory distress.  Abdominal: Soft. Bowel sounds are normal. He exhibits no distension.  Musculoskeletal:  Left thumb spica splint/cast in place  Neurological:   . He needed multiple cues for place and situation. .  motor strength limited exam secondary to left thumb spica cast and right thumb spica splint and right knee immobilizer.  5/5 bilateral deltoid bicep triceps and left hip flexor knee extensor ankle dorsiflexor plantar flexor  Right ankle dorsiflexor plantar flexor 3+/5. Sensation intact to light touch in left lower extremities, right lower seems decreased in relation to light touch. intact upper extremities difficult to test secondary to his splints   Patient is less distractible. Better insight and awareness.  Skin: clean and intact right thigh wound. S  Assessment/Plan: 1. 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 Physiatrist is providing close team supervision and 24 hour management of active medical problems listed below. Physiatrist and rehab team continue to assess barriers to discharge/monitor patient progress toward functional and medical goals.  More awareness and insight noted.  Still needs supervision for safety. Placement pending  FIM: FIM - Bathing Bathing Steps Patient Completed: Chest;Right Arm;Left Arm;Abdomen;Front perineal area;Buttocks;Right upper leg;Left upper leg;Left lower leg (including foot);Right lower leg (including foot) Bathing: 4: Steadying assist  FIM - Upper Body Dressing/Undressing Upper body dressing/undressing steps patient completed: Thread/unthread right sleeve of pullover shirt/dresss;Thread/unthread left sleeve of pullover shirt/dress;Put head through opening of pull over shirt/dress;Pull shirt over trunk Upper body dressing/undressing: 5: Set-up assist to: Obtain clothing/put away FIM - Lower Body Dressing/Undressing Lower body dressing/undressing steps patient completed: Thread/unthread left underwear leg;Thread/unthread right underwear leg;Pull underwear up/down;Thread/unthread right pants leg;Thread/unthread left pants leg;Pull pants up/down;Don/Doff left shoe;Fasten/unfasten pants;Don/Doff left sock Lower body dressing/undressing: 4: Min-Patient completed 75 plus % of tasks  FIM - Toileting Toileting steps completed by patient: Adjust clothing prior to toileting;Performs perineal hygiene;Adjust clothing after toileting Toileting Assistive  Devices: Grab bar or rail for support Toileting: 4: Steadying assist  FIM - Ambulance person Devices: Elevated toilet seat;Grab bars Toilet Transfers: 4-To toilet/BSC: Min A (steadying Pt. > 75%);4-From toilet/BSC: Min A (steadying Pt. > 75%)  FIM - Bed/Chair Transfer Bed/Chair Transfer Assistive Devices: Bed rails;HOB elevated;Arm rests Bed/Chair Transfer: 5: Sit > Supine: Supervision (verbal cues/safety issues);5: Supine > Sit: Supervision (verbal cues/safety issues);4: Bed > Chair or W/C: Min A (steadying Pt. > 75%);4: Chair or W/C > Bed: Min A (steadying Pt. > 75%)  FIM - Locomotion: Wheelchair Distance: 150 Locomotion: Wheelchair: 5: Travels 150 ft or more: maneuvers on rugs and over door sills with supervision, cueing or coaxing FIM - Locomotion: Ambulation Locomotion: Ambulation Assistive Devices: TEFL teacher Ambulation/Gait Assistance: 4: Min guard Locomotion: Ambulation: 4: Travels 150 ft or more with minimal assistance (Pt.>75%)  Comprehension Comprehension Mode: Auditory Comprehension: 5-Understands basic 90% of the time/requires cueing < 10% of the time  Expression Expression Mode: Verbal Expression: 5-Expresses complex 90% of the time/cues < 10% of the time  Social Interaction Social Interaction: 4-Interacts appropriately 75 - 89% of the time - Needs redirection for appropriate language or to initiate interaction.  Problem Solving Problem Solving: 4-Solves basic 75 - 89% of the time/requires cueing 10 - 24% of the time  Memory Memory: 3-Recognizes or recalls 50 - 74% of the time/requires cueing 25 - 49% of the time Medical Problem List and Plan:  1. Traumatic brain injury/ multitrauma  2. DVT Prophylaxis/Anticoagulation: Subcutaneous Lovenox. Monitor platelet counts any signs of bleeding  3. Pain Management: Ultram decreased to 50 mg every 6 hours (sedation) and oxycodone as needed for breakthrough pain. Tolerating therapy activities at present. Pain control adequate 4. Neuropsych: This patient is not capable of  making decisions on his own behalf.  5. Multiple facial fractures. Status post ORIF of right tripod fracture 12/04/2012    6. Right femur and acetabular as well as patella fracture. Status post ORIF right transverse anterior column with removal of broken hardware and intramedullary nail right femur 11/29/2012. Patient is touchdown weightbearing   -working on adherence to precautions 7. Open fracture dislocation, left thumb MCP joint status post ORIF and pinning of fracture with ulnar collateral ligament repair reconstruction of left from 11/29/2012. Patient is nonweightbearing --sutures, cast changed thursday 8. Acute blood loss anemia. Latest hemoglobin up to 8.7 9. History of alcohol and drug abuse. Urine drug screen positive for cocaine.  Provide counseling  10. Lethargy: resolved        LOS (Days) 15 A FACE TO FACE EVALUATION WAS PERFORMED  SWARTZ,ZACHARY T 12/20/2012, 7:51 AM

## 2012-12-21 ENCOUNTER — Inpatient Hospital Stay (HOSPITAL_COMMUNITY): Payer: No Typology Code available for payment source | Admitting: Physical Therapy

## 2012-12-21 ENCOUNTER — Inpatient Hospital Stay (HOSPITAL_COMMUNITY): Payer: Self-pay | Admitting: Occupational Therapy

## 2012-12-21 DIAGNOSIS — S069X9A Unspecified intracranial injury with loss of consciousness of unspecified duration, initial encounter: Secondary | ICD-10-CM

## 2012-12-21 LAB — CBC
MCH: 28.6 pg (ref 26.0–34.0)
MCV: 85.8 fL (ref 78.0–100.0)
Platelets: 429 10*3/uL — ABNORMAL HIGH (ref 150–400)
RDW: 14.3 % (ref 11.5–15.5)

## 2012-12-21 LAB — URINALYSIS, ROUTINE W REFLEX MICROSCOPIC
Bilirubin Urine: NEGATIVE
Hgb urine dipstick: NEGATIVE
Ketones, ur: NEGATIVE mg/dL
Protein, ur: NEGATIVE mg/dL
Urobilinogen, UA: 0.2 mg/dL (ref 0.0–1.0)

## 2012-12-21 NOTE — Plan of Care (Signed)
Problem: RH PAIN MANAGEMENT Goal: RH STG PAIN MANAGED AT OR BELOW PT'S PAIN GOAL Pain level less than 6  Outcome: Progressing Patient rate pain at 7

## 2012-12-21 NOTE — Progress Notes (Signed)
Patient ID: [name], male   DOB: Dec 13, 1965, 47 y.o.   MRN: [id number] Subjective/Complaints: No new problems. Review of Systems otherwise negative     Objective: Vital Signs: Blood pressure 92/64, pulse 110, temperature 98.8 F (37.1 C), temperature source Oral, resp. rate 20, height 5\' 7"  (1.702 m), weight 79 kg (174 lb 2.6 oz), SpO2 99.00%. No results found. Results for orders placed during the hospital encounter of 12/05/12 (from the past 72 hour(s))  CREATININE, SERUM     Status: Abnormal   Collection Time    12/19/12 12:22 PM      Result Value Range   Creatinine, Ser 1.00  0.50 - 1.35 mg/dL   GFR calc non Af Amer 89 (*) >90 mL/min   GFR calc Af Amer >90  >90 mL/min   Comment:            The eGFR has been calculated     using the CKD EPI equation.     This calculation has not been     validated in all clinical     situations.     eGFR's persistently     <90 mL/min signify     possible Chronic Kidney Disease.  CBC     Status: Abnormal   Collection Time    12/21/12  9:53 AM      Result Value Range   WBC 6.8  4.0 - 10.5 K/uL   RBC 3.18 (*) 4.22 - 5.81 MIL/uL   Hemoglobin 9.1 (*) 13.0 - 17.0 g/dL   HCT 40.9 (*) 81.1 - 91.4 %   MCV 85.8  78.0 - 100.0 fL   MCH 28.6  26.0 - 34.0 pg   MCHC 33.3  30.0 - 36.0 g/dL   RDW 78.2  95.6 - 21.3 %   Platelets 429 (*) 150 - 400 K/uL     Vitals reviewed.  HENT:  Head: Normocephalic.  Eyes:  Pupils reactive to light.  Neck: Normal range of motion. Neck supple. No thyromegaly present.  Cardiovascular: Normal rate and regular rhythm.  Pulmonary/Chest: Effort normal and breath sounds normal. No respiratory distress.  Abdominal: Soft. Bowel sounds are normal. He exhibits no distension.  Musculoskeletal:  Left thumb spica splint/cast in place  Neurological:   . He needed multiple cues for place and situation. .  motor strength limited exam secondary to left thumb spica cast and right thumb spica splint and right knee  immobilizer.  5/5 bilateral deltoid bicep triceps and left hip flexor knee extensor ankle dorsiflexor plantar flexor  Right ankle dorsiflexor plantar flexor 3+/5. Sensation intact to light touch in left lower extremities, right lower seems decreased in relation to light touch. intact upper extremities difficult to test secondary to his splints  Patient is less distractible. Better insight and awareness.  Skin: clean and intact right thigh wound. S  Assessment/Plan: 1. 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 Physiatrist is providing close team supervision and 24 hour management of active medical problems listed below. Physiatrist and rehab team continue to assess barriers to discharge/monitor patient progress toward functional and medical goals.  More awareness and insight noted.  Still needs supervision for safety. Placement pending  FIM: FIM - Bathing Bathing Steps Patient Completed: Chest;Right Arm;Left Arm;Abdomen;Front perineal area;Buttocks;Right upper leg;Left upper leg;Left lower leg (including foot);Right lower leg (including foot) Bathing: 4: Steadying assist  FIM -  Upper Body Dressing/Undressing Upper body dressing/undressing steps patient completed: Thread/unthread right sleeve of pullover shirt/dresss;Thread/unthread left sleeve of pullover shirt/dress;Put head through opening of pull over shirt/dress;Pull shirt over trunk Upper body dressing/undressing: 5: Set-up assist to: Obtain clothing/put away FIM - Lower Body Dressing/Undressing Lower body dressing/undressing steps patient completed: Thread/unthread left underwear leg;Thread/unthread right underwear leg;Pull underwear up/down;Thread/unthread right pants leg;Thread/unthread left pants leg;Pull pants up/down;Don/Doff left shoe;Fasten/unfasten pants;Don/Doff  left sock Lower body dressing/undressing: 4: Min-Patient completed 75 plus % of tasks  FIM - Toileting Toileting steps completed by patient: Adjust clothing prior to toileting;Performs perineal hygiene;Adjust clothing after toileting Toileting Assistive Devices: Grab bar or rail for support Toileting: 4: Steadying assist  FIM - Diplomatic Services operational officer Devices: Elevated toilet seat;Grab bars Toilet Transfers: 4-To toilet/BSC: Min A (steadying Pt. > 75%);4-From toilet/BSC: Min A (steadying Pt. > 75%)  FIM - Bed/Chair Transfer Bed/Chair Transfer Assistive Devices: Arm rests Bed/Chair Transfer: 5: Supine > Sit: Supervision (verbal cues/safety issues);5: Sit > Supine: Supervision (verbal cues/safety issues);5: Bed > Chair or W/C: Supervision (verbal cues/safety issues);5: Chair or W/C > Bed: Supervision (verbal cues/safety issues)  FIM - Locomotion: Wheelchair Distance: 150 Locomotion: Wheelchair: 5: Travels 150 ft or more: maneuvers on rugs and over door sills with supervision, cueing or coaxing FIM - Locomotion: Ambulation Locomotion: Ambulation Assistive Devices: TEFL teacher Ambulation/Gait Assistance: 5: Supervision Locomotion: Ambulation: 1: Travels less than 50 ft with supervision/safety issues  Comprehension Comprehension Mode: Auditory Comprehension: 5-Understands basic 90% of the time/requires cueing < 10% of the time  Expression Expression Mode: Verbal Expression: 2-Expresses basic 25 - 49% of the time/requires cueing 50 - 75% of the time. Uses single words/gestures.  Social Interaction Social Interaction: 4-Interacts appropriately 75 - 89% of the time - Needs redirection for appropriate language or to initiate interaction.  Problem Solving Problem Solving: 3-Solves basic 50 - 74% of the time/requires cueing 25 - 49% of the time  Memory Memory: 3-Recognizes or recalls 50 - 74% of the time/requires cueing 25 - 49% of the time Medical Problem List  and Plan:  1. Traumatic brain injury/ multitrauma  2. DVT Prophylaxis/Anticoagulation: Subcutaneous Lovenox. Monitor platelet counts any signs of bleeding  3. Pain Management: Ultram decreased to 50 mg every 6 hours (sedation) and oxycodone as needed for breakthrough pain. Tolerating therapy activities at present. Pain control adequate 4. Neuropsych: This patient is not capable of making decisions on his own behalf.  5. Multiple facial fractures. Status post ORIF of right tripod fracture 12/04/2012    6. Right femur and acetabular as well as patella fracture. Status post ORIF right transverse anterior column with removal of broken hardware and intramedullary nail right femur 11/29/2012. Patient is touchdown weightbearing   -working on adherence to precautions 7. Open fracture dislocation, left thumb MCP joint status post ORIF and pinning of fracture with ulnar collateral ligament repair reconstruction of left from 11/29/2012. Patient is nonweightbearing --sutures, cast changed thursday 8. Acute blood loss anemia. Latest hemoglobin up to 8.7 9. History of alcohol and drug abuse. Urine drug screen positive for cocaine.  Provide counseling  10. Lethargy: resolved   11.  Left thumb numbness likely superficial radial nerve injury 12.  R lateral thigh numb, likely R Lateral femoral Cut nerve injury     LOS (Days) 16 A FACE TO FACE EVALUATION WAS PERFORMED  KIRSTEINS,ANDREW E 12/21/2012, 10:31 AM

## 2012-12-21 NOTE — Progress Notes (Addendum)
Occupational Therapy Session Note  Patient Details  Name: [name] MRN: [id number] Date of Birth: September 20, 1965  Today's Date: 12/21/2012 Time: 4098-1191 Time Calculation (min): 30 min  Short Term Goals: Week 2:  OT Short Term Goal 1 (Week 2): Pt will be oriented x 3 with min questiong cues OT Short Term Goal 2 (Week 2): Pt will perform basic toilet transfers with min A consistently OT Short Term Goal 3 (Week 2): Pt will recall LUE and RLE precautions with mod cuing during functional tasks OT Short Term Goal 4 (Week 2): Pt will recall and demonstrate compensatory dressing strategies with mod cuing OT Short Term Goal 5 (Week 2): Pt will demonstrate emergent awareness during functional activities with mod cuing  Skilled Therapeutic Interventions/Progress Updates:    1:1 perform functional ambulation with platform RW to bathroom for toilet transfer and toileting. Pt able to perform with close supervision for transfers and min A for ambulation with mod VC to maintain weight bearing precautions. Pt is tempted to put weight through right UE with clothing management. Ambulated to sink to perform hand hygiene with setup and VC. Transitioned into w/c and propelled self to dayroom with left LE. Perform LE exercises at pt's request with focus on pt recalling previous exercises in sessions, keeping track of reps and intellectual and emergent awareness of healing process of right LE. Performed hip abduction/ adduction, straight leg raises, and ankle pumps.  Pt with good questions about weight bearing restrictions and how long he would have to maintain them. Engaged in game of SPOT IT with focus on maintaining selective attention in a minimal distracting environment, recall of rules of game, turn taking, simple problem solving and processing of requested information.  Therapy Documentation Precautions:  Precautions Precautions: Fall Required Braces or Orthoses: Other Brace/Splint Other Brace/Splint:  short arm  cast on Lt. UE, Rt. Bledsoe brace Restrictions Weight Bearing Restrictions: Yes LUE Weight Bearing: Non weight bearing RLE Weight Bearing: Touchdown weight bearing Other Position/Activity Restrictions: No ROM to Rt. knee Pain: C/o HA and neck ache; rated pain 6-7. Rn made aware and brought meds See FIM for current functional status  Therapy/Group: Individual Therapy  [name] [facility] 12/21/2012, 3:25 PM

## 2012-12-21 NOTE — Progress Notes (Signed)
Physical Therapy Note  Patient Details  Name: MATHIUS BIRKELAND MRN: [id number] Date of Birth: [birth year] Today's Date: 12/21/2012  1100-1125 (25 minutes) individual Pain: 6/10 RT LE/premedicated Precautions: TDWB RT LE, NWB LT UE Focus of treatment: Rt LE strengthening within Bledsoe brace to improve functional mobility Treatment: transfer SBA LT PFRW using green theraband sling Rt LE to maintain weight bearing status; gait 80 feet SBA as above ; AA RT hip flexion using green theraband to improve sit >< supine (4 X 10) . Quick release belt applied in wc.    Ayuub Penley,JIM 12/21/2012, 11:28 AM

## 2012-12-22 ENCOUNTER — Inpatient Hospital Stay (HOSPITAL_COMMUNITY): Payer: No Typology Code available for payment source | Admitting: *Deleted

## 2012-12-22 NOTE — Progress Notes (Signed)
Occupational Therapy Session Note  Patient Details  Name: [name] MRN: [id number] Date of Birth: 1966/02/11  Today's Date: 12/22/2012 Time:  -   1045-1200  (75 min)                      Short Term Goals: Week 2:  OT Short Term Goal 1 (Week 2): Pt will be oriented x 3 with min questiong cues OT Short Term Goal 2 (Week 2): Pt will perform basic toilet transfers with min A consistently OT Short Term Goal 3 (Week 2): Pt will recall LUE and RLE precautions with mod cuing during functional tasks OT Short Term Goal 4 (Week 2): Pt will recall and demonstrate compensatory dressing strategies with mod cuing OT Short Term Goal 5 (Week 2): Pt will demonstrate emergent awareness during functional activities with mod cuing      Skilled Therapeutic Interventions/Progress Updates:    Pt. Engaged in therapeutic bathing and dressing at bed/sink level.  He doffed right leg brace independently and bathed leg/foot after set up.  Transferred to wc to complete the rest of his bath with mod cues to maintain weight bearing restrictions.  Pt. Able to attend to dressing and keep track of tv show (divided attention).  Pt. Transferred back to bed with close supervision and did well maintaining WB precautions. Went over some LE exercises per pt's request with ankle and holding LLE in isometric contraction.   Left in bed with food tray set up and call bell.    Therapy Documentation Precautions:  Precautions Precautions: Fall Required Braces or Orthoses: Other Brace/Splint Other Brace/Splint: short arm  cast on Lt. UE, Rt. Bledsoe brace Restrictions Weight Bearing Restrictions: Yes LUE Weight Bearing: Non weight bearing RLE Weight Bearing: Touchdown weight bearing Other Position/Activity Restrictions: No ROM to Rt. knee       Pain: Pain Assessment Pain Assessment: No/denies pain Pain Score: 0-No pain         See FIM for current functional status  Therapy/Group: Individual Therapy  Humberto Seals 12/22/2012, 12:43 PM

## 2012-12-22 NOTE — Progress Notes (Addendum)
Patient ID: [name], male   DOB: 1965-12-10, 47 y.o.   MRN: [id number] Subjective/Complaints: Pt asking questions regarding fractures in R hip area Pain well controlled  Review of Systems otherwise negative     Objective: Vital Signs: Blood pressure 97/62, pulse 62, temperature 98.6 F (37 C), temperature source Oral, resp. rate 17, height 5\' 7"  (1.702 m), weight 79 kg (174 lb 2.6 oz), SpO2 100.00%. No results found. Results for orders placed during the hospital encounter of 12/05/12 (from the past 72 hour(s))  CREATININE, SERUM     Status: Abnormal   Collection Time    12/19/12 12:22 PM      Result Value Range   Creatinine, Ser 1.00  0.50 - 1.35 mg/dL   GFR calc non Af Amer 89 (*) >90 mL/min   GFR calc Af Amer >90  >90 mL/min   Comment:            The eGFR has been calculated     using the CKD EPI equation.     This calculation has not been     validated in all clinical     situations.     eGFR's persistently     <90 mL/min signify     possible Chronic Kidney Disease.  CBC     Status: Abnormal   Collection Time    12/21/12  9:53 AM      Result Value Range   WBC 6.8  4.0 - 10.5 K/uL   RBC 3.18 (*) 4.22 - 5.81 MIL/uL   Hemoglobin 9.1 (*) 13.0 - 17.0 g/dL   HCT 40.9 (*) 81.1 - 91.4 %   MCV 85.8  78.0 - 100.0 fL   MCH 28.6  26.0 - 34.0 pg   MCHC 33.3  30.0 - 36.0 g/dL   RDW 78.2  95.6 - 21.3 %   Platelets 429 (*) 150 - 400 K/uL  URINALYSIS, ROUTINE W REFLEX MICROSCOPIC     Status: Abnormal   Collection Time    12/21/12  5:44 PM      Result Value Range   Color, Urine YELLOW  YELLOW   APPearance CLOUDY (*) CLEAR   Specific Gravity, Urine 1.019  1.005 - 1.030   pH 7.5  5.0 - 8.0   Glucose, UA NEGATIVE  NEGATIVE mg/dL   Hgb urine dipstick NEGATIVE  NEGATIVE   Bilirubin Urine NEGATIVE  NEGATIVE   Ketones, ur NEGATIVE  NEGATIVE mg/dL   Protein, ur NEGATIVE  NEGATIVE mg/dL   Urobilinogen, UA 0.2  0.0 - 1.0 mg/dL   Nitrite NEGATIVE  NEGATIVE   Leukocytes, UA  NEGATIVE  NEGATIVE   Comment: MICROSCOPIC NOT DONE ON URINES WITH NEGATIVE PROTEIN, BLOOD, LEUKOCYTES, NITRITE, OR GLUCOSE <1000 mg/dL.     Vitals reviewed.  HENT:  Head: Normocephalic.  Eyes:  Pupils reactive to light.  Neck: Normal range of motion. Neck supple. No thyromegaly present.  Cardiovascular: Normal rate and regular rhythm.  Pulmonary/Chest: Effort normal and breath sounds normal. No respiratory distress.  Abdominal: Soft. Bowel sounds are normal. He exhibits no distension.  Musculoskeletal:  Left thumb spica splint/cast in place  Neurological:   . He needed multiple cues for place and situation. .  motor strength limited exam secondary to left thumb spica cast and right thumb spica splint and right knee immobilizer.  5/5 bilateral deltoid bicep triceps and left hip flexor knee extensor ankle dorsiflexor plantar flexor  Right ankle dorsiflexor plantar flexor 3+/5. Sensation intact to light touch in  left lower extremities, Decreased sensory R Lat thigh and Left dital thumbPatient is less distractible. Better insight and awareness.  Skin: clean and intact right thigh wound.   Assessment/Plan: 1. 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 Physiatrist is providing close team supervision and 24 hour management of active medical problems listed below. Physiatrist and rehab team continue to assess barriers to discharge/monitor patient progress toward functional and medical goals.  More awareness and insight noted.  Still needs supervision for safety. Placement pending  FIM: FIM - Bathing Bathing Steps Patient Completed:  (refused) Bathing: 4: Steadying assist  FIM - Upper Body Dressing/Undressing Upper body dressing/undressing steps patient completed: Thread/unthread right sleeve of pullover  shirt/dresss;Thread/unthread left sleeve of pullover shirt/dress;Put head through opening of pull over shirt/dress;Pull shirt over trunk Upper body dressing/undressing: 5: Set-up assist to: Obtain clothing/put away FIM - Lower Body Dressing/Undressing Lower body dressing/undressing steps patient completed: Thread/unthread left underwear leg;Thread/unthread right underwear leg;Pull underwear up/down;Thread/unthread right pants leg;Thread/unthread left pants leg;Pull pants up/down;Don/Doff left shoe;Fasten/unfasten pants;Don/Doff left sock Lower body dressing/undressing: 4: Min-Patient completed 75 plus % of tasks  FIM - Toileting Toileting steps completed by patient: Adjust clothing prior to toileting;Performs perineal hygiene;Adjust clothing after toileting Toileting Assistive Devices: Grab bar or rail for support Toileting: 4: Steadying assist  FIM - Diplomatic Services operational officer Devices: Elevated toilet seat;Grab bars Toilet Transfers: 4-To toilet/BSC: Min A (steadying Pt. > 75%);4-From toilet/BSC: Min A (steadying Pt. > 75%)  FIM - Bed/Chair Transfer Bed/Chair Transfer Assistive Devices: Arm rests Bed/Chair Transfer: 5: Supine > Sit: Supervision (verbal cues/safety issues);5: Sit > Supine: Supervision (verbal cues/safety issues);5: Bed > Chair or W/C: Supervision (verbal cues/safety issues);5: Chair or W/C > Bed: Supervision (verbal cues/safety issues)  FIM - Locomotion: Wheelchair Distance: 150 Locomotion: Wheelchair: 5: Travels 150 ft or more: maneuvers on rugs and over door sills with supervision, cueing or coaxing FIM - Locomotion: Ambulation Locomotion: Ambulation Assistive Devices: TEFL teacher Ambulation/Gait Assistance: 5: Supervision Locomotion: Ambulation: 1: Travels less than 50 ft with supervision/safety issues  Comprehension Comprehension Mode: Auditory Comprehension: 5-Understands basic 90% of the time/requires cueing < 10% of the  time  Expression Expression Mode: Verbal Expression: 2-Expresses basic 25 - 49% of the time/requires cueing 50 - 75% of the time. Uses single words/gestures.  Social Interaction Social Interaction: 4-Interacts appropriately 75 - 89% of the time - Needs redirection for appropriate language or to initiate interaction.  Problem Solving Problem Solving: 3-Solves basic 50 - 74% of the time/requires cueing 25 - 49% of the time  Memory Memory: 3-Recognizes or recalls 50 - 74% of the time/requires cueing 25 - 49% of the time Medical Problem List and Plan:  1. Traumatic brain injury/ multitrauma  2. DVT Prophylaxis/Anticoagulation: Subcutaneous Lovenox. Monitor platelet counts any signs of bleeding  3. Pain Management: Ultram decreased to 50 mg every 6 hours (sedation) and oxycodone as needed for breakthrough pain. Tolerating therapy activities at present. Pain control adequate 4. Neuropsych: This patient is not capable of making decisions on his own behalf.  5. Multiple facial fractures. Status post ORIF of right tripod fracture 12/04/2012    6. Right femur and acetabular as well as patella fracture. Status post ORIF right transverse anterior column with removal of broken hardware and intramedullary nail right femur 11/29/2012. Patient is touchdown weightbearing   -working on adherence to precautions 7. Open fracture  dislocation, left thumb MCP joint status post ORIF and pinning of fracture with ulnar collateral ligament repair reconstruction of left from 11/29/2012. Patient is nonweightbearing --sutures, cast changed thursday 8. Acute blood loss anemia. Latest hemoglobin up to 9.1 9. History of alcohol and drug abuse. Urine drug screen positive for cocaine.  Provide counseling  10. Lethargy: resolved   11.  Left thumb numbness likely superficial radial nerve injury 12.  R lateral thigh numb, likely R Lateral femoral Cut nerve injury     LOS (Days) 17 A FACE TO FACE EVALUATION WAS  PERFORMED  Eria Lozoya E 12/22/2012, 9:17 AM

## 2012-12-23 ENCOUNTER — Inpatient Hospital Stay (HOSPITAL_COMMUNITY): Payer: Self-pay | Admitting: Occupational Therapy

## 2012-12-23 ENCOUNTER — Inpatient Hospital Stay (HOSPITAL_COMMUNITY): Payer: No Typology Code available for payment source

## 2012-12-23 ENCOUNTER — Inpatient Hospital Stay (HOSPITAL_COMMUNITY): Payer: No Typology Code available for payment source | Admitting: Physical Therapy

## 2012-12-23 DIAGNOSIS — S069X9A Unspecified intracranial injury with loss of consciousness of unspecified duration, initial encounter: Secondary | ICD-10-CM

## 2012-12-23 NOTE — Progress Notes (Signed)
Physical Therapy Session Note  Patient Details  Name: [name] MRN: [id number] Date of Birth: 09/23/1965  Today's Date: 12/23/2012 Time: 6578-4696 Time Calculation (min): 55 min  Skilled Therapeutic Interventions/Progress Updates:    Sister concerned about pt's bloody stools, RN made aware. Collected bowel sample. Stand pivot transfer with railing to toilet with min-guard assist max cues for maintaining precautions, pt really needs to use device with doing bathroom mobility as he does not adhere to precautions when foot not in theraband sling. Gait around community setting (gift shop) negotiating obstacles and completing tight turns with min-guard assist over carpet while cognitively distracted by calculations task and by discussion from surrounding customers. Completed simulated money management with simple math needing mod to max assist. Mod to max verbal cues for sustained attention to task. Pt avoiding answering math questions at times to avoid admitting he has difficulty with this task.    Therapy Documentation Precautions:  Precautions Precautions: Fall Required Braces or Orthoses: Other Brace/Splint Other Brace/Splint: short arm  cast on Lt. UE, Rt. Bledsoe brace Restrictions Weight Bearing Restrictions: Yes LUE Weight Bearing: Non weight bearing RLE Weight Bearing: Touchdown weight bearing Other Position/Activity Restrictions: No ROM to Rt. knee Pain: No c/o pain  See FIM for current functional status  Therapy/Group: Individual Therapy  [name] 12/23/2012, 12:18 PM

## 2012-12-23 NOTE — Progress Notes (Signed)
Physical Therapy Session Note  Patient Details  Name: [name] MRN: [id number] Date of Birth: 06/27/1965  Today's Date: 12/23/2012 Time: 1104-1200 Time Calculation (min): 56 min   Skilled Therapeutic Interventions/Progress Updates:   1:1 Focus this tx session on pathfinding in w/c, amb on uneven surfaces and dynamic standing balance. Pt able to direct therapist to gift shop on lower level of hospital w/ only one correction needed regarding the appropriate floor level. Pt accurate w/ all other directions. Pt req min A-min guard during w/c propulsion w/ L LE in busy environments due to increased level of distractability. Pt able to amb 100' w/ min-guard to close (S), L PFRW and R LE supported by theraband to maintain NWB status. Pt req 1 standing rest break. Pt participated in bean bag toss with L LE standing on compliant floor mat w/ L UE supported by PFRW and tossing w/ R UE. Pt req close(S) to min-guard. Pt demonstrated slightly increase in ability to recall importance of maintaining NWB R LE, but req max cues for complete understanding.   Therapy Documentation Precautions:  Precautions Precautions: Fall Required Braces or Orthoses: Other Brace/Splint Other Brace/Splint: short arm  cast on Lt. UE, Rt. Bledsoe brace Restrictions Weight Bearing Restrictions: Yes LUE Weight Bearing: Non weight bearing RLE Weight Bearing: Touchdown weight bearing Other Position/Activity Restrictions: No ROM to Rt. knee    Pain: Pain Assessment Pain Assessment: 0-10 Pain Score: 8  Pain Intervention(s): RN made aware (Pt did not want to request meds from nurse at this time)  See FIM for current functional status  Therapy/Group: Individual Therapy  Denzil Hughes 12/23/2012, 12:06 PM

## 2012-12-23 NOTE — Progress Notes (Signed)
Occupational Therapy Session Note  Patient Details  Name: [name] MRN: [id number] Date of Birth: 08-Sep-1965  Today's Date: 12/23/2012 Time: 0700-0800 Time Calculation (min): 60 min  Short Term Goals: Week 3:  OT Short Term Goal 1 (Week 3): Pt will perform toilet transfers with supervision OT Short Term Goal 2 (Week 3): Pt will perform bathing with supervision OT Short Term Goal 3 (Week 3): Pt will perform LB dressing with min A OT Short Term Goal 4 (Week 3): Pt will demonstrate emergent awareness with min verbal cues  Skilled Therapeutic Interventions/Progress Updates:    Pt asleep in bed upon arrival but easily aroused.  Pt engaged in bathing and dressing with sit<>stand from w/c.  Focus on emergent awareness, attention, safety awareness, dynamic standing balance, and transfers.  Pt requires min verbal cues for RLE weight bearing precautions and mod verbal cues for LUE weight bearing precautions. Pt can verbalize weight bearing precautions but requires verbal cues during functional tasks.  Pt requires max verbal cues for Rt knee ROM restrictions.    Therapy Documentation Precautions:  Precautions Precautions: Fall Required Braces or Orthoses: Other Brace/Splint Other Brace/Splint: short arm  cast on Lt. UE, Rt. Bledsoe brace Restrictions Weight Bearing Restrictions: Yes LUE Weight Bearing: Non weight bearing RLE Weight Bearing: Touchdown weight bearing Other Position/Activity Restrictions: No ROM to Rt. knee Pain: Pain Assessment Pain Assessment: No/denies pain Pain Score: 7  Pain Type: Acute pain Pain Location: Leg Pain Orientation: Right Pain Descriptors / Indicators: Aching Pain Frequency: Intermittent Pain Onset: Gradual Pain Intervention(s): Medication (See eMAR)  See FIM for current functional status  Therapy/Group: Individual Therapy  Rich Brave 12/23/2012, 8:05 AM

## 2012-12-23 NOTE — Progress Notes (Signed)
Speech Language Pathology Daily Session Note  Patient Details  Name: [name] MRN: [id number] Date of Birth: 09/10/1965  Today's Date: 12/23/2012 Time: 8295-6213 Time Calculation (min): 45 min  Short Term Goals: Week 3: SLP Short Term Goal 1 (Week 3): Pt will maintain topic of conversation for 5 turns with Mod A mutlimodal cueing SLP Short Term Goal 2 (Week 3): Pt will recall safety precautions with Max A multimodal cueing. SLP Short Term Goal 3 (Week 3): Pt will demonstrate selective attention to a functional task for 15 minutes with Mod A verbal cues for redirection  SLP Short Term Goal 4 (Week 3): Pt will orient to time, place and situation with Min A multimodal cues.  SLP Short Term Goal 5 (Week 3): Pt will demonstrate functional problem solving for basic and familiar tasks with Min A multimodal cueing.  SLP Short Term Goal 6 (Week 3): Pt will identify 1 physical and 1 cognitive deficit with Max A question and visual cues.   Skilled Therapeutic Interventions: Skilled treatment focused on cognitive goals. SLP facilitated session with card game well-known to pt. Pt Provided verbal instructions to SLP and answered questions regarding basic addition/subtraction with Min verbal and visual cues from clinician. Pt verbalized physical deficits including his broken leg/thumb with supervision level question cues, however required total assist in demonstrating intellectual awareness of cognitive deficits. Pt required Mod verbal and question cues for redirection to task in a minimally distracting environment. Continue plan of care.   FIM:  Comprehension Comprehension Mode: Auditory Comprehension: 5-Understands basic 90% of the time/requires cueing < 10% of the time Expression Expression: 4-Expresses basic 75 - 89% of the time/requires cueing 10 - 24% of the time. Needs helper to occlude trach/needs to repeat words. Social Interaction Social Interaction: 4-Interacts appropriately 75 - 89%  of the time - Needs redirection for appropriate language or to initiate interaction. Problem Solving Problem Solving: 4-Solves basic 75 - 89% of the time/requires cueing 10 - 24% of the time Memory Memory: 4-Recognizes or recalls 75 - 89% of the time/requires cueing 10 - 24% of the time  Pain Pain Assessment Pain Assessment: 0-10 Pain Score: 8  Pain Intervention(s): RN made aware (Pt did not want to request meds from nurse at this time)  Therapy/Group: Individual Therapy  Maxcine Ham 12/23/2012, 12:13 PM  Maxcine Ham, M.A. CCC-SLP

## 2012-12-23 NOTE — Progress Notes (Signed)
Occupational Therapy Session Note  Patient Details  Name: [name] MRN: [id number] Date of Birth: 1965/09/09  Today's Date: 12/23/2012 Time: 4098-1191 Time Calculation (min): 45 min  Short Term Goals: Week 2:  OT Short Term Goal 1 (Week 2): Pt will be oriented x 3 with min questiong cues OT Short Term Goal 1 - Progress (Week 2): Met OT Short Term Goal 2 (Week 2): Pt will perform basic toilet transfers with min A consistently OT Short Term Goal 2 - Progress (Week 2): Met OT Short Term Goal 3 (Week 2): Pt will recall LUE and RLE precautions with mod cuing during functional tasks OT Short Term Goal 3 - Progress (Week 2): Met OT Short Term Goal 4 (Week 2): Pt will recall and demonstrate compensatory dressing strategies with mod cuing OT Short Term Goal 4 - Progress (Week 2): Met OT Short Term Goal 5 (Week 2): Pt will demonstrate emergent awareness during functional activities with mod cuing OT Short Term Goal 5 - Progress (Week 2): Met  Skilled Therapeutic Interventions/Progress Updates:    1:1 focus on basic transfer training with recall and maintaining right LE and left UE precautions, sit to stand with maintaining balance with TDWB, functional transfers in and out of bed, practicing donning and doffing right LE brace with practice performing straight leg raises to get brace underneath LE.   Therapy Documentation Precautions:  Precautions Precautions: Fall Required Braces or Orthoses: Other Brace/Splint Other Brace/Splint: short arm  cast on Lt. UE, Rt. Bledsoe brace Restrictions Weight Bearing Restrictions: Yes LUE Weight Bearing: Non weight bearing RLE Weight Bearing: Touchdown weight bearing Other Position/Activity Restrictions: No ROM to Rt. knee Pain: 7/10 pain - reported to RN  See FIM for current functional status  Therapy/Group: Individual Therapy  [name] [facility] 12/23/2012, 2:28 PM

## 2012-12-23 NOTE — Progress Notes (Signed)
Occupational Therapy Weekly Progress Note  Patient Details  Name: [name] MRN: [id number] Date of Birth: 1966-01-11  Today's Date: 12/23/2012  Patient has met 3 of 3 short term goals. Pt has made steady functional gains this past week with BADLs.  Pt continues to require min/mod verbal cues for adherence to weight bearing restrictions.  Pt can verbalize restrictions but requires the verbal cues during functional tasks.  Pt requires min verbal cues for orientation.  Pt continues to require mod verbal cues for emergent awareness when engaged in BADLs and transfers.  Pt demonstrates divided attention during BADLs with supervision; but continues to be tangential often requiring A for topic maintenance  .  Marland Kitchen   Patient continues to demonstrate the following deficits:muscle weakness, decreased attention, decreased awareness, decreased problem solving, decreased safety awareness, decreased memory and delayed processing and decreased standing balance, decreased balance strategies and difficulty maintaining precautions and therefore will continue to benefit from skilled OT intervention to enhance overall performance with BADL and Reduce care partner burden.  Patient progressing toward long term goals..  Continue plan of care.  OT Short Term Goals Week 2:  OT Short Term Goal 1 (Week 2): Pt will be oriented x 3 with min questiong cues OT Short Term Goal 1 - Progress (Week 2): Met OT Short Term Goal 2 (Week 2): Pt will perform basic toilet transfers with min A consistently OT Short Term Goal 2 - Progress (Week 2): Met OT Short Term Goal 3 (Week 2): Pt will recall LUE and RLE precautions with mod cuing during functional tasks OT Short Term Goal 3 - Progress (Week 2): Met OT Short Term Goal 4 (Week 2): Pt will recall and demonstrate compensatory dressing strategies with mod cuing OT Short Term Goal 4 - Progress (Week 2): Met OT Short Term Goal 5 (Week 2): Pt will demonstrate emergent awareness during  functional activities with mod cuing OT Short Term Goal 5 - Progress (Week 2): Met Week 3:  OT Short Term Goal 1 (Week 3): Pt will perform toilet transfers with supervision OT Short Term Goal 2 (Week 3): Pt will perform bathing with supervision OT Short Term Goal 3 (Week 3): Pt will perform LB dressing with min A OT Short Term Goal 4 (Week 3): Pt will demonstrate emergent awareness with min verbal cues  Skilled Therapeutic Interventions/Progress Updates:    continued POC  Therapy Documentation Precautions:  Precautions Precautions: Fall Required Braces or Orthoses: Other Brace/Splint Other Brace/Splint: short arm  cast on Lt. UE, Rt. Bledsoe brace Restrictions Weight Bearing Restrictions: Yes LUE Weight Bearing: Non weight bearing RLE Weight Bearing: Touchdown weight bearing Other Position/Activity Restrictions: No ROM to Rt. knee Pain: Pt denies pain  See FIM for current functional status  Therapy/Group: Individual Therapy  Rich Brave 12/23/2012, 7:03 AM

## 2012-12-23 NOTE — Progress Notes (Signed)
Patient ID: [name], male   DOB: Sep 27, 1965, 47 y.o.   MRN: [id number] Subjective/Complaints: No new issues over weekend. Pain well controlled  Review of Systems otherwise negative     Objective: Vital Signs: Blood pressure 99/45, pulse 83, temperature 98.1 F (36.7 C), temperature source Oral, resp. rate 19, height 5\' 7"  (1.702 m), weight 79 kg (174 lb 2.6 oz), SpO2 100.00%. No results found. Results for orders placed during the hospital encounter of 12/05/12 (from the past 72 hour(s))  CBC     Status: Abnormal   Collection Time    12/21/12  9:53 AM      Result Value Range   WBC 6.8  4.0 - 10.5 K/uL   RBC 3.18 (*) 4.22 - 5.81 MIL/uL   Hemoglobin 9.1 (*) 13.0 - 17.0 g/dL   HCT 78.2 (*) 95.6 - 21.3 %   MCV 85.8  78.0 - 100.0 fL   MCH 28.6  26.0 - 34.0 pg   MCHC 33.3  30.0 - 36.0 g/dL   RDW 08.6  57.8 - 46.9 %   Platelets 429 (*) 150 - 400 K/uL  URINALYSIS, ROUTINE W REFLEX MICROSCOPIC     Status: Abnormal   Collection Time    12/21/12  5:44 PM      Result Value Range   Color, Urine YELLOW  YELLOW   APPearance CLOUDY (*) CLEAR   Specific Gravity, Urine 1.019  1.005 - 1.030   pH 7.5  5.0 - 8.0   Glucose, UA NEGATIVE  NEGATIVE mg/dL   Hgb urine dipstick NEGATIVE  NEGATIVE   Bilirubin Urine NEGATIVE  NEGATIVE   Ketones, ur NEGATIVE  NEGATIVE mg/dL   Protein, ur NEGATIVE  NEGATIVE mg/dL   Urobilinogen, UA 0.2  0.0 - 1.0 mg/dL   Nitrite NEGATIVE  NEGATIVE   Leukocytes, UA NEGATIVE  NEGATIVE   Comment: MICROSCOPIC NOT DONE ON URINES WITH NEGATIVE PROTEIN, BLOOD, LEUKOCYTES, NITRITE, OR GLUCOSE <1000 mg/dL.     Vitals reviewed.  HENT:  Head: Normocephalic.  Eyes:  Pupils reactive to light.  Neck: Normal range of motion. Neck supple. No thyromegaly present.  Cardiovascular: Normal rate and regular rhythm.  Pulmonary/Chest: Effort normal and breath sounds normal. No respiratory distress.  Abdominal: Soft. Bowel sounds are normal. He exhibits no distension.   Musculoskeletal:  Left thumb spica splint/cast in place  Neurological:   . He needed multiple cues for place and situation. .  motor strength limited exam secondary to left thumb spica cast and right thumb spica splint and right knee immobilizer.  5/5 bilateral deltoid bicep triceps and left hip flexor knee extensor ankle dorsiflexor plantar flexor  Right ankle dorsiflexor plantar flexor 3+/5. Sensation intact to light touch in left lower extremities, Decreased sensory R Lat thigh and Left dital thumb. Patient is less distractible. Better insight and awareness.  Skin: clean and intact right thigh wound.   Assessment/Plan: 1. 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 Physiatrist is providing close team supervision and 24 hour management of active medical problems listed below. Physiatrist and rehab team continue to assess barriers to discharge/monitor patient progress toward functional and medical goals.  More awareness and insight noted.  Still needs supervision for safety. Placement pending  FIM: FIM - Bathing Bathing Steps Patient Completed: Chest;Right Arm;Left Arm;Abdomen;Front perineal area;Buttocks;Right upper leg;Left upper leg;Left lower leg (including foot);Right lower leg (including  foot) Bathing: 4: Steadying assist  FIM - Upper Body Dressing/Undressing Upper body dressing/undressing steps patient completed: Thread/unthread right sleeve of pullover shirt/dresss;Thread/unthread left sleeve of pullover shirt/dress;Put head through opening of pull over shirt/dress;Pull shirt over trunk Upper body dressing/undressing: 5: Set-up assist to: Obtain clothing/put away FIM - Lower Body Dressing/Undressing Lower body dressing/undressing steps patient completed: Thread/unthread left underwear leg;Thread/unthread right  underwear leg;Pull underwear up/down;Thread/unthread right pants leg;Thread/unthread left pants leg;Pull pants up/down;Don/Doff left shoe;Fasten/unfasten pants;Don/Doff left sock Lower body dressing/undressing: 5: Set-up assist to: Don/Doff AFO/prosthesis/orthosis  FIM - Toileting Toileting steps completed by patient: Adjust clothing prior to toileting;Performs perineal hygiene;Adjust clothing after toileting Toileting Assistive Devices: Grab bar or rail for support Toileting: 4: Steadying assist  FIM - Diplomatic Services operational officer Devices: Elevated toilet seat;Grab bars Toilet Transfers: 4-To toilet/BSC: Min A (steadying Pt. > 75%);4-From toilet/BSC: Min A (steadying Pt. > 75%)  FIM - Bed/Chair Transfer Bed/Chair Transfer Assistive Devices: Arm rests Bed/Chair Transfer: 5: Supine > Sit: Supervision (verbal cues/safety issues);5: Sit > Supine: Supervision (verbal cues/safety issues);5: Bed > Chair or W/C: Supervision (verbal cues/safety issues);5: Chair or W/C > Bed: Supervision (verbal cues/safety issues)  FIM - Locomotion: Wheelchair Distance: 150 Locomotion: Wheelchair: 5: Travels 150 ft or more: maneuvers on rugs and over door sills with supervision, cueing or coaxing FIM - Locomotion: Ambulation Locomotion: Ambulation Assistive Devices: TEFL teacher Ambulation/Gait Assistance: 5: Supervision Locomotion: Ambulation: 1: Travels less than 50 ft with supervision/safety issues  Comprehension Comprehension Mode: Auditory Comprehension: 5-Understands basic 90% of the time/requires cueing < 10% of the time  Expression Expression Mode: Verbal Expression: 2-Expresses basic 25 - 49% of the time/requires cueing 50 - 75% of the time. Uses single words/gestures.  Social Interaction Social Interaction: 4-Interacts appropriately 75 - 89% of the time - Needs redirection for appropriate language or to initiate interaction.  Problem Solving Problem Solving: 3-Solves basic 50  - 74% of the time/requires cueing 25 - 49% of the time  Memory Memory: 3-Recognizes or recalls 50 - 74% of the time/requires cueing 25 - 49% of the time Medical Problem List and Plan:  1. Traumatic brain injury/ multitrauma  2. DVT Prophylaxis/Anticoagulation: Subcutaneous Lovenox. Monitor platelet counts any signs of bleeding  3. Pain Management: Ultram decreased to 50 mg every 6 hours (sedation) and oxycodone as needed for breakthrough pain. Tolerating therapy activities at present. Pain control adequate 4. Neuropsych: This patient is not capable of making decisions on his own behalf.  5. Multiple facial fractures. Status post ORIF of right tripod fracture 12/04/2012    6. Right femur and acetabular as well as patella fracture. Status post ORIF right transverse anterior column with removal of broken hardware and intramedullary nail right femur 11/29/2012. Patient is touchdown weightbearing   -working on adherence to precautions 7. Open fracture dislocation, left thumb MCP joint status post ORIF and pinning of fracture with ulnar collateral ligament repair reconstruction of left from 11/29/2012. Patient is nonweightbearing --sutures, cast changed thursday 8. Acute blood loss anemia. Latest hemoglobin up to 9.1 9. History of alcohol and drug abuse. Urine drug screen positive for cocaine.  Provide counseling  10. Lethargy: resolved   11.  Left thumb numbness likely superficial radial nerve injury 12.  R lateral thigh numb, likely R Lateral femoral Cut nerve injury--education provided     LOS (Days) 18 A FACE TO FACE EVALUATION WAS PERFORMED  SWARTZ,ZACHARY T 12/23/2012, 7:46 AM

## 2012-12-24 ENCOUNTER — Inpatient Hospital Stay (HOSPITAL_COMMUNITY): Payer: No Typology Code available for payment source

## 2012-12-24 ENCOUNTER — Inpatient Hospital Stay (HOSPITAL_COMMUNITY): Payer: Self-pay

## 2012-12-24 ENCOUNTER — Inpatient Hospital Stay (HOSPITAL_COMMUNITY): Payer: Self-pay | Admitting: Occupational Therapy

## 2012-12-24 ENCOUNTER — Inpatient Hospital Stay (HOSPITAL_COMMUNITY): Payer: No Typology Code available for payment source | Admitting: Physical Therapy

## 2012-12-24 NOTE — Progress Notes (Signed)
Speech Language Pathology Daily Session Note  Patient Details  Name: [name] MRN: [id number] Date of Birth: [birth year]  Today's Date: 12/24/2012 Time: 7829-5621 Time Calculation (min): 45 min  Short Term Goals: Week 3: SLP Short Term Goal 1 (Week 3): Pt will maintain topic of conversation for 5 turns with Mod A mutlimodal cueing SLP Short Term Goal 2 (Week 3): Pt will recall safety precautions with Max A multimodal cueing. SLP Short Term Goal 3 (Week 3): Pt will demonstrate selective attention to a functional task for 15 minutes with Mod A verbal cues for redirection  SLP Short Term Goal 4 (Week 3): Pt will orient to time, place and situation with Min A multimodal cues.  SLP Short Term Goal 5 (Week 3): Pt will demonstrate functional problem solving for basic and familiar tasks with Min A multimodal cueing.  SLP Short Term Goal 6 (Week 3): Pt will identify 1 physical and 1 cognitive deficit with Max A question and visual cues.   Skilled Therapeutic Interventions: Skilled treatment focused on cognitive goals. SLP facilitated breakfast meal with Mod cues for redirection during conversation when pt would become tangential. Pt exhibited basic problem solving with SLP supervision and identified physical deficits related to his hand/leg with supervision question cues. Pt was able to recall general details regarding what he did in his therapies yesterday with supervision level question cues from SLP. He used today's paper to determine the date/day of week with Mod I. Continue plan of care.   FIM:  Comprehension Comprehension Mode: Auditory Comprehension: 5-Understands basic 90% of the time/requires cueing < 10% of the time Expression Expression Mode: Verbal Expression: 4-Expresses basic 75 - 89% of the time/requires cueing 10 - 24% of the time. Needs helper to occlude trach/needs to repeat words. Social Interaction Social Interaction: 4-Interacts appropriately 75 - 89% of the time -  Needs redirection for appropriate language or to initiate interaction. Problem Solving Problem Solving: 5-Solves basic 90% of the time/requires cueing < 10% of the time Memory Memory: 4-Recognizes or recalls 75 - 89% of the time/requires cueing 10 - 24% of the time FIM - Eating Eating Activity: 6: More than reasonable amount of time  Pain Pain Assessment Pain Assessment: No/denies pain Pain Score: 9  Pain Type: Acute pain Pain Location: Leg Pain Orientation: Right Pain Descriptors / Indicators: Aching Pain Frequency: Constant Pain Onset: On-going Pain Intervention(s): Medication (See eMAR)  Therapy/Group: Individual Therapy  Maxcine Ham 12/24/2012, 10:19 AM  Maxcine Ham, M.A. CCC-SLP

## 2012-12-24 NOTE — Progress Notes (Signed)
Patient ID: [name], male   DOB: [birth year], 47 y.o.   MRN: [id number] Subjective/Complaints: Wondered what results of urine test were. Pain a little more intense in the right leg after working hard in PT yesterday  Review of Systems otherwise negative     Objective: Vital Signs: Blood pressure 96/64, pulse 97, temperature 98.4 F (36.9 C), temperature source Oral, resp. rate 19, height 5\' 7"  (1.702 m), weight 79 kg (174 lb 2.6 oz), SpO2 100.00%. No results found. Results for orders placed during the hospital encounter of 12/05/12 (from the past 72 hour(s))  CBC     Status: Abnormal   Collection Time    12/21/12  9:53 AM      Result Value Range   WBC 6.8  4.0 - 10.5 K/uL   RBC 3.18 (*) 4.22 - 5.81 MIL/uL   Hemoglobin 9.1 (*) 13.0 - 17.0 g/dL   HCT 24.4 (*) 01.0 - 27.2 %   MCV 85.8  78.0 - 100.0 fL   MCH 28.6  26.0 - 34.0 pg   MCHC 33.3  30.0 - 36.0 g/dL   RDW 53.6  64.4 - 03.4 %   Platelets 429 (*) 150 - 400 K/uL  URINALYSIS, ROUTINE W REFLEX MICROSCOPIC     Status: Abnormal   Collection Time    12/21/12  5:44 PM      Result Value Range   Color, Urine YELLOW  YELLOW   APPearance CLOUDY (*) CLEAR   Specific Gravity, Urine 1.019  1.005 - 1.030   pH 7.5  5.0 - 8.0   Glucose, UA NEGATIVE  NEGATIVE mg/dL   Hgb urine dipstick NEGATIVE  NEGATIVE   Bilirubin Urine NEGATIVE  NEGATIVE   Ketones, ur NEGATIVE  NEGATIVE mg/dL   Protein, ur NEGATIVE  NEGATIVE mg/dL   Urobilinogen, UA 0.2  0.0 - 1.0 mg/dL   Nitrite NEGATIVE  NEGATIVE   Leukocytes, UA NEGATIVE  NEGATIVE   Comment: MICROSCOPIC NOT DONE ON URINES WITH NEGATIVE PROTEIN, BLOOD, LEUKOCYTES, NITRITE, OR GLUCOSE <1000 mg/dL.  OCCULT BLOOD X 1 CARD TO LAB, STOOL     Status: None   Collection Time    12/23/12  1:00 PM      Result Value Range   Fecal Occult Bld NEGATIVE  NEGATIVE     Vitals reviewed.  HENT:  Head: Normocephalic.  Eyes:  Pupils reactive to light.  Neck: Normal range of motion. Neck supple. No  thyromegaly present.  Cardiovascular: Normal rate and regular rhythm.  Pulmonary/Chest: Effort normal and breath sounds normal. No respiratory distress.  Abdominal: Soft. Bowel sounds are normal. He exhibits no distension.  Musculoskeletal:  Left thumb spica splint/cast in place  Neurological:   . He needed multiple cues for place and situation. .  motor strength limited exam secondary to left thumb spica cast and right thumb spica splint and right knee immobilizer.  5/5 bilateral deltoid bicep triceps and left hip flexor knee extensor ankle dorsiflexor plantar flexor  Right ankle dorsiflexor plantar flexor 3+/5. Sensation intact to light touch in left lower extremities, Decreased sensory R Lat thigh and Left dital thumb. Patient is less distractible. Better insight and awareness.  Skin: clean and intact right thigh wound.   Assessment/Plan: 1. 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 Physiatrist is providing close team supervision and 24 hour management of active medical problems listed below.  Physiatrist and rehab team continue to assess barriers to discharge/monitor patient progress toward functional and medical goals.   Still needs supervision for safety. Placement pending  FIM: FIM - Bathing Bathing Steps Patient Completed: Chest;Right Arm;Left Arm;Abdomen;Front perineal area;Buttocks;Right upper leg;Left upper leg;Left lower leg (including foot);Right lower leg (including foot) Bathing: 4: Steadying assist  FIM - Upper Body Dressing/Undressing Upper body dressing/undressing steps patient completed: Thread/unthread right sleeve of pullover shirt/dresss;Thread/unthread left sleeve of pullover shirt/dress;Put head through opening of pull over shirt/dress;Pull shirt over trunk Upper body dressing/undressing: 5: Set-up  assist to: Obtain clothing/put away FIM - Lower Body Dressing/Undressing Lower body dressing/undressing steps patient completed: Thread/unthread left underwear leg;Thread/unthread right underwear leg;Pull underwear up/down;Thread/unthread right pants leg;Thread/unthread left pants leg;Pull pants up/down;Don/Doff left shoe;Fasten/unfasten pants;Don/Doff left sock Lower body dressing/undressing: 4: Min-Patient completed 75 plus % of tasks  FIM - Toileting Toileting steps completed by patient: Adjust clothing prior to toileting;Performs perineal hygiene;Adjust clothing after toileting Toileting Assistive Devices: Grab bar or rail for support Toileting: 4: Steadying assist  FIM - Diplomatic Services operational officer Devices: Grab bars Toilet Transfers: 4-To toilet/BSC: Min A (steadying Pt. > 75%);4-From toilet/BSC: Min A (steadying Pt. > 75%)  FIM - Bed/Chair Transfer Bed/Chair Transfer Assistive Devices: Arm rests Bed/Chair Transfer: 5: Supine > Sit: Supervision (verbal cues/safety issues);5: Bed > Chair or W/C: Supervision (verbal cues/safety issues);5: Chair or W/C > Bed: Supervision (verbal cues/safety issues)  FIM - Locomotion: Wheelchair Distance: 150 Locomotion: Wheelchair: 5: Travels 150 ft or more: maneuvers on rugs and over door sills with supervision, cueing or coaxing FIM - Locomotion: Ambulation Locomotion: Ambulation Assistive Devices: TEFL teacher Ambulation/Gait Assistance: 5: Supervision Locomotion: Ambulation: 5: Travels 150 ft or more with supervision/safety issues  Comprehension Comprehension Mode: Auditory Comprehension: 5-Understands basic 90% of the time/requires cueing < 10% of the time  Expression Expression Mode: Verbal Expression: 2-Expresses basic 25 - 49% of the time/requires cueing 50 - 75% of the time. Uses single words/gestures.  Social Interaction Social Interaction: 4-Interacts appropriately 75 - 89% of the time - Needs redirection for  appropriate language or to initiate interaction.  Problem Solving Problem Solving: 3-Solves basic 50 - 74% of the time/requires cueing 25 - 49% of the time  Memory Memory: 3-Recognizes or recalls 50 - 74% of the time/requires cueing 25 - 49% of the time Medical Problem List and Plan:  1. Traumatic brain injury/ multitrauma  2. DVT Prophylaxis/Anticoagulation: Subcutaneous Lovenox. Monitor platelet counts any signs of bleeding  3. Pain Management: Ultram decreased to 50 mg every 6 hours (sedation) and oxycodone as needed for breakthrough pain. Tolerating therapy activities at present. Pain control adequate 4. Neuropsych: This patient is not capable of making decisions on his own behalf.  5. Multiple facial fractures. Status post ORIF of right tripod fracture 12/04/2012    6. Right femur and acetabular as well as patella fracture. Status post ORIF right transverse anterior column with removal of broken hardware and intramedullary nail right femur 11/29/2012. Patient is touchdown weightbearing   -working on adherence to precautions  -improving sensation, nerve function in the right leg 7. Open fracture dislocation, left thumb MCP joint status post ORIF and pinning of fracture with ulnar collateral ligament repair reconstruction of left from 11/29/2012. Patient is nonweightbearing --sutures, cast changed thursday 8. Acute blood loss anemia. Latest hemoglobin up to 9.1 9. History of alcohol and drug abuse. counseling  10. Lethargy: resolved   11.  Left thumb numbness likely superficial radial nerve injury  LOS (Days) 19 A FACE TO FACE EVALUATION WAS PERFORMED  SWARTZ,ZACHARY T 12/24/2012, 7:37 AM
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
## 2012-12-24 NOTE — Progress Notes (Signed)
Occupational Therapy Session Note  Patient Details  Name: [name] MRN: [id number] Date of Birth: [birth year]  Today's Date: 12/24/2012 Time: 1400-1445 Time Calculation (min): 45 min  Short Term Goals: Week 3:  OT Short Term Goal 1 (Week 3): Pt will perform toilet transfers with supervision OT Short Term Goal 2 (Week 3): Pt will perform bathing with supervision OT Short Term Goal 3 (Week 3): Pt will perform LB dressing with min A OT Short Term Goal 4 (Week 3): Pt will demonstrate emergent awareness with min verbal cues  Skilled Therapeutic Interventions/Progress Updates:    1:1 focus on working and short term memory, functional ambulation and mobility, dynamic standing balance at mirror while perform naming memory activities in a moderately distracting environment. Pt required to recall 3 categories in order for naming items - requiring mod A to recall order. Pt able to maintain dynamic balance with and without UE support with supervision to min A with seated rest breaks in between with mod tactile cues for maintaining weight bearing precautions through right LE.   Therapy Documentation Precautions:  Precautions Precautions: Fall Required Braces or Orthoses: Other Brace/Splint Other Brace/Splint: short arm  cast on Lt. UE, Rt. Bledsoe brace Restrictions Weight Bearing Restrictions: Yes LUE Weight Bearing: Non weight bearing RLE Weight Bearing: Touchdown weight bearing Other Position/Activity Restrictions: No ROM to Rt. knee Pain:  soreness in right LE  See FIM for current functional status  Therapy/Group: Individual Therapy  [name] [facility] 12/24/2012, 4:37 PM

## 2012-12-24 NOTE — Progress Notes (Signed)
Occupational Therapy Session Note  Patient Details  Name: [name] MRN: [id number] Date of Birth: [birth year]  Today's Date: 12/24/2012  Session 1 Time: 0700-0800 Time Calculation (min): 60 min  Short Term Goals: Week 3:  OT Short Term Goal 1 (Week 3): Pt will perform toilet transfers with supervision OT Short Term Goal 2 (Week 3): Pt will perform bathing with supervision OT Short Term Goal 3 (Week 3): Pt will perform LB dressing with min A OT Short Term Goal 4 (Week 3): Pt will demonstrate emergent awareness with min verbal cues  Skilled Therapeutic Interventions/Progress Updates:    Pt engaged in BADL retraining including doffing/donning Bledsoe brace to bathe RLE, transfers, and bathing and dressing with sit<>stand from w/c at sink.  Pt required min verbal cues for donning Bledsoe brace while seated in bed with RLE supported on bed.  Pt was able to bathe Rt foot and don sock without assistance.  Pt continues to require verbal cues to maintain weight bearing precausitons while transferring.  Although patient is able to engage in conversation throughout bathing/dressing tasks his conversation remains tangential.  Focus on safety awareness and dynamic standing balance.  Therapy Documentation Precautions:  Precautions Precautions: Fall Required Braces or Orthoses: Other Brace/Splint Other Brace/Splint: short arm  cast on Lt. UE, Rt. Bledsoe brace Restrictions Weight Bearing Restrictions: Yes LUE Weight Bearing: Non weight bearing RLE Weight Bearing: Touchdown weight bearing Other Position/Activity Restrictions: No ROM to Rt. knee Pain: Pain Assessment Pain Assessment: No/denies pain  See FIM for current functional status  Therapy/Group: Individual Therapy  Session 2 Time: 1130-1200 Pt c/o 7/10 pain in RLE; RN aware and administered meds during session Individual Therapy  Pt resting in bed upon arrival.  Pt requested to use BR and transferred to w/c, rolled into BR,  and transferred to toilet with steady A.  Pt stood from toilet unassisted using grab bar.  Reeducated patient in importance of staff assistance when transferring.  Pt rolled back into room and stood at sink to engage in grooming tasks.  Pt is able to maintain standing balance while adhering to TDWB precautions. Focus on safe transfers, dynamic standing balance, and toileting.  Session 3 Time: 1330-1400 Pt denies pain Individual therapy  Pt resting in bed upon arrival and transferred to w/c before rolling to therapy gym.  Pt engaged in squats ( 3 sets of 8)  with LLE while maintaining RLE in non weight bearing.  Pt also directed therapist in doffing/donning Bledsoe brace.  Pt reeducated in proper placement of Bledsoe brace.     [name] [facility] 12/24/2012, 8:04 AM

## 2012-12-24 NOTE — Progress Notes (Signed)
Physical Therapy Session Note  Patient Details  Name: [name] MRN: [id number] Date of Birth: 06/06/1965  Today's Date: 12/24/2012 Time: 0930-1025 Time Calculation (min): 55 min   Skilled Therapeutic Interventions/Progress Updates:    Ambulation x 150' with PFRW, theraband sling for Rt. LE and supervision primarily for safety. Ambulation in gym with PFRW to complete 3 activity task (3 games of "hangman" then finding a cone, min verbal cues for remembering 3 tasks max verbal cues needed for hangman game. Pt limited by decreased attention and decreased ability to follow his own train of thought. Pt demonstrates better ability to come up with his own word puzzles than guessing word produced by therapist. Pt refused ball toss for dynamic balance, wanted to work on "feeling his leg." Performed active plantarflexion/dorsiflexion with passive overstretch. Isometric quad contraction, active assisted straight leg raise. Brace adjusted.   Throughout session addressed safety awareness, orientation (not oriented to place or situation), sustained attention during functional activity, reasoning for precautions and adherence to them.   Therapy Documentation Precautions:  Precautions Precautions: Fall Required Braces or Orthoses: Other Brace/Splint Other Brace/Splint: short arm  cast on Lt. UE, Rt. Bledsoe brace Restrictions Weight Bearing Restrictions: Yes LUE Weight Bearing: Non weight bearing RLE Weight Bearing: Touchdown weight bearing Other Position/Activity Restrictions: No ROM to Rt. knee  Pain: Pain Assessment Pain Assessment: No/denies pain Faces Pain Scale: Hurts little more Pain Type: Surgical pain Pain Location: Leg Pain Orientation: Right Pain Descriptors / Indicators: Aching Pain Onset: With Activity Pain Intervention(s): Repositioned;RN made aware;Rest  See FIM for current functional status  Therapy/Group: Individual Therapy  [name] 12/24/2012, 11:58  AM
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
## 2012-12-25 ENCOUNTER — Inpatient Hospital Stay (HOSPITAL_COMMUNITY): Payer: No Typology Code available for payment source | Admitting: Physical Therapy

## 2012-12-25 ENCOUNTER — Inpatient Hospital Stay (HOSPITAL_COMMUNITY): Payer: Self-pay

## 2012-12-25 ENCOUNTER — Inpatient Hospital Stay (HOSPITAL_COMMUNITY): Payer: No Typology Code available for payment source

## 2012-12-25 DIAGNOSIS — S069X9A Unspecified intracranial injury with loss of consciousness of unspecified duration, initial encounter: Secondary | ICD-10-CM

## 2012-12-25 NOTE — Progress Notes (Signed)
Patient ID: [name], male   DOB: 07-23-1965, 47 y.o.   MRN: [id number] Subjective/Complaints: No problems overnight.  Pain a little more intense in the right leg after working hard in PT yesterday  Review of Systems otherwise negative     Objective: Vital Signs: Blood pressure 93/58, pulse 96, temperature 98.4 F (36.9 C), temperature source Oral, resp. rate 18, height 5\' 7"  (1.702 m), weight 62.596 kg (138 lb), SpO2 99.00%. No results found. Results for orders placed during the hospital encounter of 12/05/12 (from the past 72 hour(s))  OCCULT BLOOD X 1 CARD TO LAB, STOOL     Status: None   Collection Time    12/23/12  1:00 PM      Result Value Range   Fecal Occult Bld NEGATIVE  NEGATIVE     Vitals reviewed.  HENT:  Head: Normocephalic.  Eyes:  Pupils reactive to light.  Neck: Normal range of motion. Neck supple. No thyromegaly present.  Cardiovascular: Normal rate and regular rhythm.  Pulmonary/Chest: Effort normal and breath sounds normal. No respiratory distress.  Abdominal: Soft. Bowel sounds are normal. He exhibits no distension.  Musculoskeletal:  Left thumb spica splint/cast in place  Neurological:   . He needed multiple cues for place and situation. .  motor strength limited exam secondary to left thumb spica cast and right thumb spica splint and right knee immobilizer.  5/5 bilateral deltoid bicep triceps and left hip flexor knee extensor ankle dorsiflexor plantar flexor  Right ankle dorsiflexor plantar flexor 3+/5. Sensation intact to light touch in left lower extremities, Decreased sensory R Lat thigh and Left dital thumb. Patient is less distractible. Better insight and awareness.  Skin: clean and intact right thigh wound.   Assessment/Plan: 1. Functional deficits secondary to TBI multitrauma acetabular and prox femur fx, Left thumb fx/dislocation which require 3+ hours per day of interdisciplinary therapy in a comprehensive inpatient rehab  setting. Physiatrist is providing close team supervision and 24 hour management of active medical problems listed below. Physiatrist and rehab team continue to assess barriers to discharge/monitor patient progress toward functional and medical goals.  Working on dispo  FIM: FIM - Bathing Bathing Steps Patient Completed: Chest Bathing: 4: Steadying assist  FIM - Upper Body Dressing/Undressing Upper body dressing/undressing steps patient completed: Thread/unthread right sleeve of pullover shirt/dresss;Thread/unthread left sleeve of pullover shirt/dress;Put head through opening of pull over shirt/dress;Pull shirt over trunk Upper body dressing/undressing: 5: Set-up assist to: Obtain clothing/put away FIM - Lower Body Dressing/Undressing Lower body dressing/undressing steps patient completed: Thread/unthread left underwear leg;Thread/unthread right underwear leg;Pull underwear up/down;Thread/unthread right pants leg;Thread/unthread left pants leg;Pull pants up/down;Don/Doff left shoe;Fasten/unfasten pants;Don/Doff left sock Lower body dressing/undressing: 4: Steadying Assist  FIM - Toileting Toileting steps completed by patient: Adjust clothing prior to toileting;Performs perineal hygiene;Adjust clothing after toileting Toileting Assistive Devices: Grab bar or rail for support Toileting: 4: Steadying assist  FIM - Diplomatic Services operational officer Devices: Grab bars Toilet Transfers: 4-To toilet/BSC: Min A (steadying Pt. > 75%);4-From toilet/BSC: Min A (steadying Pt. > 75%)  FIM - Bed/Chair Transfer Bed/Chair Transfer Assistive Devices: Arm rests Bed/Chair Transfer: 4: Supine > Sit: Min A (steadying Pt. > 75%/lift 1 leg);4: Sit > Supine: Min A (steadying pt. > 75%/lift 1 leg);5: Chair or W/C > Bed: Supervision (verbal cues/safety issues);5: Bed > Chair or W/C: Supervision (verbal cues/safety issues)  FIM - Locomotion: Wheelchair Distance: 150 Locomotion: Wheelchair: 5: Travels  150 ft or more: maneuvers on rugs and over door sills with  supervision, cueing or coaxing FIM - Locomotion: Ambulation Locomotion: Ambulation Assistive Devices: TEFL teacher Ambulation/Gait Assistance: 5: Supervision Locomotion: Ambulation: 5: Travels 150 ft or more with supervision/safety issues  Comprehension Comprehension Mode: Auditory Comprehension: 5-Understands basic 90% of the time/requires cueing < 10% of the time  Expression Expression Mode: Verbal Expression: 4-Expresses basic 75 - 89% of the time/requires cueing 10 - 24% of the time. Needs helper to occlude trach/needs to repeat words.  Social Interaction Social Interaction: 4-Interacts appropriately 75 - 89% of the time - Needs redirection for appropriate language or to initiate interaction.  Problem Solving Problem Solving: 5-Solves basic 90% of the time/requires cueing < 10% of the time  Memory Memory: 4-Recognizes or recalls 75 - 89% of the time/requires cueing 10 - 24% of the time Medical Problem List and Plan:  1. Traumatic brain injury/ multitrauma  2. DVT Prophylaxis/Anticoagulation: Subcutaneous Lovenox. Monitor platelet counts any signs of bleeding  3. Pain Management: Ultram decreased to 50 mg every 6 hours (sedation) and oxycodone as needed for breakthrough pain. Tolerating therapy activities at present. Pain control adequate 4. Neuropsych: This patient is not capable of making decisions on his own behalf.  5. Multiple facial fractures. Status post ORIF of right tripod fracture 12/04/2012    6. Right femur and acetabular as well as patella fracture. Status post ORIF right transverse anterior column with removal of broken hardware and intramedullary nail right femur 11/29/2012. Patient is touchdown weightbearing   -working on adherence to precautions  -improving sensation, nerve function in the right leg 7. Open fracture dislocation, left thumb MCP joint status post ORIF and pinning of fracture with ulnar  collateral ligament repair reconstruction of left from 11/29/2012. Patient is nonweightbearing --sutures, cast changed thursday 8. Acute blood loss anemia. Latest hemoglobin up to 9.1 9. History of alcohol and drug abuse. counseling  10. Lethargy: resolved   11.  Left thumb numbness likely superficial radial nerve injury       LOS (Days) 20 A FACE TO FACE EVALUATION WAS PERFORMED  Abriel Geesey T 12/25/2012, 7:58 AM

## 2012-12-25 NOTE — Progress Notes (Signed)
Social Work Patient ID: [name], male   DOB: 12-08-1965, 47 y.o.   MRN: [id number]   Review of team conference with pt's sister and pt - aware pt is medically stable to d/c when SNF bed available.  Sister is completing Medicaid (re)application process.   Alerted sister this morning that I have received two SNF offers with both able to admit pt tomorrow.  She is discussing with pt's mother and to follow up with me ASAP.  Will keep staff posted - anticipate d/c tomorrow with facility TBD.  [name], LCSW
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
## 2012-12-25 NOTE — Progress Notes (Signed)
Occupational Therapy Session Note  Patient Details  Name: [name] MRN: [id number] Date of Birth: 11/10/1965  Today's Date: 12/25/2012 Time: 0700-0800 Time Calculation (min): 60 min  Short Term Goals: Week 3:  OT Short Term Goal 1 (Week 3): Pt will perform toilet transfers with supervision OT Short Term Goal 2 (Week 3): Pt will perform bathing with supervision OT Short Term Goal 3 (Week 3): Pt will perform LB dressing with min A OT Short Term Goal 4 (Week 3): Pt will demonstrate emergent awareness with min verbal cues  Skilled Therapeutic Interventions/Progress Updates:    Pt asleep in bed upon arrival but easily aroused and engaged in bathing and dressing tasks at bed level and sit<>stand from w/c at sink.  Pt directed and assisted therapist with doffing/donning Bledsoe brace on RLE.  Pt required verbal cues to prevent flexing Rt knee.  Pt required verbal cues to adhere to weight bearing precautions during transfer but was able to maintain TDWBing on RLE during bathing and dressing tasks when standing at sink.  Pt's conversation was unusually tangential this morning although he was able to attend to all tasks through completion.    Therapy Documentation Precautions:  Precautions Precautions: Fall Required Braces or Orthoses: Other Brace/Splint Other Brace/Splint: short arm  cast on Lt. UE, Rt. Bledsoe brace Restrictions Weight Bearing Restrictions: Yes LUE Weight Bearing: Non weight bearing RLE Weight Bearing: Touchdown weight bearing Other Position/Activity Restrictions: No ROM to Rt. knee Pain: Pain Assessment Pain Assessment: No/denies pain  See FIM for current functional status  Therapy/Group: Individual Therapy  Rich Brave 12/25/2012, 8:03 AM
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
## 2012-12-25 NOTE — Discharge Summary (Signed)
  Discharge summary job # [id number]

## 2012-12-25 NOTE — Progress Notes (Signed)
Physical Therapy Session Note  Patient Details  Name: [name] MRN: [id number] Date of Birth: 09-19-1965  Today's Date: 12/25/2012 Time: 7829-5621 Time Calculation (min): 43 min   Skilled Therapeutic Interventions/Progress Updates:    Therapy session focused on car transfer, w/c propulsion, dynamic balance, and therapeutic exercise. Pt completed w/c<>car transfer with close S and VC's for set up. Pt completed dynamic balance challenge using L PFRW and close S in standing that consisted of muti-directional reaching and forward throwing. Pt performed supine  R SLR's 1 set x 15 reps for strengthening to improve functional mobility and tranfers to increase pt's independence. Pt completed short distance gait (15 ft) with close S and using L PFRW. VC's and tactile cues with theraband sling were utilized  to ensure R LE touchdown WB precaution. Pt performed NuStep (resistance 6) for 7 minutes   For UE and L LE strengthening and cardiovascular endurance. Pt performed w/c<>bed transfer and bed mobility with S and minimal cues for set up. Minimal c/o pain during therapy session however pt able to tolerate therapy with rest breaks as needed.   Therapy Documentation Precautions: Precautions Precautions: Fall Required Braces or Orthoses: Other Brace/Splint Other Brace/Splint: short arm  cast on Lt. UE, Rt. Bledsoe brace Restrictions Weight Bearing Restrictions: Yes LUE Weight Bearing: Non weight bearing RLE Weight Bearing: Touchdown weight bearing Other Position/Activity Restrictions: pt unable to maintain TWB on R LE, so maintains NWB  See FIM for current functional status  Therapy/Group: Individual Therapy  [place], Raenah Murley 12/25/2012, 2:17 PM

## 2012-12-25 NOTE — Progress Notes (Signed)
Reviewed and in agreement with treatment and discharge assessment provided.  

## 2012-12-25 NOTE — Progress Notes (Signed)
Reviewed and in agreement with treatment provided.  

## 2012-12-25 NOTE — Progress Notes (Signed)
Occupational Therapy Discharge Summary  Patient Details  Name: [name] MRN: [id number] Date of Birth: [birth year]  Today's Date: 12/25/2012  Patient has met 11 of 11 long term goals due to improved activity tolerance, improved balance, postural control, ability to compensate for deficits, functional use of  RIGHT upper, RIGHT lower, LEFT upper and LEFT lower extremity, improved attention and improved awareness.  Pt has made steady progress with BDLs (bathing, dressing, toilet transfers and toileting) during this admission.  Pt continues to require min verbal cues to adhere to weight bearing precautions during transfers.  Pt continues to require min verbal cues to adhere to no ROM with Rt knee when Bledsoe brace removed for bathing at bed level.  Pt exhibits alternating attention during BADLs but conversation continues to be tangential.  Pt exhibits intellectual and emergent awareness of deficits with min A. Pt exhibits behaviors consistent with [place] Level VI. Patient to discharge at overall Supervision to min A level.  Patient's care partner is independent to provide the necessary physical and cognitive assistance at discharge.    Recommendation:  Patient will benefit from ongoing skilled OT services in skilled nursing facility setting to continue to advance functional skills in the area of BADL.  Equipment: No equipment provided Discharge to SNF  Reasons for discharge: treatment goals met and discharge from hospital  Patient/family agrees with progress made and goals achieved: Yes  OT Discharge Precautions/Restrictions  Precautions Precautions: Fall Required Braces or Orthoses: Other Brace/Splint Other Brace/Splint: short arm  cast on Lt. UE, Rt. Bledsoe brace Restrictions Weight Bearing Restrictions: Yes LUE Weight Bearing: Non weight bearing RLE Weight Bearing: Touchdown weight bearing Other Position/Activity Restrictions: pt unable to maintain TWB on R LE, so maintains  NWB ADL ADL Eating: Modified independent Where Assessed-Eating: Wheelchair Grooming: Supervision/safety Where Assessed-Grooming: Sitting at sink Upper Body Bathing: Supervision/safety Where Assessed-Upper Body Bathing: Sitting at sink Lower Body Bathing: Contact guard Where Assessed-Lower Body Bathing: Standing at sink;Sitting at sink Upper Body Dressing: Supervision/safety Where Assessed-Upper Body Dressing: Sitting at sink Lower Body Dressing: Contact guard Where Assessed-Lower Body Dressing: Sitting at sink;Standing at sink Toileting: Contact guard Where Assessed-Toileting: Teacher, adult education: Close supervision Toilet Transfer Method: Stand Education administrator: Grab bars Vision/Perception  Vision - History Baseline Vision: No visual deficits Patient Visual Report: No change from baseline Vision - Assessment Eye Alignment: Within Functional Limits Vision Assessment: Vision not tested Perception Perception: Within Functional Limits Praxis Praxis: Intact  Cognition Overall Cognitive Status: Impaired/Different from baseline Arousal/Alertness: Awake/alert Orientation Level: Oriented X4 Attention: Focused;Sustained;Selective;Alternating Focused Attention: Appears intact Sustained Attention: Appears intact Sustained Attention Impairment: Verbal basic;Functional basic Selective Attention: Appears intact Selective Attention Impairment: Verbal basic;Functional basic Alternating Attention: Appears intact Memory: Impaired Memory Impairment: Decreased recall of new information;Decreased short term memory Decreased Long Term Memory: Functional basic Decreased Short Term Memory: Verbal basic;Functional basic Awareness: Impaired Awareness Impairment: Intellectual impairment Problem Solving: Impaired Problem Solving Impairment: Verbal basic;Functional basic Executive Function: Sequencing;Reasoning;Organizing;Decision Making;Self Monitoring Reasoning:  Impaired Reasoning Impairment: Verbal basic;Functional basic Sequencing: Impaired Sequencing Impairment: Verbal basic;Functional basic Organizing: Impaired Organizing Impairment: Verbal basic;Functional basic Decision Making: Impaired Decision Making Impairment: Verbal basic;Functional basic Self Monitoring: Impaired Self Monitoring Impairment: Verbal basic;Functional basic Self Correcting: Impaired Self Correcting Impairment: Verbal basic;Functional basic Behaviors: Perseveration Safety/Judgment: Impaired Comments: Pt demonstrates increased ability to recall that he is not supposed to use L UE or R LE, however, unable to reason why [facility] Scales of Cognitive Functioning: Confused/appropriate Sensation Sensation Light Touch:  Appears Intact Proprioception: Appears Intact Additional Comments: Pt reports feeling of numbness/tingling on R LE, L UE, and R side of face Coordination Gross Motor Movements are Fluid and Coordinated: Yes Fine Motor Movements are Fluid and Coordinated: Yes Motor  Motor Motor: Within Functional Limits Trunk/Postural Assessment  Cervical Assessment Cervical Assessment: Within Functional Limits Thoracic Assessment Thoracic Assessment: Within Functional Limits Lumbar Assessment Lumbar Assessment: Within Functional Limits Postural Control Postural Control: Within Functional Limits  Balance Static Sitting Balance Static Sitting - Balance Support: Feet unsupported Static Sitting - Level of Assistance: 7: Independent Extremity/Trunk Assessment RUE Assessment RUE Assessment: Within Functional Limits LUE Assessment LUE Assessment: Exceptions to [facility] (WFL at shoulder and elbow, short arm cast)  See FIM for current functional status  Rich Brave 12/25/2012, 2:42 PM

## 2012-12-25 NOTE — Progress Notes (Addendum)
Physical Therapy Note  Patient Details  Name: [name] MRN: [id number] Date of Birth: 1966-05-16 Today's Date: 12/25/2012  0900-0940 (40 minutes) individual (missed 20 minutes- haircut) Pain: no complaint of pain Precautions: NWB LT UE; TDWB RT LE Focus of treatment: Therapeutic activity focused on direction finding/ standing tolerance Treatment: Pt up in wc upon arrival; states that barber is coming for haircut (pt missed 20 minutes); standing tolerance - pt performed Wii bowling game in standing x 2 using RW + sling for RT LE to maintain weight bearing status. Pt able to follow directions with min verbal cues and able to stand X 2 for complete game ( 10-15 minutes).   1100-1125 (25 minutes) individual Pain: no complaint of pain Focus of treatment: Gait training/endurance  ; therapeutic exercise focused on RT LE strengthening to improve functional mobility Treatment: Gait 160 feet left PFRW SBA ; up/down curb  X 2 with PFRW min assist for walker placement; Therapeutic exercise using green theraband min assist to perform assisted Rt SLR to allow pt to place LE on/off bed.   Joylyn Duggin,JIM 12/25/2012, 9:54 AM

## 2012-12-25 NOTE — Progress Notes (Addendum)
Physical Therapy Discharge Summary  Patient Details  Name: [name] MRN: [id number] Date of Birth: 1966-01-25  Today's Date: 12/25/2012 Time: Treatment Session 1: 1030-1100 Time Calculation (min): Treatment Session 1: 30 min  Skilled Therapeutic Interventions  1:1 Pt semi-reclined in bed upon entry, agreeable to PT. Pt requesting to use urinal, pt able to t/f sup>sit EOB w/ HOB elevated and (S). Instead of attempting to use urinal sitting EOB, pt initiated SPT bed>w/c w/out use of L PFRW, sock only on L foot and urinal in R hand. Pt safely assist to chair and pt unable to assess w/ max v/c how situation was unsafe. Pt able to void and manage clothing w/ use of urinal and (S). Pt able to propel w/c x150'+ from room>gym w/ use of L LE. In therapy gym, pt again attempted to perform STP from w/c>tx mat w/out use of L PFRW. Initiated reassessment of pt's cognition, sensation and strength. Wylene Men.  Patient has met 10 of 10 long term goals due to improved activity tolerance, improved balance, increased strength, decreased pain, ability to compensate for deficits, functional use of  right upper extremity and left lower extremity, improved attention, improved awareness and improved coordination.  Patient to discharge at a wheelchair level Supervision.  Pt is being D/C to SNF due to continued need for skilled therapy.  Reasons goals not met: N/A all goals met   Recommendation:  Patient will benefit from ongoing skilled PT services in skilled nursing facility setting to continue to advance safe functional mobility, address ongoing impairments in decreased insight regarding impairments and safety, impulsive, decreased balance, decreased functional endurance, and minimize fall risk.  Equipment: No equipment provided  Reasons for discharge: treatment goals met and discharge from hospital  Patient/family agrees with progress made and goals achieved: Yes  PT  Discharge Precautions/Restrictions Precautions Precautions: Fall Required Braces or Orthoses: Other Brace/Splint Other Brace/Splint: short arm  cast on Lt. UE, Rt. Bledsoe brace Restrictions Weight Bearing Restrictions: Yes LUE Weight Bearing: Non weight bearing RLE Weight Bearing: Touchdown weight bearing Other Position/Activity Restrictions: pt unable to maintain TWB on R LE, so maintains NWB Vision/Perception  Vision - History Baseline Vision: No visual deficits Vision - Assessment Eye Alignment: Within Functional Limits Perception Perception: Within Functional Limits  Cognition Overall Cognitive Status: Impaired/Different from baseline Arousal/Alertness: Lethargic Orientation Level: Oriented to person;Oriented to place;Oriented to situation Attention: Sustained Sustained Attention: Impaired Sustained Attention Impairment: Verbal basic;Functional basic Memory: Impaired Memory Impairment: Storage deficit;Retrieval deficit;Decreased recall of new information;Decreased short term memory;Decreased long term memory Decreased Long Term Memory: Functional basic Decreased Short Term Memory: Verbal basic;Functional basic Awareness: Impaired Awareness Impairment: Intellectual impairment Problem Solving: Impaired Problem Solving Impairment: Verbal basic;Functional basic Executive Function: Reasoning;Sequencing;Organizing;Decision Making;Self Monitoring;Self Correcting Reasoning: Impaired Reasoning Impairment: Verbal basic;Functional basic Sequencing Impairment: Verbal basic;Functional basic Organizing Impairment: Verbal basic;Functional basic Decision Making: Impaired Decision Making Impairment: Verbal basic;Functional basic Self Monitoring: Impaired Self Monitoring Impairment: Verbal basic;Functional basic Self Correcting: Impaired Self Correcting Impairment: Verbal basic;Functional basic Behaviors: Perseveration Safety/Judgment: Impaired Comments: Pt demonstrates increased  ability to recall that he is not supposed to use L UE or R LE, however, unable to reason why Sensation Sensation Light Touch: Appears Intact Proprioception: Appears Intact Additional Comments: Pt reports feeling of numbness/tingling on R LE, L UE, and R side of face Coordination Gross Motor Movements are Fluid and Coordinated: Yes Fine Motor Movements are Fluid and Coordinated: Yes Motor  Motor Motor: Within Functional Limits  Locomotion  Ambulation Ambulation/Gait Assistance: 5:  Supervision Ambulation Distance (Feet): 150 Feet Assistive device: Left platform walker Ambulation/Gait Assistance Details: theraband sling on RW to maintain RT TDWB status Stairs / Additional Locomotion Stairs: Yes Curb: 4: Min Technical sales engineer Assistance: 6: Modified independent (Device/Increase time) Wheelchair Propulsion: Right upper extremity;Left lower extremity Wheelchair Parts Management: Supervision/cueing Distance: 200   Extremity Assessment  RUE Assessment RUE Assessment: Within Functional Limits LUE Assessment LUE Assessment: Within Functional Limits RLE Assessment RLE Assessment: Exceptions to [facility] RLE AROM (degrees) RLE Overall AROM Comments: Hip and ankle ROM does not appear to be limited, knee locked in extended brace RLE Strength RLE Overall Strength Comments: Impaired, unable to MMT due to precautions and brace. Pt demonstrates good functional strength during bed mobility and transfers. Pt utilzes theraband to support R LE for NWB during amb as pt is uanble to consistently maintain TWB precautions. LLE Strength LLE Overall Strength Comments: L hip flexion 3+/5, L knee/ext and ankle DF/PF 4/5  See FIM for current functional status  Denzil Hughes 12/25/2012, 12:06 PM

## 2012-12-25 NOTE — Plan of Care (Signed)
Problem: RH SAFETY Goal: RH STG DEMO UNDERSTANDING HOME SAFETY PRECAUTIONS Outcome: Not Applicable Date Met:  12/25/12 Patient discharged to SNF.

## 2012-12-26 NOTE — Progress Notes (Signed)
Speech Language Pathology Discharge Summary  Patient Details  Name: COADY TRAIN MRN: [id number] Date of Birth: [birth year]  Today's Date: 12/26/2012  Patient has met 4 of 6 long term goals.  Patient to discharge at overall Mod level.  Reasons goals not met:   Pt discharging hospital early due to acceptance at SNF.  Clinical Impression/Discharge Summary: Pt has made functional gains in speech therapy, achieving 4 out of 6 LTGs prior to discharge to SNF. Pt is consuming regular textures with thin liquids with Mod I. Cognitively he requires supervision to Min cues for basic problem solving and Min cues for selective attention. He demonstrates intellectual awareness of physical deficits with supervision question cues, however requires Max cues for intellectual awareness of cognitive changes. He uses Max cues to remind him to use external memory aids.  Pt is scheduled for discharge 7/31 to SNF. Recommend that he continue to receive speech therapy at the next level of care to address cognitive goals and further increase functional independence.     Type of Caregiver Assistance: Cognitive  Recommendation:  Skilled Nursing facility  Rationale for SLP Follow Up: Maximize functional communication;Maximize cognitive function and independence;Reduce caregiver burden   Equipment:   None recommended by SLP.  Reasons for discharge:   Pt is discharging from hospital   See FIM for current functional status  Maxcine Ham 12/26/2012, 10:19 AM  Maxcine Ham, M.A. CCC-SLP

## 2012-12-26 NOTE — Progress Notes (Signed)
Patient ID: [name], male   DOB: Dec 04, 1965, 47 y.o.   MRN: [id number] Subjective/Complaints: No new issues. Pain better  Pain a little more intense in the right leg after working hard in PT yesterday  Review of Systems otherwise negative     Objective: Vital Signs: Blood pressure 97/65, pulse 99, temperature 98.1 F (36.7 C), temperature source Oral, resp. rate 20, height 5\' 7"  (1.702 m), weight 62.4 kg (137 lb 9.1 oz), SpO2 100.00%. No results found. Results for orders placed during the hospital encounter of 12/05/12 (from the past 72 hour(s))  OCCULT BLOOD X 1 CARD TO LAB, STOOL     Status: None   Collection Time    12/23/12  1:00 PM      Result Value Range   Fecal Occult Bld NEGATIVE  NEGATIVE  CREATININE, SERUM     Status: None   Collection Time    12/26/12  6:05 AM      Result Value Range   Creatinine, Ser 0.96  0.50 - 1.35 mg/dL   GFR calc non Af Amer >90  >90 mL/min   GFR calc Af Amer >90  >90 mL/min   Comment:            The eGFR has been calculated     using the CKD EPI equation.     This calculation has not been     validated in all clinical     situations.     eGFR's persistently     <90 mL/min signify     possible Chronic Kidney Disease.     Vitals reviewed.  HENT:  Head: Normocephalic.  Eyes:  Pupils reactive to light.  Neck: Normal range of motion. Neck supple. No thyromegaly present.  Cardiovascular: Normal rate and regular rhythm.  Pulmonary/Chest: Effort normal and breath sounds normal. No respiratory distress.  Abdominal: Soft. Bowel sounds are normal. He exhibits no distension.  Musculoskeletal:  Left thumb spica splint/cast in place  Neurological:   . He needed multiple cues for place and situation. .  motor strength limited exam secondary to left thumb spica cast and right thumb spica splint and right knee immobilizer.  5/5 bilateral deltoid bicep triceps and left hip flexor knee extensor ankle dorsiflexor plantar flexor  Right ankle  dorsiflexor plantar flexor 3+/5. Sensation intact to light touch in left lower extremities, Decreased sensory R Lat thigh and Left dital thumb. Patient is less distractible. Better insight and awareness.  Skin: clean and intact right thigh wound.   Assessment/Plan: 1. 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 Physiatrist is providing close team supervision and 24 hour management of active medical problems listed below. Physiatrist and rehab team continue to assess barriers to discharge/monitor patient progress toward functional and medical goals.  Working on [employer]. Placement tomorrow apparently  FIM: FIM - Bathing Bathing Steps Patient Completed: Chest;Right Arm;Left Arm;Abdomen;Front perineal area;Buttocks;Left lower leg (including foot);Right lower leg (including foot);Left upper leg;Right upper leg Bathing: 4: Steadying assist  FIM - Upper Body Dressing/Undressing Upper body dressing/undressing steps patient completed: Thread/unthread right sleeve of pullover shirt/dresss;Thread/unthread left sleeve of pullover shirt/dress;Put head through opening of pull over shirt/dress;Pull shirt over trunk Upper body dressing/undressing: 5: Set-up assist to: Obtain clothing/put away FIM - Lower Body Dressing/Undressing Lower body dressing/undressing steps patient completed: Thread/unthread left underwear leg;Thread/unthread right underwear leg;Pull underwear up/down;Thread/unthread right pants leg;Thread/unthread left  pants leg;Pull pants up/down;Don/Doff left shoe;Fasten/unfasten pants;Don/Doff left sock;Don/Doff right sock Lower body dressing/undressing: 4: Steadying Assist  FIM - Toileting Toileting steps completed by patient: Adjust clothing prior to toileting;Performs perineal hygiene;Adjust clothing after toileting Toileting  Assistive Devices: Grab bar or rail for support Toileting: 4: Steadying assist  FIM - Diplomatic Services operational officer Devices: Grab bars Toilet Transfers: 4-To toilet/BSC: Min A (steadying Pt. > 75%);4-From toilet/BSC: Min A (steadying Pt. > 75%)  FIM - Bed/Chair Transfer Bed/Chair Transfer Assistive Devices: Arm rests Bed/Chair Transfer: 5: Set-up assist to: Apply orthosis/W/C set-up;5: Supine > Sit: Supervision (verbal cues/safety issues);5: Sit > Supine: Supervision (verbal cues/safety issues);5: Bed > Chair or W/C: Supervision (verbal cues/safety issues);5: Chair or W/C > Bed: Supervision (verbal cues/safety issues)  FIM - Locomotion: Wheelchair Distance: 200 Locomotion: Wheelchair: 5: Travels 150 ft or more: maneuvers on rugs and over door sills with supervision, cueing or coaxing FIM - Locomotion: Ambulation Locomotion: Ambulation Assistive Devices: TEFL teacher Ambulation/Gait Assistance: 5: Supervision Locomotion: Ambulation: 5: Travels 150 ft or more with supervision/safety issues  Comprehension Comprehension Mode: Auditory;Visual Comprehension: 5-Follows basic conversation/direction: With no assist  Expression Expression Mode: Verbal Expression: 5-Expresses basic needs/ideas: With no assist  Social Interaction Social Interaction: 5-Interacts appropriately 90% of the time - Needs monitoring or encouragement for participation or interaction.  Problem Solving Problem Solving: 5-Solves basic 90% of the time/requires cueing < 10% of the time  Memory Memory: 4-Recognizes or recalls 75 - 89% of the time/requires cueing 10 - 24% of the time Medical Problem List and Plan:  1. Traumatic brain injury/ multitrauma  2. DVT Prophylaxis/Anticoagulation: Subcutaneous Lovenox. Monitor platelet counts any signs of bleeding  3. Pain Management: Ultram decreased to 50 mg every 6 hours (sedation) and oxycodone as needed for breakthrough pain. Tolerating therapy activities  at present. Pain control adequate 4. Neuropsych: This patient is not capable of making decisions on his own behalf.  5. Multiple facial fractures. Status post ORIF of right tripod fracture 12/04/2012    6. Right femur and acetabular as well as patella fracture. Status post ORIF right transverse anterior column with removal of broken hardware and intramedullary nail right femur 11/29/2012. Patient is touchdown weightbearing   -working on adherence to precautions  -improving sensation, nerve function in the right leg 7. Open fracture dislocation, left thumb MCP joint status post ORIF and pinning of fracture with ulnar collateral ligament repair reconstruction of left from 11/29/2012. Patient is nonweightbearing --sutures, cast changed thursday 8. Acute blood loss anemia. Latest hemoglobin up to 9.1 9. History of alcohol and drug abuse. counseling  10. Lethargy: resolved   11.  Left thumb numbness likely superficial radial nerve injury       LOS (Days) 21 A FACE TO FACE EVALUATION WAS PERFORMED  SWARTZ,ZACHARY T 12/26/2012, 9:30 AM

## 2012-12-26 NOTE — Progress Notes (Signed)
Social Work Patient ID: [name], male   DOB: 08/12/1965, 47 y.o.   MRN: [id number]  Have confirmed with pt and sister that they have accepted a bed offer from [facility] in [place], however, facility cannot admit until tomorrow.  Have alerted treatment team to postponement of today's d/c.  Plan for sister to take pt to facility tomorrow morning (around 9:30 am).    [name]

## 2012-12-27 NOTE — Progress Notes (Signed)
Social Work  Discharge Note  The overall goal for the admission was met for:   Discharge location: Yes - plan was for SNF upon admit as family could not provide 24/7 assistance  Length of Stay: No - delayed due to payor source (Medicaid pending) - LOS = 22 days  Discharge activity level: Yes - supervision to minimal assistance  Home/community participation: Yes  Services provided included: MD, RD, PT, OT, SLP, RN, TR, Pharmacy, Neuropsych and SW  Financial Services: Other: [employer] and [employer] apps pending  Follow-up services arranged: Other: SNF placement at [facility]  Comments (or additional information):  Patient/Family verbalized understanding of follow-up arrangements: Yes  Individual responsible for coordination of the follow-up plan: patient with mother/sister assist  Confirmed correct DME delivered: NA  Bosco Paparella, LCSW

## 2012-12-27 NOTE — Progress Notes (Signed)
Patient ID: [name], male   DOB: 1966/05/13, 47 y.o.   MRN: [id number] Subjective/Complaints: No new issues. Excited to leave today Pain a little more intense in the right leg after working hard in PT yesterday  Review of Systems otherwise negative     Objective: Vital Signs: Blood pressure 103/57, pulse 98, temperature 98.5 F (36.9 C), temperature source Oral, resp. rate 20, height 5\' 7"  (1.702 m), weight 62.4 kg (137 lb 9.1 oz), SpO2 98.00%. No results found. Results for orders placed during the hospital encounter of 12/05/12 (from the past 72 hour(s))  CREATININE, SERUM     Status: None   Collection Time    12/26/12  6:05 AM      Result Value Range   Creatinine, Ser 0.96  0.50 - 1.35 mg/dL   GFR calc non Af Amer >90  >90 mL/min   GFR calc Af Amer >90  >90 mL/min   Comment:            The eGFR has been calculated     using the CKD EPI equation.     This calculation has not been     validated in all clinical     situations.     eGFR's persistently     <90 mL/min signify     possible Chronic Kidney Disease.     Vitals reviewed.  HENT:  Head: Normocephalic.  Eyes:  Pupils reactive to light.  Neck: Normal range of motion. Neck supple. No thyromegaly present.  Cardiovascular: Normal rate and regular rhythm.  Pulmonary/Chest: Effort normal and breath sounds normal. No respiratory distress.  Abdominal: Soft. Bowel sounds are normal. He exhibits no distension.  Musculoskeletal:  Left thumb spica splint/cast in place  Neurological:   . He needed multiple cues for place and situation. .  motor strength limited exam secondary to left thumb spica cast and right thumb spica splint and right knee immobilizer.  5/5 bilateral deltoid bicep triceps and left hip flexor knee extensor ankle dorsiflexor plantar flexor  Right ankle dorsiflexor plantar flexor 3+/5. Sensation intact to light touch in left lower extremities, Decreased sensory R Lat thigh and Left dital thumb.  Patient is less distractible. Better insight and awareness.  Skin: clean and intact right thigh wound.   Assessment/Plan: 1. 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 Physiatrist is providing close team supervision and 24 hour management of active medical problems listed below. Physiatrist and rehab team continue to assess barriers to discharge/monitor patient progress toward functional and medical goals.  DC to facility today. Follow up with me in 4-6 prn  FIM: FIM - Bathing Bathing Steps Patient Completed: Chest;Right upper leg;Right Arm;Left upper leg;Left Arm;Left lower leg (including foot);Abdomen;Front perineal area;Buttocks Bathing: 4: Min-Patient completes 8-9 62f 10 parts or 75+ percent  FIM - Upper Body Dressing/Undressing Upper body dressing/undressing steps patient completed: Thread/unthread right sleeve of pullover shirt/dresss;Thread/unthread left sleeve of pullover shirt/dress;Put head through opening of pull over shirt/dress;Pull shirt over trunk Upper body dressing/undressing: 5: Set-up assist to: Obtain clothing/put away FIM - Lower Body Dressing/Undressing Lower body dressing/undressing steps patient completed: Thread/unthread right underwear leg;Thread/unthread right pants leg;Thread/unthread left pants leg;Thread/unthread left underwear leg;Pull underwear up/down;Pull pants up/down;Don/Doff left sock Lower body dressing/undressing: 4: Min-Patient completed 75 plus % of tasks  FIM - Toileting Toileting steps completed by patient: Adjust clothing prior to toileting;Performs perineal hygiene;Adjust  clothing after toileting Toileting Assistive Devices: Grab bar or rail for support Toileting: 0: Activity did not occur  FIM - Diplomatic Services operational officer Devices: Grab bars Toilet Transfers: 4-To  toilet/BSC: Min A (steadying Pt. > 75%);4-From toilet/BSC: Min A (steadying Pt. > 75%)  FIM - Bed/Chair Transfer Bed/Chair Transfer Assistive Devices: Arm rests Bed/Chair Transfer: 5: Chair or W/C > Bed: Supervision (verbal cues/safety issues);5: Bed > Chair or W/C: Supervision (verbal cues/safety issues)  FIM - Locomotion: Wheelchair Distance: 200 Locomotion: Wheelchair: 5: Travels 150 ft or more: maneuvers on rugs and over door sills with supervision, cueing or coaxing FIM - Locomotion: Ambulation Locomotion: Ambulation Assistive Devices: TEFL teacher Ambulation/Gait Assistance: 5: Supervision Locomotion: Ambulation: 5: Travels 150 ft or more with supervision/safety issues  Comprehension Comprehension Mode: Auditory Comprehension: 5-Follows basic conversation/direction: With extra time/assistive device  Expression Expression Mode: Verbal Expression: 5-Expresses basic needs/ideas: With extra time/assistive device  Social Interaction Social Interaction: 5-Interacts appropriately 90% of the time - Needs monitoring or encouragement for participation or interaction.  Problem Solving Problem Solving: 5-Solves basic 90% of the time/requires cueing < 10% of the time  Memory Memory: 4-Recognizes or recalls 75 - 89% of the time/requires cueing 10 - 24% of the time Medical Problem List and Plan:  1. Traumatic brain injury/ multitrauma  2. DVT Prophylaxis/Anticoagulation: Subcutaneous Lovenox. Monitor platelet counts any signs of bleeding  3. Pain Management: Ultram decreased to 50 mg every 6 hours (sedation) and oxycodone as needed for breakthrough pain. Tolerating therapy activities at present. Pain control adequate 4. Neuropsych: This patient is not capable of making decisions on his own behalf.  5. Multiple facial fractures. Status post ORIF of right tripod fracture 12/04/2012    6. Right femur and acetabular as well as patella fracture. Status post ORIF right transverse anterior  column with removal of broken hardware and intramedullary nail right femur 11/29/2012. Patient is touchdown weightbearing   -working on adherence to precautions  -improving sensation, nerve function in the right leg 7. Open fracture dislocation, left thumb MCP joint status post ORIF and pinning of fracture with ulnar collateral ligament repair reconstruction of left from 11/29/2012. Patient is nonweightbearing --sutures, cast changed thursday 8. Acute blood loss anemia. Latest hemoglobin up to 9.1 9. History of alcohol and drug abuse. counseling  10. Lethargy: resolved   11.  Left thumb numbness likely superficial radial nerve injury       LOS (Days) 22 A FACE TO FACE EVALUATION WAS PERFORMED  SWARTZ,ZACHARY T 12/27/2012, 9:58 AM

## 2013-02-25 ENCOUNTER — Ambulatory Visit: Payer: Medicaid Other

## 2013-02-25 ENCOUNTER — Ambulatory Visit: Payer: Medicaid Other | Attending: Orthopedic Surgery

## 2013-02-25 DIAGNOSIS — R262 Difficulty in walking, not elsewhere classified: Secondary | ICD-10-CM | POA: Insufficient documentation

## 2013-02-25 DIAGNOSIS — M2550 Pain in unspecified joint: Secondary | ICD-10-CM | POA: Insufficient documentation

## 2013-02-25 DIAGNOSIS — M6281 Muscle weakness (generalized): Secondary | ICD-10-CM | POA: Insufficient documentation

## 2013-02-25 DIAGNOSIS — M2560 Stiffness of unspecified joint, not elsewhere classified: Secondary | ICD-10-CM | POA: Insufficient documentation

## 2013-02-25 DIAGNOSIS — IMO0001 Reserved for inherently not codable concepts without codable children: Secondary | ICD-10-CM | POA: Insufficient documentation

## 2013-03-12 ENCOUNTER — Other Ambulatory Visit (HOSPITAL_COMMUNITY): Payer: Self-pay | Admitting: Orthopedic Surgery

## 2013-03-12 ENCOUNTER — Encounter (HOSPITAL_COMMUNITY): Payer: Self-pay

## 2013-03-12 DIAGNOSIS — M7989 Other specified soft tissue disorders: Secondary | ICD-10-CM

## 2013-03-12 DIAGNOSIS — M25561 Pain in right knee: Secondary | ICD-10-CM

## 2013-03-13 ENCOUNTER — Ambulatory Visit (HOSPITAL_COMMUNITY)
Admission: RE | Admit: 2013-03-13 | Discharge: 2013-03-13 | Disposition: A | Discharge status: Home / Self Care | Payer: Medicaid Other | Source: Ambulatory Visit | Attending: Orthopedic Surgery | Admitting: Orthopedic Surgery

## 2013-03-13 DIAGNOSIS — M79609 Pain in unspecified limb: Secondary | ICD-10-CM

## 2013-03-13 DIAGNOSIS — M7989 Other specified soft tissue disorders: Secondary | ICD-10-CM | POA: Insufficient documentation

## 2013-03-13 DIAGNOSIS — Z96649 Presence of unspecified artificial hip joint: Secondary | ICD-10-CM | POA: Insufficient documentation

## 2013-03-13 DIAGNOSIS — M25561 Pain in right knee: Secondary | ICD-10-CM

## 2013-03-13 NOTE — Progress Notes (Signed)
VASCULAR LAB PRELIMINARY  PRELIMINARY  PRELIMINARY  PRELIMINARY  Right lower extremity venous duplex completed.    Preliminary report:  Right:  No evidence of DVT, superficial thrombosis, or Baker's cyst.  Kashira Behunin, RVS 03/13/2013, 12:11 PM

## 2014-07-06 IMAGING — CT CT MAXILLOFACIAL W/O CM
3 series · 14 of 47 positions shown, 16 images · non-contrast
Comparison: CT scan dated 11/28/2012

CLINICAL DATA: Facial fractures.

CT MAXILLOFACIAL WITHOUT CONTRAST
TECHNIQUE: Multidetector CT imaging of the maxillofacial
structures was performed. Multiplanar CT image reconstructions were
also generated.

[Series 2: coronal st · coronal · 0.35mm/px · 3 of 86 slices shown]
[im 29/86  bone]
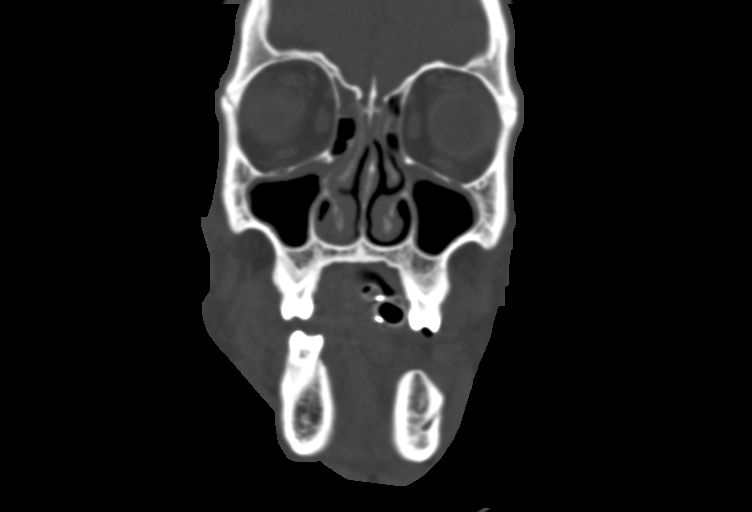
[im 38/86  bone]
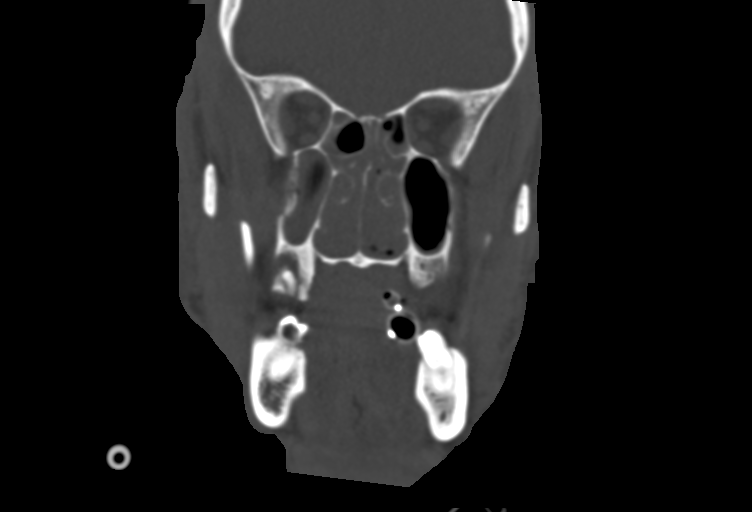
[im 48/86  bone]
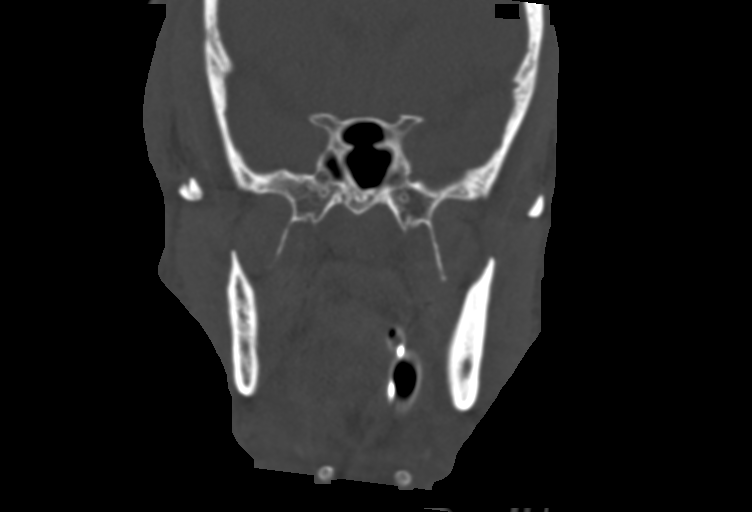

[Series 3: sagittal st · sagittal · 0.39mm/px · 3 of 75 slices shown]
[im 25/75  bone]
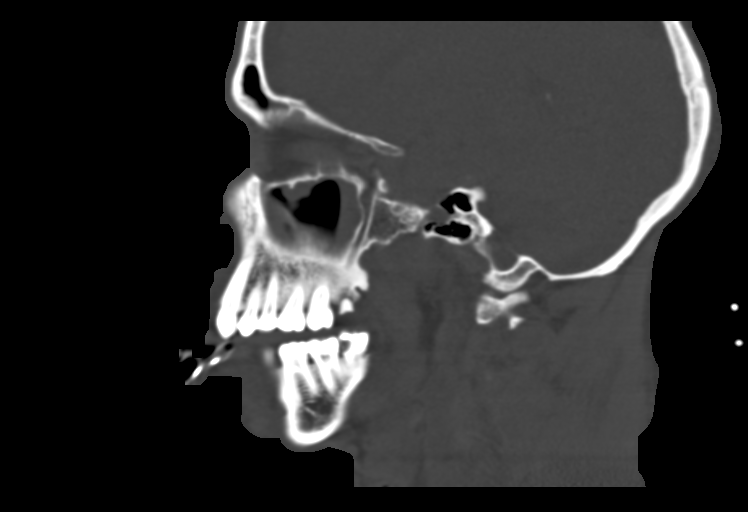
[im 38/75  bone]
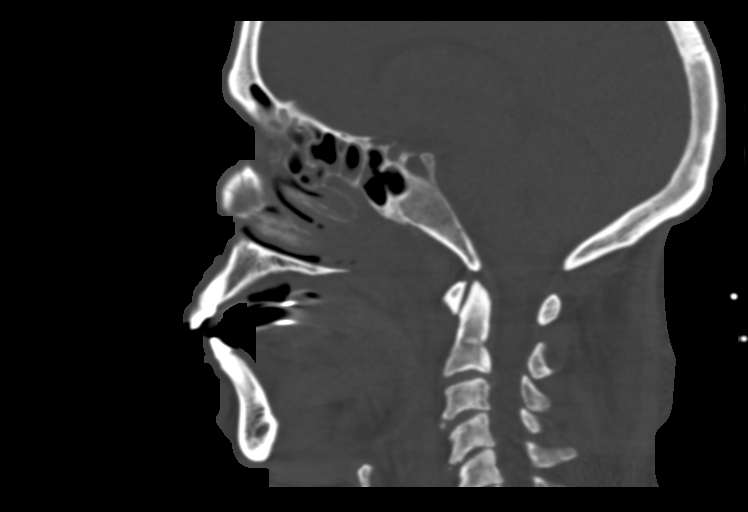
[im 50/75  bone]
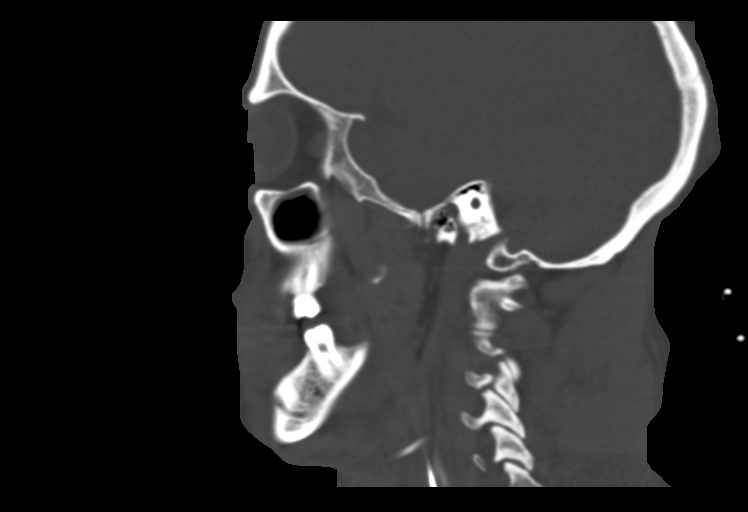

[Series 6: facial/ orbits 2.0 h60s · axial · 0.31mm/px · z∈[-342,-188]mm · 8 of 91 slices shown, 10 images]
[im 7/91  brain]
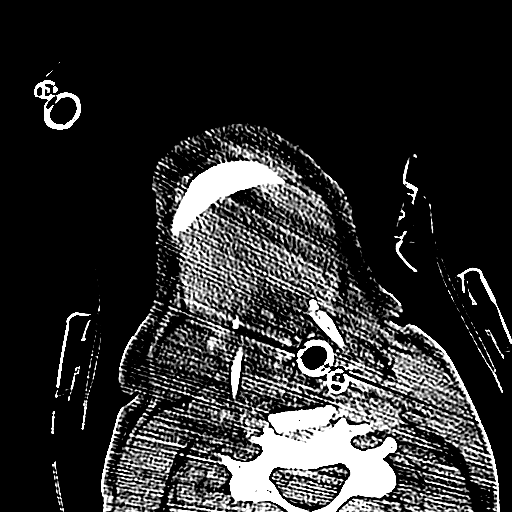
[im 7/91  bone]
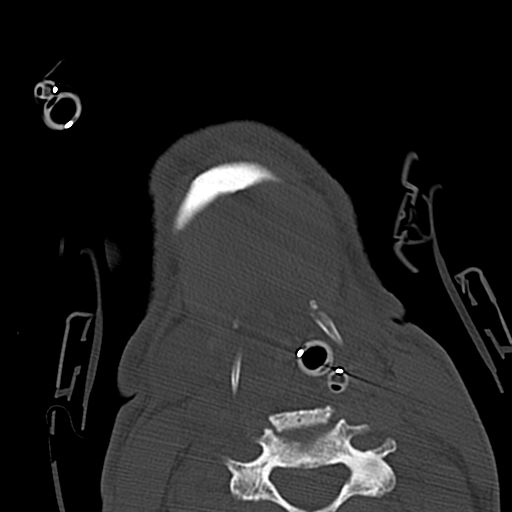
[im 19/91  bone]
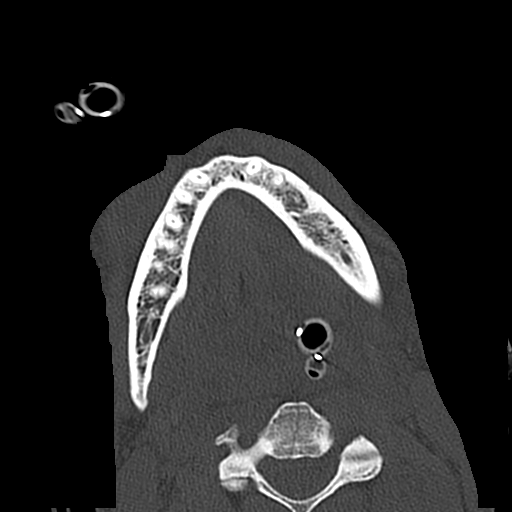
[im 28/91  bone]
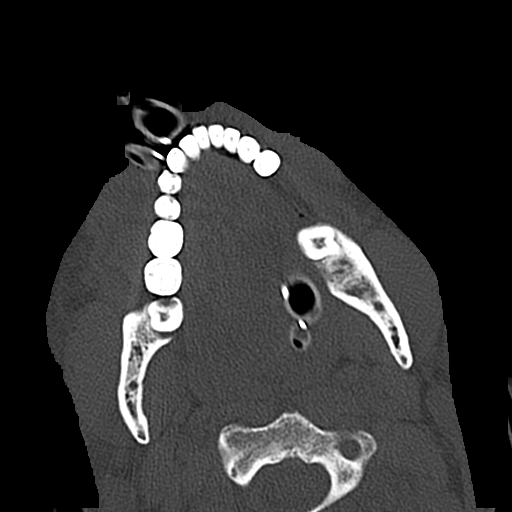
[im 41/91  bone]
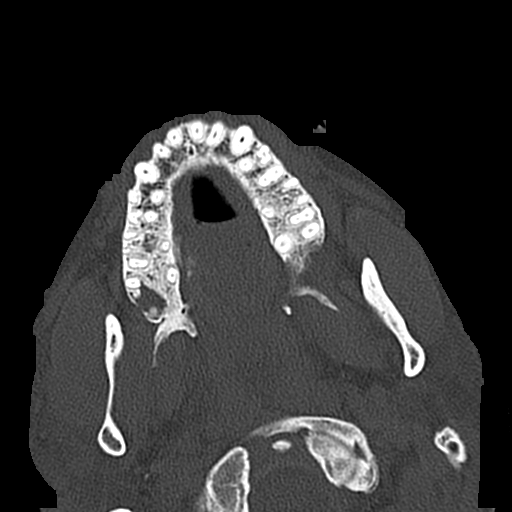
[im 50/91  brain]
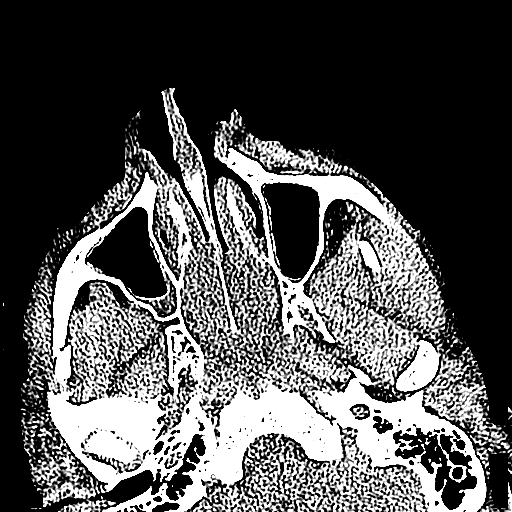
[im 50/91  bone]
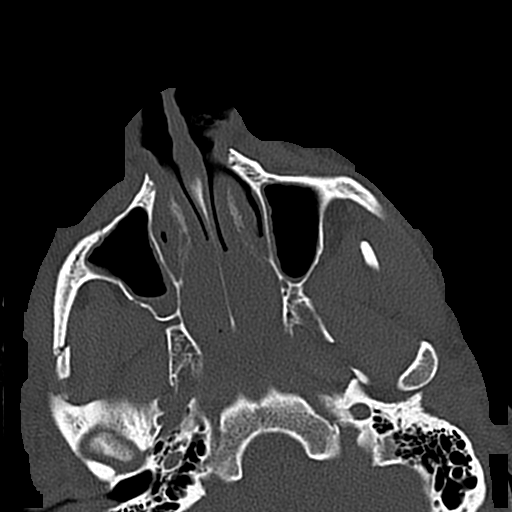
[im 63/91  bone]
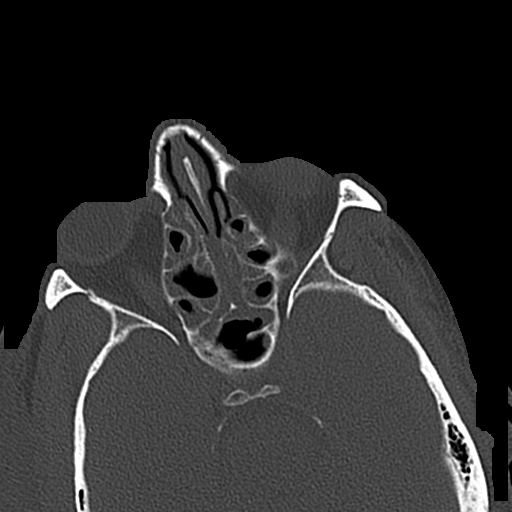
[im 72/91  bone]
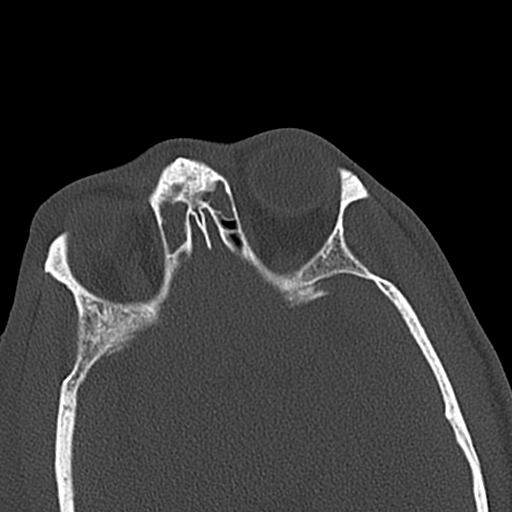
[im 84/91  bone]
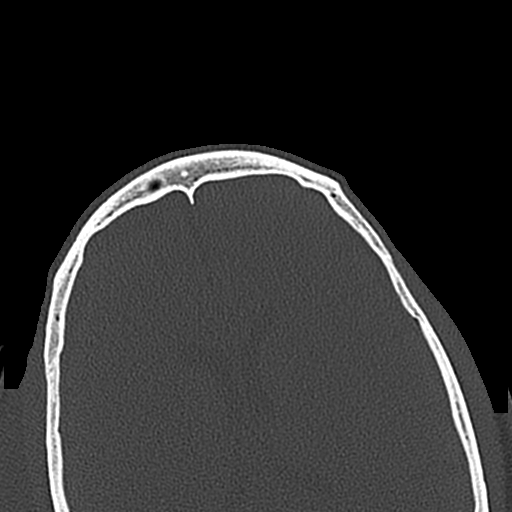

[14 of 47 positions shown; findings below may reference images not displayed]

FINDINGS: There is a fracture through the anterior wall of the
right maxillary sinus extending into the lateral aspect of the
floor of the right orbit.  There is also disruption of the right
zygomatofrontal suture and a two-part fracture of the posterior
aspect of the right zygomatic arch.  There is also a buckle
fracture of the posterior lateral wall of the right maxillary
sinus.  Small air-fluid level in the right maxillary sinus.

No herniation of orbital contents into the sinus.

The other facial bones are intact.
IMPRESSION: Fractures of the right zygomatic arch and right maxillary sinus and
floor of the right orbit as described.

## 2014-07-06 IMAGING — DX DG KNEE 1-2V PORT*R*
2 series · 2 of 2 positions shown · non-contrast
Comparison: Radiographs dated 11/28/2012

CLINICAL DATA: Right femur fracture.

PORTABLE RIGHT KNEE - 1-2 VIEW

[ap]
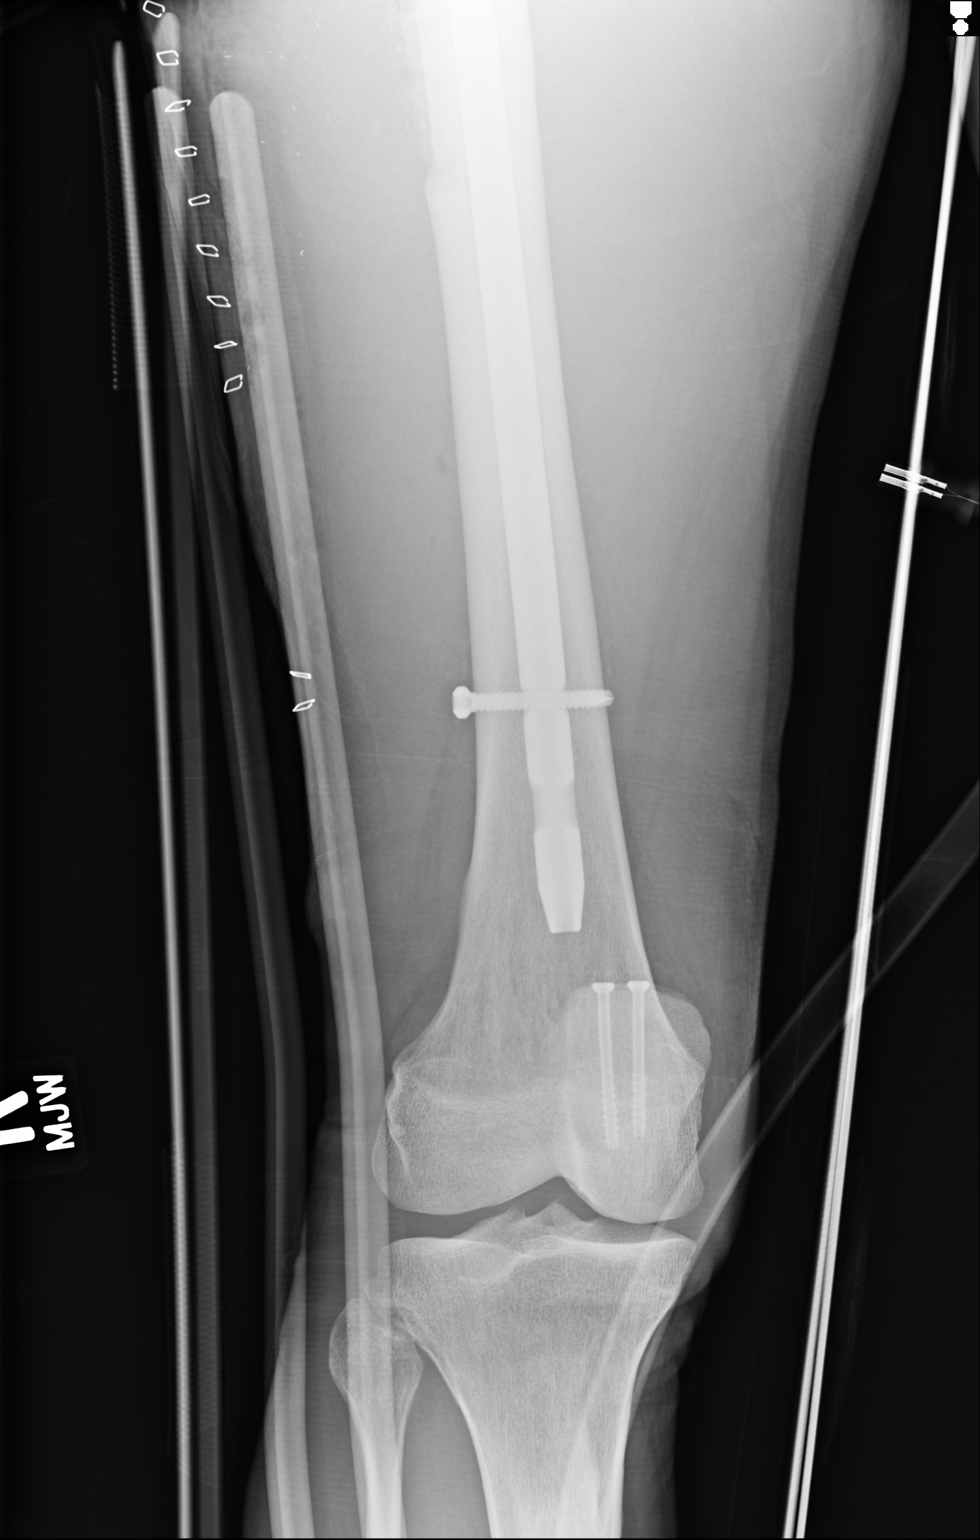

[lat]
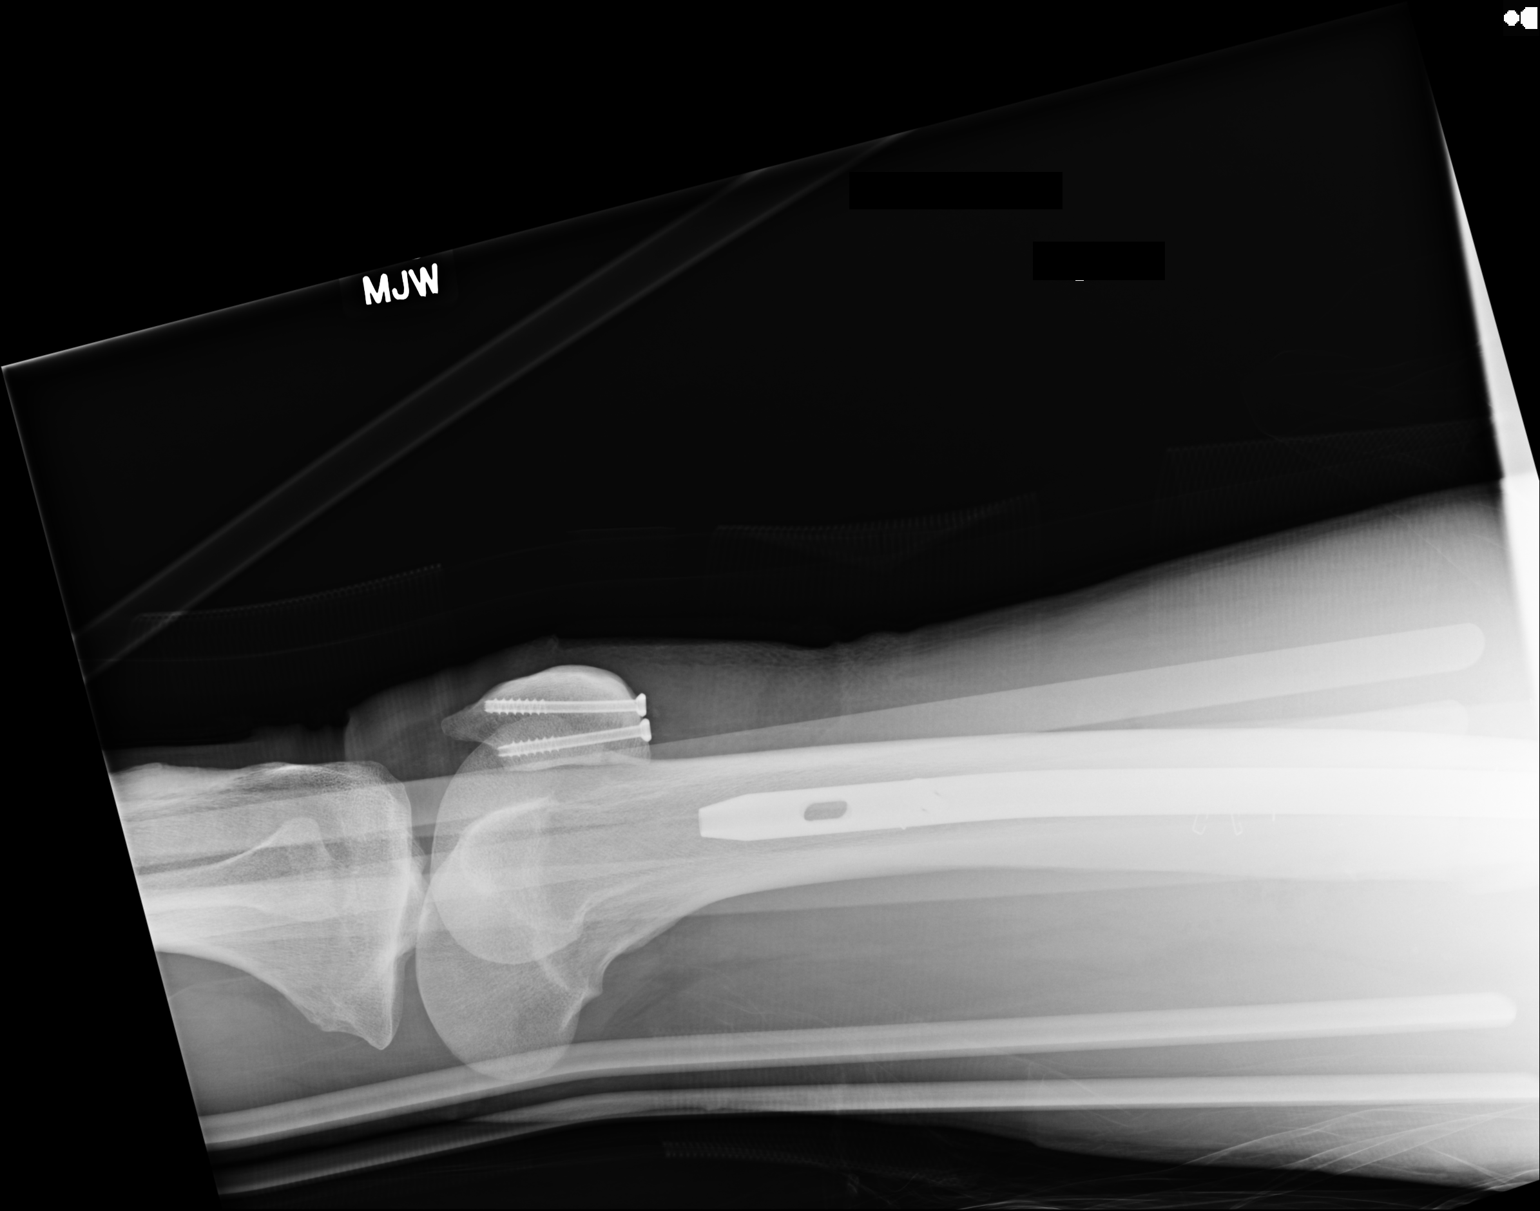

[2 of 2 positions shown; findings below may reference images not displayed]

FINDINGS: Intramedullary rod and fixation screw are seen in the
femoral shaft.  Two screws are seen in the patella.  No acute
abnormality of the knee.
IMPRESSION: Postoperative changes as described.

## 2014-07-09 IMAGING — CR DG HAND COMPLETE 3+V*R*
3 series · 3 of 3 positions shown · non-contrast
Comparison: None

CLINICAL DATA: Pain and laxity of first metacarpal phalangeal
joint.

RIGHT HAND - COMPLETE 3+ VIEW

[x hand obl right]
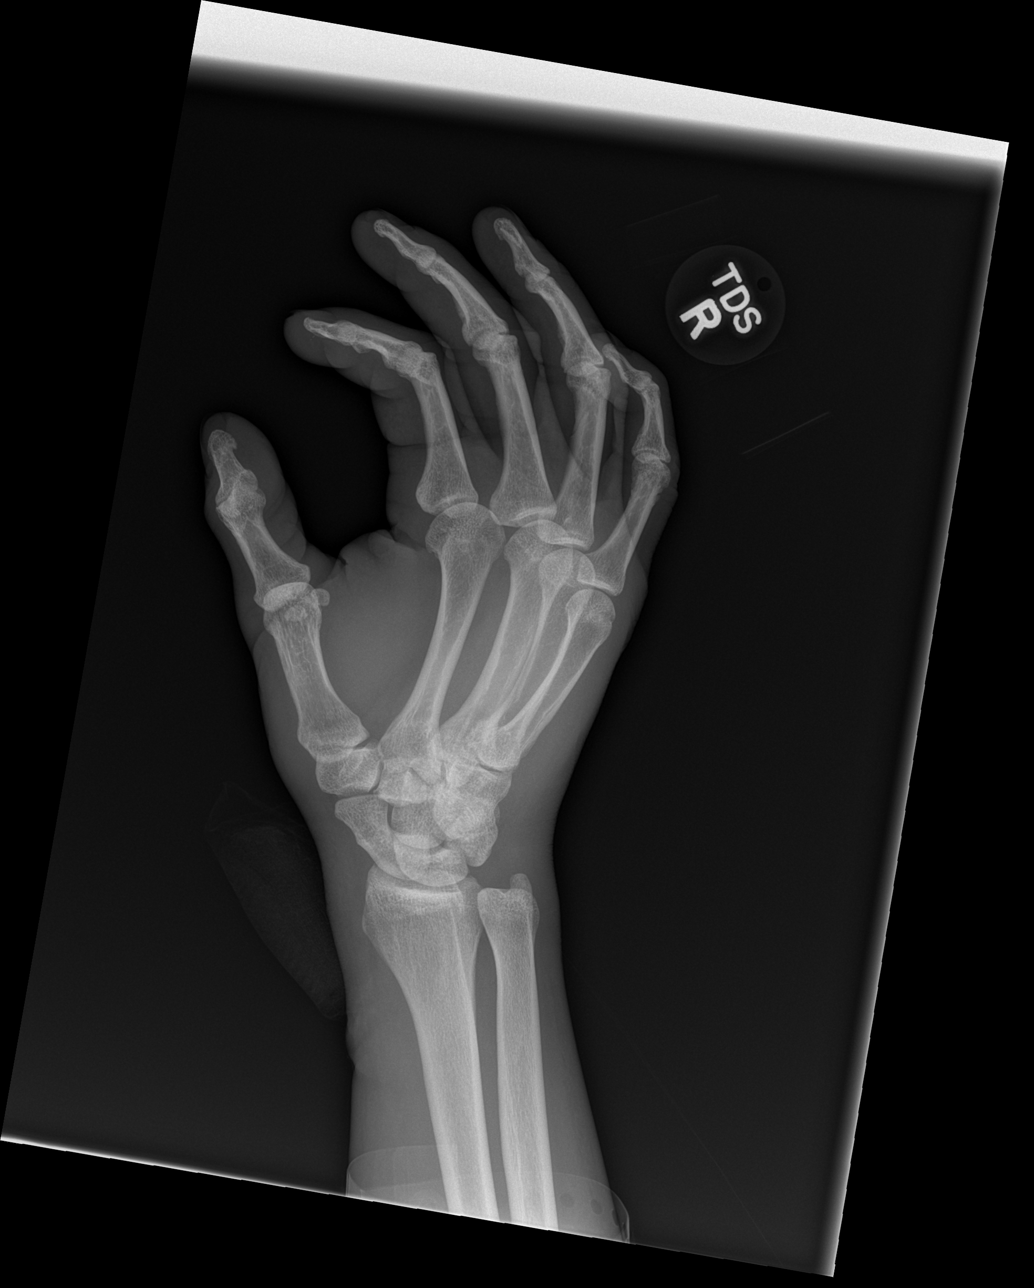

[x hand pa right]
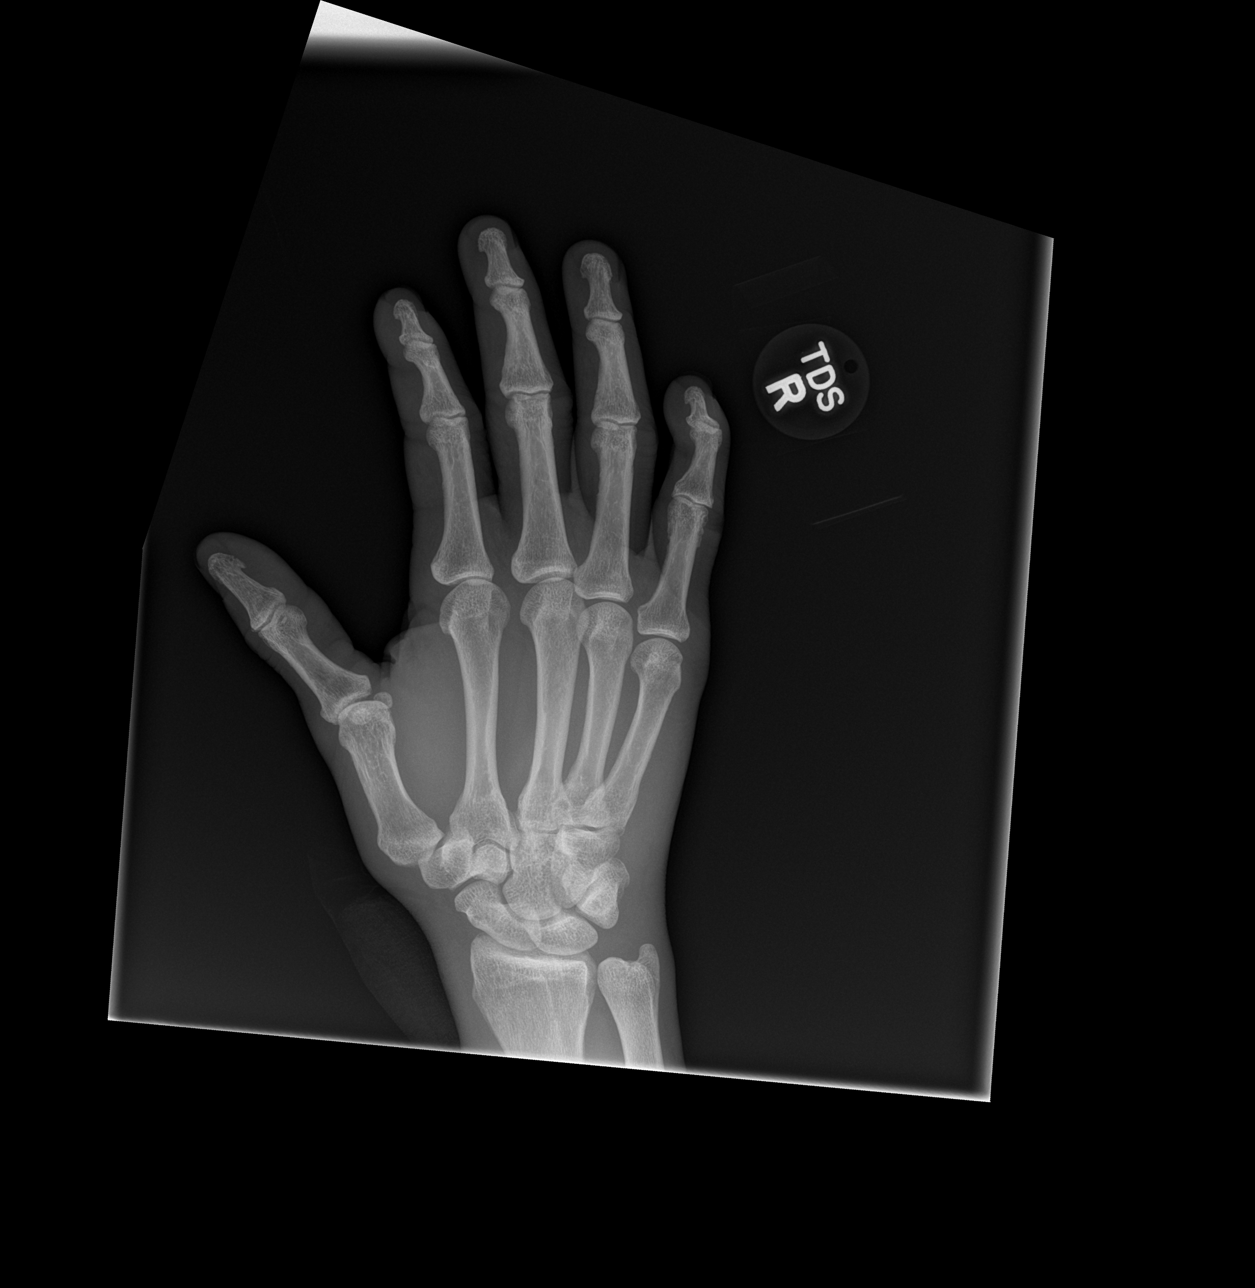

[x hand lat right]
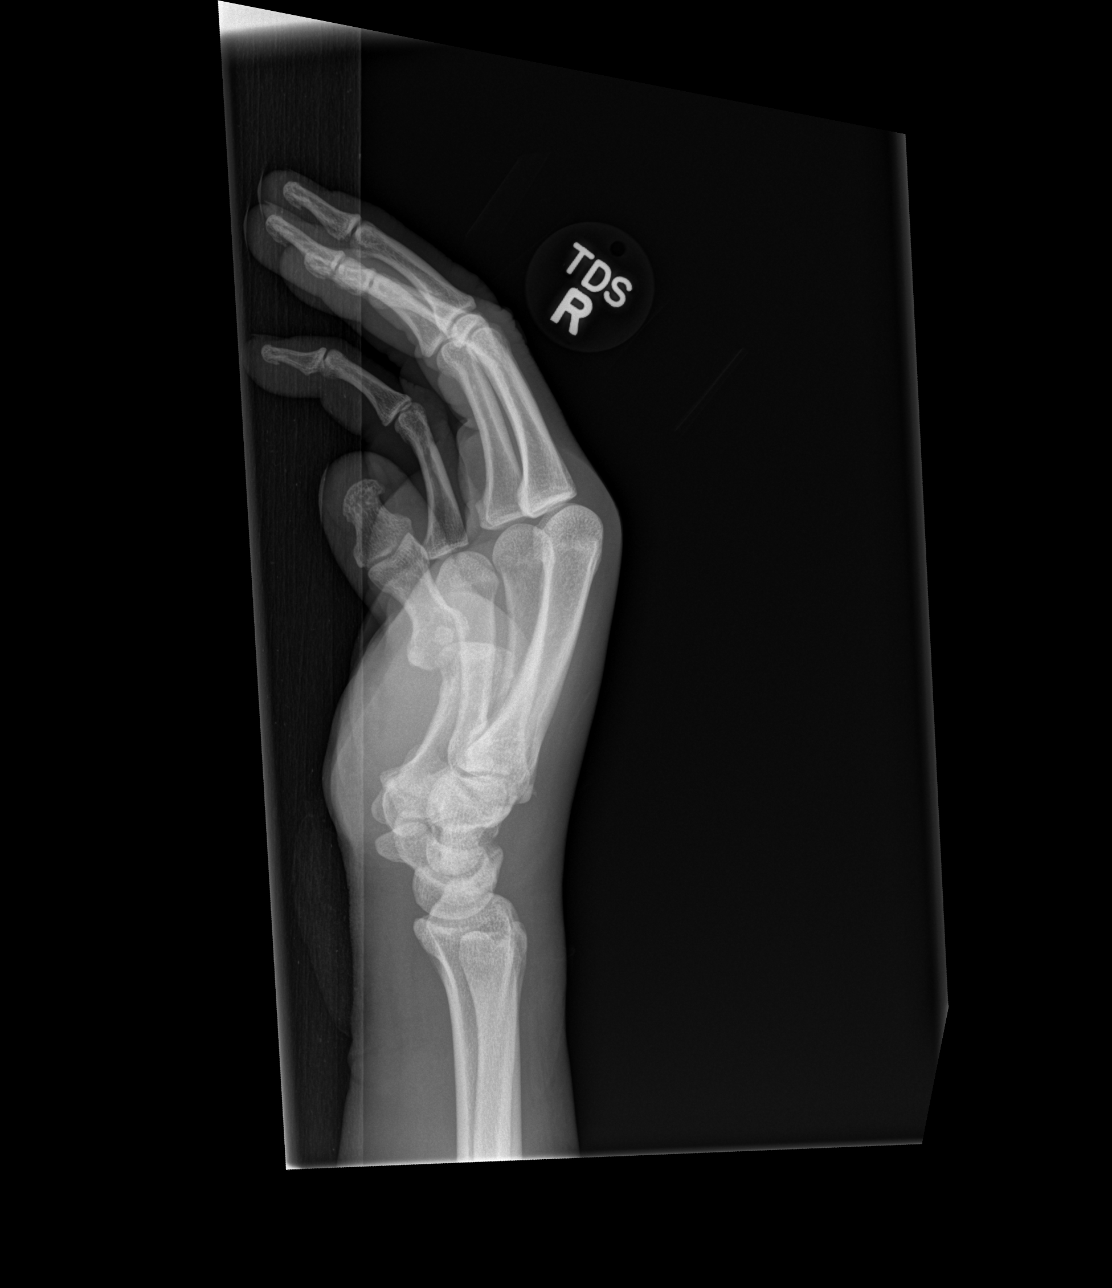

[3 of 3 positions shown; findings below may reference images not displayed]

FINDINGS: No fractures or subluxations identified.  There is no
evidence of arthropathy or other focal bone abnormality.  Soft
tissues are unremarkable.
IMPRESSION: No acute findings.

If there is a concern for ligamentous injury then MRI arthrography
would be recommended.

## 2014-08-06 ENCOUNTER — Encounter (HOSPITAL_COMMUNITY): Payer: Self-pay | Admitting: Emergency Medicine

## 2014-08-06 ENCOUNTER — Emergency Department (HOSPITAL_COMMUNITY)
Admission: EM | Admit: 2014-08-06 | Discharge: 2014-08-06 | Disposition: A | Discharge status: Home / Self Care | Payer: Medicaid Other | Attending: Emergency Medicine | Admitting: Emergency Medicine

## 2014-08-06 DIAGNOSIS — L02212 Cutaneous abscess of back [any part, except buttock]: Secondary | ICD-10-CM | POA: Insufficient documentation

## 2014-08-06 DIAGNOSIS — Z8719 Personal history of other diseases of the digestive system: Secondary | ICD-10-CM | POA: Diagnosis not present

## 2014-08-06 DIAGNOSIS — Z720 Tobacco use: Secondary | ICD-10-CM | POA: Insufficient documentation

## 2014-08-06 DIAGNOSIS — Z87828 Personal history of other (healed) physical injury and trauma: Secondary | ICD-10-CM | POA: Diagnosis not present

## 2014-08-06 DIAGNOSIS — L0291 Cutaneous abscess, unspecified: Secondary | ICD-10-CM

## 2014-08-06 MED ORDER — HYDROCODONE-ACETAMINOPHEN 5-325 MG PO TABS
1.0000 | ORAL_TABLET | Freq: Once | ORAL | Status: AC
  Administered 2014-08-06: 1 via ORAL
  Filled 2014-08-06: qty 1

## 2014-08-06 MED ORDER — LIDOCAINE-EPINEPHRINE 2 %-1:100000 IJ SOLN
20.0000 mL | Freq: Once | INTRAMUSCULAR | Status: AC
  Administered 2014-08-06: 20 mL
  Filled 2014-08-06: qty 1

## 2014-08-06 MED ORDER — LIDOCAINE-EPINEPHRINE (PF) 2 %-1:200000 IJ SOLN
10.0000 mL | Freq: Once | INTRAMUSCULAR | Status: DC
Start: 2014-08-06 — End: 2014-08-06

## 2014-08-06 MED ORDER — NAPROXEN 500 MG PO TABS: 500.0000 mg | ORAL_TABLET | Freq: Two times a day (BID) | ORAL | Status: DC

## 2014-08-06 NOTE — ED Notes (Signed)
Pt c/o abscess to lt low back x 4 days.

## 2014-08-06 NOTE — Discharge Instructions (Signed)
Abscess °An abscess is an infected area that contains a collection of pus and debris. It can occur in almost any part of the body. An abscess is also known as a furuncle or boil. °CAUSES  °An abscess occurs when tissue gets infected. This can occur from blockage of oil or sweat glands, infection of hair follicles, or a minor injury to the skin. As the body tries to fight the infection, pus collects in the area and creates pressure under the skin. This pressure causes pain. People with weakened immune systems have difficulty fighting infections and get certain abscesses more often.  °SYMPTOMS °Usually an abscess develops on the skin and becomes a painful mass that is red, warm, and tender. If the abscess forms under the skin, you may feel a moveable soft area under the skin. Some abscesses break open (rupture) on their own, but most will continue to get worse without care. The infection can spread deeper into the body and eventually into the bloodstream, causing you to feel ill.  °DIAGNOSIS  °Your caregiver will take your medical history and perform a physical exam. A sample of fluid may also be taken from the abscess to determine what is causing your infection. °TREATMENT  °Your caregiver may prescribe antibiotic medicines to fight the infection. However, taking antibiotics alone usually does not cure an abscess. Your caregiver may need to make a small cut (incision) in the abscess to drain the pus. In some cases, gauze is packed into the abscess to reduce pain and to continue draining the area. °HOME CARE INSTRUCTIONS  °· Only take over-the-counter or prescription medicines for pain, discomfort, or fever as directed by your caregiver. °· If you were prescribed antibiotics, take them as directed. Finish them even if you start to feel better. °· If gauze is used, follow your caregiver's directions for changing the gauze. °· To avoid spreading the infection: °· Keep your draining abscess covered with a  bandage. °· Wash your hands well. °· Do not share personal care items, towels, or whirlpools with others. °· Avoid skin contact with others. °· Keep your skin and clothes clean around the abscess. °· Keep all follow-up appointments as directed by your caregiver. °SEEK MEDICAL CARE IF:  °· You have increased pain, swelling, redness, fluid drainage, or bleeding. °· You have muscle aches, chills, or a general ill feeling. °· You have a fever. °MAKE SURE YOU:  °· Understand these instructions. °· Will watch your condition. °· Will get help right away if you are not doing well or get worse. °Document Released: 02/22/2005 Document Revised: 11/14/2011 Document Reviewed: 07/28/2011 °[employer]® Patient Information ©2015 [employer]. This information is not intended to replace advice given to you by your health care provider. Make sure you discuss any questions you have with your health care provider. ° °Abscess °Care After °An abscess (also called a boil or furuncle) is an infected area that contains a collection of pus. Signs and symptoms of an abscess include pain, tenderness, redness, or hardness, or you may feel a moveable soft area under your skin. An abscess can occur anywhere in the body. The infection may spread to surrounding tissues causing cellulitis. A cut (incision) by the surgeon was made over your abscess and the pus was drained out. Gauze may have been packed into the space to provide a drain that will allow the cavity to heal from the inside outwards. The boil may be painful for 5 to 7 days. Most people with a boil do not have   high fevers. Your abscess, if seen early, may not have localized, and may not have been lanced. If not, another appointment may be required for this if it does not get better on its own or with medications. °HOME CARE INSTRUCTIONS  °· Only take over-the-counter or prescription medicines for pain, discomfort, or fever as directed by your caregiver. °· When you bathe, soak and then  remove gauze or iodoform packs at least daily or as directed by your caregiver. You may then wash the wound gently with mild soapy water. Repack with gauze or do as your caregiver directs. °SEEK IMMEDIATE MEDICAL CARE IF:  °· You develop increased pain, swelling, redness, drainage, or bleeding in the wound site. °· You develop signs of generalized infection including muscle aches, chills, fever, or a general ill feeling. °· An oral temperature above 102° F (38.9° C) develops, not controlled by medication. °See your caregiver for a recheck if you develop any of the symptoms described above. If medications (antibiotics) were prescribed, take them as directed. °Document Released: 12/01/2004 Document Revised: 08/07/2011 Document Reviewed: 07/29/2007 °[employer]® Patient Information ©2015 [employer]. This information is not intended to replace advice given to you by your health care provider. Make sure you discuss any questions you have with your health care provider. ° °
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
## 2014-08-11 ENCOUNTER — Telehealth (HOSPITAL_BASED_OUTPATIENT_CLINIC_OR_DEPARTMENT_OTHER): Payer: Self-pay | Admitting: Emergency Medicine

## 2014-09-19 ENCOUNTER — Encounter (HOSPITAL_COMMUNITY): Payer: Self-pay | Admitting: Nurse Practitioner

## 2014-09-19 ENCOUNTER — Emergency Department (HOSPITAL_COMMUNITY)
Admission: EM | Admit: 2014-09-19 | Discharge: 2014-09-19 | Disposition: A | Discharge status: Home / Self Care | Payer: Medicaid Other | Attending: Emergency Medicine | Admitting: Emergency Medicine

## 2014-09-19 DIAGNOSIS — Y998 Other external cause status: Secondary | ICD-10-CM | POA: Diagnosis not present

## 2014-09-19 DIAGNOSIS — Y9389 Activity, other specified: Secondary | ICD-10-CM | POA: Insufficient documentation

## 2014-09-19 DIAGNOSIS — Z87828 Personal history of other (healed) physical injury and trauma: Secondary | ICD-10-CM | POA: Insufficient documentation

## 2014-09-19 DIAGNOSIS — S0081XA Abrasion of other part of head, initial encounter: Secondary | ICD-10-CM | POA: Diagnosis not present

## 2014-09-19 DIAGNOSIS — Z8719 Personal history of other diseases of the digestive system: Secondary | ICD-10-CM | POA: Insufficient documentation

## 2014-09-19 DIAGNOSIS — Y9289 Other specified places as the place of occurrence of the external cause: Secondary | ICD-10-CM | POA: Insufficient documentation

## 2014-09-19 DIAGNOSIS — Z720 Tobacco use: Secondary | ICD-10-CM | POA: Diagnosis not present

## 2014-09-19 DIAGNOSIS — S0181XA Laceration without foreign body of other part of head, initial encounter: Secondary | ICD-10-CM | POA: Diagnosis present

## 2014-09-19 NOTE — ED Notes (Signed)
Pt refused discharge vitals or give pain level
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
## 2014-09-19 NOTE — Discharge Instructions (Signed)

## 2014-11-23 ENCOUNTER — Emergency Department (HOSPITAL_COMMUNITY)
Admission: EM | Admit: 2014-11-23 | Discharge: 2014-11-23 | Disposition: A | Discharge status: Home / Self Care | Payer: Medicaid Other | Attending: Emergency Medicine | Admitting: Emergency Medicine

## 2014-11-23 ENCOUNTER — Encounter (HOSPITAL_COMMUNITY): Payer: Self-pay | Admitting: Family Medicine

## 2014-11-23 DIAGNOSIS — Y998 Other external cause status: Secondary | ICD-10-CM | POA: Insufficient documentation

## 2014-11-23 DIAGNOSIS — Y9389 Activity, other specified: Secondary | ICD-10-CM | POA: Diagnosis not present

## 2014-11-23 DIAGNOSIS — T25221A Burn of second degree of right foot, initial encounter: Secondary | ICD-10-CM

## 2014-11-23 DIAGNOSIS — Z8719 Personal history of other diseases of the digestive system: Secondary | ICD-10-CM | POA: Insufficient documentation

## 2014-11-23 DIAGNOSIS — Z720 Tobacco use: Secondary | ICD-10-CM | POA: Diagnosis not present

## 2014-11-23 DIAGNOSIS — T23061A Burn of unspecified degree of back of right hand, initial encounter: Secondary | ICD-10-CM | POA: Insufficient documentation

## 2014-11-23 DIAGNOSIS — Y9289 Other specified places as the place of occurrence of the external cause: Secondary | ICD-10-CM | POA: Diagnosis not present

## 2014-11-23 DIAGNOSIS — Z23 Encounter for immunization: Secondary | ICD-10-CM | POA: Diagnosis not present

## 2014-11-23 DIAGNOSIS — T25021A Burn of unspecified degree of right foot, initial encounter: Secondary | ICD-10-CM | POA: Diagnosis present

## 2014-11-23 DIAGNOSIS — Z791 Long term (current) use of non-steroidal anti-inflammatories (NSAID): Secondary | ICD-10-CM | POA: Diagnosis not present

## 2014-11-23 DIAGNOSIS — X12XXXA Contact with other hot fluids, initial encounter: Secondary | ICD-10-CM | POA: Insufficient documentation

## 2014-11-23 DIAGNOSIS — T23001A Burn of unspecified degree of right hand, unspecified site, initial encounter: Secondary | ICD-10-CM

## 2014-11-23 DIAGNOSIS — Z87828 Personal history of other (healed) physical injury and trauma: Secondary | ICD-10-CM | POA: Diagnosis not present

## 2014-11-23 MED ORDER — SILVER SULFADIAZINE 1 % EX CREA
TOPICAL_CREAM | Freq: Once | CUTANEOUS | Status: AC
  Administered 2014-11-23: 17:00:00 via TOPICAL
  Filled 2014-11-23: qty 85

## 2014-11-23 MED ORDER — TETANUS-DIPHTH-ACELL PERTUSSIS 5-2.5-18.5 LF-MCG/0.5 IM SUSP
0.5000 mL | Freq: Once | INTRAMUSCULAR | Status: AC
  Administered 2014-11-23: 0.5 mL via INTRAMUSCULAR
  Filled 2014-11-23: qty 0.5

## 2014-11-23 MED ORDER — OXYCODONE-ACETAMINOPHEN 5-325 MG PO TABS
1.0000 | ORAL_TABLET | Freq: Once | ORAL | Status: AC
  Administered 2014-11-23: 1 via ORAL

## 2014-11-23 MED ORDER — OXYCODONE-ACETAMINOPHEN 5-325 MG PO TABS: 1.0000 | ORAL_TABLET | Freq: Four times a day (QID) | ORAL | Status: DC | PRN

## 2014-11-23 MED ORDER — OXYCODONE-ACETAMINOPHEN 5-325 MG PO TABS
ORAL_TABLET | ORAL | Status: AC
  Filled 2014-11-23: qty 1

## 2014-11-23 MED ORDER — NAPROXEN 500 MG PO TABS: 500.0000 mg | ORAL_TABLET | Freq: Two times a day (BID) | ORAL | Status: DC | PRN

## 2014-11-23 NOTE — ED Notes (Signed)
Declined W/C at D/C and was escorted to lobby by RN. 
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
## 2015-07-15 ENCOUNTER — Encounter (HOSPITAL_COMMUNITY): Payer: Self-pay | Admitting: Vascular Surgery

## 2015-07-15 ENCOUNTER — Emergency Department (HOSPITAL_COMMUNITY)
Admission: EM | Admit: 2015-07-15 | Discharge: 2015-07-15 | Disposition: A | Discharge status: Home / Self Care | Payer: Medicaid Other | Attending: Emergency Medicine | Admitting: Emergency Medicine

## 2015-07-15 ENCOUNTER — Emergency Department (HOSPITAL_COMMUNITY): Payer: Medicaid Other

## 2015-07-15 DIAGNOSIS — Z87828 Personal history of other (healed) physical injury and trauma: Secondary | ICD-10-CM | POA: Diagnosis not present

## 2015-07-15 DIAGNOSIS — Z791 Long term (current) use of non-steroidal anti-inflammatories (NSAID): Secondary | ICD-10-CM | POA: Diagnosis not present

## 2015-07-15 DIAGNOSIS — Z8719 Personal history of other diseases of the digestive system: Secondary | ICD-10-CM | POA: Diagnosis not present

## 2015-07-15 DIAGNOSIS — J111 Influenza due to unidentified influenza virus with other respiratory manifestations: Secondary | ICD-10-CM | POA: Insufficient documentation

## 2015-07-15 DIAGNOSIS — R69 Illness, unspecified: Secondary | ICD-10-CM

## 2015-07-15 DIAGNOSIS — R0981 Nasal congestion: Secondary | ICD-10-CM | POA: Diagnosis present

## 2015-07-15 LAB — COMPREHENSIVE METABOLIC PANEL
ALT: 15 U/L — ABNORMAL LOW (ref 17–63)
ANION GAP: 10 (ref 5–15)
AST: 21 U/L (ref 15–41)
Albumin: 3.4 g/dL — ABNORMAL LOW (ref 3.5–5.0)
Alkaline Phosphatase: 42 U/L (ref 38–126)
BUN: 15 mg/dL (ref 6–20)
CHLORIDE: 110 mmol/L (ref 101–111)
CO2: 22 mmol/L (ref 22–32)
Calcium: 8.8 mg/dL — ABNORMAL LOW (ref 8.9–10.3)
Creatinine, Ser: 1.16 mg/dL (ref 0.61–1.24)
Glucose, Bld: 105 mg/dL — ABNORMAL HIGH (ref 65–99)
POTASSIUM: 3.9 mmol/L (ref 3.5–5.1)
Sodium: 142 mmol/L (ref 135–145)
Total Bilirubin: 0.6 mg/dL (ref 0.3–1.2)
Total Protein: 6.1 g/dL — ABNORMAL LOW (ref 6.5–8.1)

## 2015-07-15 LAB — CBC
HEMATOCRIT: 42.5 % (ref 39.0–52.0)
Hemoglobin: 13.7 g/dL (ref 13.0–17.0)
MCH: 28.7 pg (ref 26.0–34.0)
MCHC: 32.2 g/dL (ref 30.0–36.0)
MCV: 89.1 fL (ref 78.0–100.0)
PLATELETS: 244 10*3/uL (ref 150–400)
RBC: 4.77 MIL/uL (ref 4.22–5.81)
RDW: 13.4 % (ref 11.5–15.5)
WBC: 4.3 10*3/uL (ref 4.0–10.5)

## 2015-07-15 MED ORDER — ALBUTEROL SULFATE HFA 108 (90 BASE) MCG/ACT IN AERS
2.0000 | INHALATION_SPRAY | Freq: Once | RESPIRATORY_TRACT | Status: AC
  Administered 2015-07-15: 2 via RESPIRATORY_TRACT
  Filled 2015-07-15: qty 6.7

## 2015-07-15 MED ORDER — BENZONATATE 100 MG PO CAPS: 100.0000 mg | ORAL_CAPSULE | Freq: Three times a day (TID) | ORAL | Status: DC | PRN

## 2015-07-15 MED ORDER — ACETAMINOPHEN 500 MG PO TABS
1000.0000 mg | ORAL_TABLET | Freq: Once | ORAL | Status: AC
  Administered 2015-07-15: 1000 mg via ORAL
  Filled 2015-07-15: qty 2

## 2015-07-15 MED ORDER — ONDANSETRON 4 MG PO TBDP
8.0000 mg | ORAL_TABLET | Freq: Once | ORAL | Status: AC
  Administered 2015-07-15: 8 mg via ORAL
  Filled 2015-07-15: qty 2

## 2015-07-15 MED ORDER — KETOROLAC TROMETHAMINE 60 MG/2ML IM SOLN
60.0000 mg | Freq: Once | INTRAMUSCULAR | Status: AC
  Administered 2015-07-15: 60 mg via INTRAMUSCULAR
  Filled 2015-07-15: qty 2

## 2015-07-15 MED ORDER — GUAIFENESIN 100 MG/5ML PO LIQD: 100.0000 mg | ORAL | Status: DC | PRN

## 2015-07-15 MED ORDER — FLUTICASONE PROPIONATE 50 MCG/ACT NA SUSP: 2.0000 | Freq: Every day | NASAL | Status: DC

## 2015-07-15 MED ORDER — IBUPROFEN 800 MG PO TABS: 800.0000 mg | ORAL_TABLET | Freq: Three times a day (TID) | ORAL | Status: DC | PRN

## 2015-07-15 NOTE — ED Notes (Signed)
Pt reports to the ED for eval of productive yellow cough, nasal congestion/drianage, body aches, and intermittent fevers x several days has tried OTC Tylenol and Ibuprofen without relief. Pt denies any N/V but reports diarhhea x 2 days. Also states his low back is aching and his umbilical hernia is painful. Still able to pass stool. No discoloration noted to the hernia. Pt A&OX4, resp e/u, and skin warm and dry.

## 2015-07-15 NOTE — Discharge Instructions (Signed)
Read the information below.  Use the prescribed medication as directed.  Please discuss all new medications with your pharmacist.  You may return to the Emergency Department at any time for worsening condition or any new symptoms that concern you.  If you develop high fevers that do not resolve with tylenol or ibuprofen, you have difficulty swallowing or breathing, or you are unable to tolerate fluids by mouth, return to the ER for a recheck.   If you develop high fevers, worsening abdominal pain, uncontrolled vomiting, or are unable to tolerate fluids by mouth, return to the ER for a recheck.     Influenza, Adult Influenza ("the flu") is a viral infection of the respiratory tract. It occurs more often in winter months because people spend more time in close contact with one another. Influenza can make you feel very sick. Influenza easily spreads from person to person (contagious). CAUSES  Influenza is caused by a virus that infects the respiratory tract. You can catch the virus by breathing in droplets from an infected person's cough or sneeze. You can also catch the virus by touching something that was recently contaminated with the virus and then touching your mouth, nose, or eyes. RISKS AND COMPLICATIONS You may be at risk for a more severe case of influenza if you smoke cigarettes, have diabetes, have chronic heart disease (such as heart failure) or lung disease (such as asthma), or if you have a weakened immune system. Elderly people and pregnant women are also at risk for more serious infections. The most common problem of influenza is a lung infection (pneumonia). Sometimes, this problem can require emergency medical care and may be life threatening. SIGNS AND SYMPTOMS  Symptoms typically last 4 to 10 days and may include:  Fever.  Chills.  Headache, body aches, and muscle aches.  Sore throat.  Chest discomfort and cough.  Poor appetite.  Weakness or feeling  tired.  Dizziness.  Nausea or vomiting. DIAGNOSIS  Diagnosis of influenza is often made based on your history and a physical exam. A nose or throat swab test can be done to confirm the diagnosis. TREATMENT  In mild cases, influenza goes away on its own. Treatment is directed at relieving symptoms. For more severe cases, your health care provider may prescribe antiviral medicines to shorten the sickness. Antibiotic medicines are not effective because the infection is caused by a virus, not by bacteria. HOME CARE INSTRUCTIONS  Take medicines only as directed by your health care provider.  Use a cool mist humidifier to make breathing easier.  Get plenty of rest until your temperature returns to normal. This usually takes 3 to 4 days.  Drink enough fluid to keep your urine clear or pale yellow.  Cover yourmouth and nosewhen coughing or sneezing,and wash your handswellto prevent thevirusfrom spreading.  Stay homefromwork orschool untilthe fever is gonefor at least 39full day. PREVENTION  An annual influenza vaccination (flu shot) is the best way to avoid getting influenza. An annual flu shot is now routinely recommended for all adults in the [place] IF:  You experiencechest pain, yourcough worsens,or you producemore mucus.  Youhave nausea,vomiting, ordiarrhea.  Your fever returns or gets worse. SEEK IMMEDIATE MEDICAL CARE IF:  You havetrouble breathing, you become short of breath,or your skin ornails becomebluish.  You have severe painor stiffnessin the neck.  You develop a sudden headache, or pain in the face or ear.  You have nausea or vomiting that you cannot control. MAKE SURE YOU:  Understand these instructions.  Will watch your condition.  Will get help right away if you are not doing well or get worse.   This information is not intended to replace advice given to you by your health care provider. Make sure you discuss any  questions you have with your health care provider.   Document Released: 05/12/2000 Document Revised: 06/05/2014 Document Reviewed: 08/14/2011 Elsevier Interactive Patient Education [employer].

## 2015-07-15 NOTE — ED Notes (Signed)
Pt tolerating fluids and crackers.
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
## 2015-11-22 ENCOUNTER — Emergency Department (HOSPITAL_COMMUNITY): Payer: Medicaid Other

## 2015-11-22 ENCOUNTER — Emergency Department (HOSPITAL_COMMUNITY)
Admission: EM | Admit: 2015-11-22 | Discharge: 2015-11-23 | Disposition: A | Discharge status: Home / Self Care | Payer: Medicaid Other | Attending: Emergency Medicine | Admitting: Emergency Medicine

## 2015-11-22 ENCOUNTER — Encounter (HOSPITAL_COMMUNITY): Payer: Self-pay

## 2015-11-22 DIAGNOSIS — S40012A Contusion of left shoulder, initial encounter: Secondary | ICD-10-CM | POA: Insufficient documentation

## 2015-11-22 DIAGNOSIS — S0101XA Laceration without foreign body of scalp, initial encounter: Secondary | ICD-10-CM | POA: Insufficient documentation

## 2015-11-22 DIAGNOSIS — S01511A Laceration without foreign body of lip, initial encounter: Secondary | ICD-10-CM | POA: Insufficient documentation

## 2015-11-22 DIAGNOSIS — Y999 Unspecified external cause status: Secondary | ICD-10-CM | POA: Insufficient documentation

## 2015-11-22 DIAGNOSIS — Z96641 Presence of right artificial hip joint: Secondary | ICD-10-CM | POA: Diagnosis not present

## 2015-11-22 DIAGNOSIS — Y939 Activity, unspecified: Secondary | ICD-10-CM | POA: Diagnosis not present

## 2015-11-22 DIAGNOSIS — S2231XA Fracture of one rib, right side, initial encounter for closed fracture: Secondary | ICD-10-CM | POA: Insufficient documentation

## 2015-11-22 DIAGNOSIS — IMO0002 Reserved for concepts with insufficient information to code with codable children: Secondary | ICD-10-CM

## 2015-11-22 DIAGNOSIS — F17200 Nicotine dependence, unspecified, uncomplicated: Secondary | ICD-10-CM | POA: Diagnosis not present

## 2015-11-22 DIAGNOSIS — T07XXXA Unspecified multiple injuries, initial encounter: Secondary | ICD-10-CM

## 2015-11-22 DIAGNOSIS — Z79899 Other long term (current) drug therapy: Secondary | ICD-10-CM | POA: Diagnosis not present

## 2015-11-22 DIAGNOSIS — R51 Headache: Secondary | ICD-10-CM | POA: Diagnosis not present

## 2015-11-22 DIAGNOSIS — Y929 Unspecified place or not applicable: Secondary | ICD-10-CM | POA: Insufficient documentation

## 2015-11-22 LAB — CBC WITH DIFFERENTIAL/PLATELET
BASOS ABS: 0 10*3/uL (ref 0.0–0.1)
BASOS PCT: 0 %
EOS ABS: 0.1 10*3/uL (ref 0.0–0.7)
Eosinophils Relative: 1 %
HEMATOCRIT: 42.3 % (ref 39.0–52.0)
HEMOGLOBIN: 14.2 g/dL (ref 13.0–17.0)
Lymphocytes Relative: 36 %
Lymphs Abs: 2.2 10*3/uL (ref 0.7–4.0)
MCH: 30.1 pg (ref 26.0–34.0)
MCHC: 33.6 g/dL (ref 30.0–36.0)
MCV: 89.6 fL (ref 78.0–100.0)
Monocytes Absolute: 0.8 10*3/uL (ref 0.1–1.0)
Monocytes Relative: 13 %
NEUTROS ABS: 3 10*3/uL (ref 1.7–7.7)
NEUTROS PCT: 50 %
Platelets: 246 10*3/uL (ref 150–400)
RBC: 4.72 MIL/uL (ref 4.22–5.81)
RDW: 13.7 % (ref 11.5–15.5)
WBC: 6 10*3/uL (ref 4.0–10.5)

## 2015-11-22 LAB — URINALYSIS, ROUTINE W REFLEX MICROSCOPIC
Bilirubin Urine: NEGATIVE
GLUCOSE, UA: NEGATIVE mg/dL
KETONES UR: 15 mg/dL — AB
Nitrite: NEGATIVE
PH: 5.5 (ref 5.0–8.0)
Protein, ur: NEGATIVE mg/dL
Specific Gravity, Urine: 1.027 (ref 1.005–1.030)

## 2015-11-22 LAB — COMPREHENSIVE METABOLIC PANEL
ALBUMIN: 3.9 g/dL (ref 3.5–5.0)
ALK PHOS: 38 U/L (ref 38–126)
ALT: 31 U/L (ref 17–63)
ANION GAP: 6 (ref 5–15)
AST: 51 U/L — AB (ref 15–41)
BUN: 14 mg/dL (ref 6–20)
CALCIUM: 9.3 mg/dL (ref 8.9–10.3)
CO2: 27 mmol/L (ref 22–32)
CREATININE: 1.46 mg/dL — AB (ref 0.61–1.24)
Chloride: 105 mmol/L (ref 101–111)
GFR calc Af Amer: >60 mL/min (ref >60)
GFR calc non Af Amer: 55 mL/min — ABNORMAL LOW (ref >60)
GLUCOSE: 130 mg/dL — AB (ref 65–99)
Potassium: 3.5 mmol/L (ref 3.5–5.1)
SODIUM: 138 mmol/L (ref 135–145)
Total Bilirubin: 1.2 mg/dL (ref 0.3–1.2)
Total Protein: 7.2 g/dL (ref 6.5–8.1)

## 2015-11-22 LAB — URINE MICROSCOPIC-ADD ON

## 2015-11-22 MED ORDER — ONDANSETRON HCL 4 MG/2ML IJ SOLN
4.0000 mg | Freq: Once | INTRAMUSCULAR | Status: AC
  Administered 2015-11-22: 4 mg via INTRAVENOUS
  Filled 2015-11-22: qty 2

## 2015-11-22 MED ORDER — OXYCODONE-ACETAMINOPHEN 5-325 MG PO TABS: 1.0000 | ORAL_TABLET | Freq: Four times a day (QID) | ORAL | Status: DC | PRN

## 2015-11-22 MED ORDER — METHOCARBAMOL 500 MG PO TABS: 500.0000 mg | ORAL_TABLET | Freq: Two times a day (BID) | ORAL | Status: DC

## 2015-11-22 MED ORDER — IOPAMIDOL (ISOVUE-300) INJECTION 61%
100.0000 mL | Freq: Once | INTRAVENOUS | Status: AC | PRN
  Administered 2015-11-22: 100 mL via INTRAVENOUS

## 2015-11-22 MED ORDER — MORPHINE SULFATE (PF) 4 MG/ML IV SOLN
4.0000 mg | Freq: Once | INTRAVENOUS | Status: AC
  Administered 2015-11-22: 4 mg via INTRAVENOUS
  Filled 2015-11-22: qty 1

## 2015-11-22 MED ORDER — NAPROXEN 500 MG PO TABS: 500.0000 mg | ORAL_TABLET | Freq: Two times a day (BID) | ORAL | Status: DC

## 2015-11-22 MED ORDER — OXYCODONE-ACETAMINOPHEN 5-325 MG PO TABS
1.0000 | ORAL_TABLET | ORAL | Status: DC | PRN
  Administered 2015-11-22: 1 via ORAL

## 2015-11-22 MED ORDER — MORPHINE SULFATE (PF) 4 MG/ML IV SOLN: 4.0000 mg | Freq: Once | INTRAVENOUS | Status: DC

## 2015-11-22 MED ORDER — OXYCODONE-ACETAMINOPHEN 5-325 MG PO TABS
ORAL_TABLET | ORAL | Status: AC
  Filled 2015-11-22: qty 1

## 2015-11-22 MED ORDER — SODIUM CHLORIDE 0.9 % IV SOLN
INTRAVENOUS | Status: DC
  Administered 2015-11-22: 21:00:00 via INTRAVENOUS

## 2015-11-22 MED ORDER — KETOROLAC TROMETHAMINE 60 MG/2ML IM SOLN
60.0000 mg | Freq: Once | INTRAMUSCULAR | Status: AC
  Administered 2015-11-22: 60 mg via INTRAMUSCULAR
  Filled 2015-11-22: qty 2

## 2015-11-22 MED ORDER — LIDOCAINE HCL (PF) 1 % IJ SOLN: 5.0000 mL | Freq: Once | INTRAMUSCULAR | Status: DC

## 2015-11-22 NOTE — Discharge Instructions (Signed)
Rib Fracture °A rib fracture is a break or crack in one of the bones of the ribs. The ribs are a group of long, curved bones that wrap around your chest and attach to your spine. They protect your lungs and other organs in the chest cavity. A broken or cracked rib is often painful, but most do not cause other problems. Most rib fractures heal on their own over time. However, rib fractures can be more serious if multiple ribs are broken or if broken ribs move out of place and push against other structures. °CAUSES  °· A direct blow to the chest. For example, this could happen during contact sports, a car accident, or a fall against a hard object. °· Repetitive movements with high force, such as pitching a baseball or having severe coughing spells. °SYMPTOMS  °· Pain when you breathe in or cough. °· Pain when someone presses on the injured area. °DIAGNOSIS  °Your caregiver will perform a physical exam. Various imaging tests may be ordered to confirm the diagnosis and to look for related injuries. These tests may include a chest X-ray, computed tomography (CT), magnetic resonance imaging (MRI), or a bone scan. °TREATMENT  °Rib fractures usually heal on their own in 1-3 months. The longer healing period is often associated with a continued cough or other aggravating activities. During the healing period, pain control is very important. Medication is usually given to control pain. Hospitalization or surgery may be needed for more severe injuries, such as those in which multiple ribs are broken or the ribs have moved out of place.  °HOME CARE INSTRUCTIONS  °· Avoid strenuous activity and any activities or movements that cause pain. Be careful during activities and avoid bumping the injured rib. °· Gradually increase activity as directed by your caregiver. °· Only take over-the-counter or prescription medications as directed by your caregiver. Do not take other medications without asking your caregiver first. °· Apply ice  to the injured area for the first 1-2 days after you have been treated or as directed by your caregiver. Applying ice helps to reduce inflammation and pain. °· Put ice in a plastic bag. °· Place a towel between your skin and the bag.   °· Leave the ice on for 15-20 minutes at a time, every 2 hours while you are awake. °· Perform deep breathing as directed by your caregiver. This will help prevent pneumonia, which is a common complication of a broken rib. Your caregiver may instruct you to: °· Take deep breaths several times a day. °· Try to cough several times a day, holding a pillow against the injured area. °· Use a device called an incentive spirometer to practice deep breathing several times a day. °· Drink enough fluids to keep your urine clear or pale yellow. This will help you avoid constipation.   °· Do not wear a rib belt or binder. These restrict breathing, which can lead to pneumonia.   °SEEK IMMEDIATE MEDICAL CARE IF:  °· You have a fever.   °· You have difficulty breathing or shortness of breath.   °· You develop a continual cough, or you cough up thick or bloody sputum. °· You feel sick to your stomach (nausea), throw up (vomit), or have abdominal pain.   °· You have worsening pain not controlled with medications.   °MAKE SURE YOU: °· Understand these instructions. °· Will watch your condition. °· Will get help right away if you are not doing well or get worse. °  °This information is not intended to   replace advice given to you by your health care provider. Make sure you discuss any questions you have with your health care provider.   Document Released: 05/15/2005 Document Revised: 01/15/2013 Document Reviewed: 07/17/2012 Elsevier Interactive Patient Education 2016 [place] Assault Assault includes any behavior or physical attack--whether it is on purpose or not--that results in injury to another person, damage to property, or both. This also includes assault that has not yet  happened, but is planned to happen. Threats of assault may be physical, verbal, or written. They may be said or sent by:  Mail.  E-mail.  Text.  Social media.  Fax. The threats may be direct, implied, or understood. WHAT ARE THE DIFFERENT FORMS OF ASSAULT? Forms of assault include:  Physically assaulting a person. This includes physical threats to inflict physical harm as well as:  Slapping.  Hitting.  Poking.  Kicking.  Punching.  Pushing.  Sexually assaulting a person. Sexual assault is any sexual activity that a person is forced, threatened, or coerced to participate in. It may or may not involve physical contact with the person who is assaulting you. You are sexually assaulted if you are forced to have sexual contact of any kind.  Damaging or destroying a person's assistive equipment, such as glasses, canes, or walkers.  Throwing or hitting objects.  Using or displaying a weapon to harm or threaten someone.  Using or displaying an object that appears to be a weapon in a threatening manner.  Using greater physical size or strength to intimidate someone.  Making intimidating or threatening gestures.  Bullying.  Hazing.  Using language that is intimidating, threatening, hostile, or abusive.  Stalking.  Restraining someone with force. WHAT SHOULD I DO IF I EXPERIENCE ASSAULT?  Report assaults, threats, and stalking to the police. Call your local emergency services (911 in the [place] if you are in immediate danger or you need medical help.  You can work with a Chief Executive Officer or an advocate to get legal protection against someone who has assaulted you or threatened you with assault. Protection includes restraining orders and private addresses. Crimes against you, such as assault, can also be prosecuted through the courts. Laws will vary depending on where you live.   This information is not intended to replace advice given to you by your health care provider. Make sure  you discuss any questions you have with your health care provider.   Document Released: 05/15/2005 Document Revised: 06/05/2014 Document Reviewed: 01/30/2014 Elsevier Interactive Patient Education [employer].
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
## 2015-11-24 LAB — URINE CULTURE
CULTURE: NO GROWTH
SPECIAL REQUESTS: NORMAL

## 2016-09-13 ENCOUNTER — Encounter (HOSPITAL_COMMUNITY): Payer: Self-pay | Admitting: *Deleted

## 2016-09-13 ENCOUNTER — Emergency Department (HOSPITAL_COMMUNITY)
Admission: EM | Admit: 2016-09-13 | Discharge: 2016-09-13 | Disposition: A | Discharge status: Home / Self Care | Payer: Medicaid Other | Attending: Emergency Medicine | Admitting: Emergency Medicine

## 2016-09-13 DIAGNOSIS — Z96641 Presence of right artificial hip joint: Secondary | ICD-10-CM | POA: Diagnosis not present

## 2016-09-13 DIAGNOSIS — Z79899 Other long term (current) drug therapy: Secondary | ICD-10-CM | POA: Insufficient documentation

## 2016-09-13 DIAGNOSIS — M545 Low back pain, unspecified: Secondary | ICD-10-CM

## 2016-09-13 DIAGNOSIS — Y999 Unspecified external cause status: Secondary | ICD-10-CM | POA: Insufficient documentation

## 2016-09-13 DIAGNOSIS — Y929 Unspecified place or not applicable: Secondary | ICD-10-CM | POA: Insufficient documentation

## 2016-09-13 DIAGNOSIS — Y939 Activity, unspecified: Secondary | ICD-10-CM | POA: Diagnosis not present

## 2016-09-13 DIAGNOSIS — X500XXA Overexertion from strenuous movement or load, initial encounter: Secondary | ICD-10-CM | POA: Insufficient documentation

## 2016-09-13 DIAGNOSIS — Z87891 Personal history of nicotine dependence: Secondary | ICD-10-CM | POA: Insufficient documentation

## 2016-09-13 MED ORDER — KETOROLAC TROMETHAMINE 30 MG/ML IJ SOLN
30.0000 mg | Freq: Once | INTRAMUSCULAR | Status: AC
  Administered 2016-09-13: 30 mg via INTRAMUSCULAR
  Filled 2016-09-13: qty 1

## 2016-09-13 MED ORDER — MELOXICAM 7.5 MG PO TABS: 15.0000 mg | ORAL_TABLET | Freq: Every day | ORAL | 0 refills | Status: DC

## 2016-09-13 NOTE — Discharge Instructions (Signed)
Please take your medications as prescribed. You may take this medication with Tylenol to help with your discomfort. Do not take this medication in combination with ibuprofen, Motrin, Advil or other NSAIDs as they were similarly. Please follow-up with your doctor for further evaluation and management of your symptoms. Return to ED for new or worsening symptoms as we discussed.
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
## 2016-12-14 ENCOUNTER — Emergency Department (HOSPITAL_COMMUNITY): Payer: Medicaid Other

## 2016-12-14 ENCOUNTER — Emergency Department (HOSPITAL_COMMUNITY)
Admission: EM | Admit: 2016-12-14 | Discharge: 2016-12-14 | Disposition: A | Discharge status: Home / Self Care | Payer: Medicaid Other | Attending: Emergency Medicine | Admitting: Emergency Medicine

## 2016-12-14 ENCOUNTER — Encounter (HOSPITAL_COMMUNITY): Payer: Self-pay | Admitting: Nurse Practitioner

## 2016-12-14 DIAGNOSIS — Y999 Unspecified external cause status: Secondary | ICD-10-CM | POA: Diagnosis not present

## 2016-12-14 DIAGNOSIS — F17200 Nicotine dependence, unspecified, uncomplicated: Secondary | ICD-10-CM | POA: Insufficient documentation

## 2016-12-14 DIAGNOSIS — Y939 Activity, unspecified: Secondary | ICD-10-CM | POA: Insufficient documentation

## 2016-12-14 DIAGNOSIS — T148XXA Other injury of unspecified body region, initial encounter: Secondary | ICD-10-CM | POA: Insufficient documentation

## 2016-12-14 DIAGNOSIS — M546 Pain in thoracic spine: Secondary | ICD-10-CM | POA: Insufficient documentation

## 2016-12-14 DIAGNOSIS — Y929 Unspecified place or not applicable: Secondary | ICD-10-CM | POA: Diagnosis not present

## 2016-12-14 DIAGNOSIS — R079 Chest pain, unspecified: Secondary | ICD-10-CM | POA: Diagnosis present

## 2016-12-14 DIAGNOSIS — T07XXXA Unspecified multiple injuries, initial encounter: Secondary | ICD-10-CM

## 2016-12-14 DIAGNOSIS — W503XXA Accidental bite by another person, initial encounter: Secondary | ICD-10-CM

## 2016-12-14 MED ORDER — AMOXICILLIN-POT CLAVULANATE 875-125 MG PO TABS: 1.0000 | ORAL_TABLET | Freq: Two times a day (BID) | ORAL | 0 refills | Status: DC

## 2016-12-14 MED ORDER — HYDROCODONE-ACETAMINOPHEN 5-325 MG PO TABS
1.0000 | ORAL_TABLET | Freq: Once | ORAL | Status: AC
  Administered 2016-12-14: 1 via ORAL
  Filled 2016-12-14: qty 1

## 2016-12-14 MED ORDER — AMOXICILLIN-POT CLAVULANATE 875-125 MG PO TABS
1.0000 | ORAL_TABLET | Freq: Once | ORAL | Status: AC
  Administered 2016-12-14: 1 via ORAL
  Filled 2016-12-14: qty 1

## 2016-12-14 MED ORDER — DICLOFENAC SODIUM 50 MG PO TBEC: 50.0000 mg | DELAYED_RELEASE_TABLET | Freq: Two times a day (BID) | ORAL | 0 refills | Status: DC

## 2016-12-14 MED ORDER — CYCLOBENZAPRINE HCL 10 MG PO TABS: 10.0000 mg | ORAL_TABLET | Freq: Two times a day (BID) | ORAL | 0 refills | Status: DC | PRN
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
## 2016-12-14 NOTE — ED Notes (Signed)
Pt verbalized understanding discharge instructions and denies any further needs or questions at this time. VS stable, ambulatory and steady gait.   
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
## 2016-12-26 ENCOUNTER — Encounter (HOSPITAL_COMMUNITY): Payer: Self-pay

## 2016-12-26 ENCOUNTER — Emergency Department (HOSPITAL_COMMUNITY)
Admission: EM | Admit: 2016-12-26 | Discharge: 2016-12-26 | Disposition: A | Discharge status: Home / Self Care | Payer: Medicaid Other | Attending: Emergency Medicine | Admitting: Emergency Medicine

## 2016-12-26 DIAGNOSIS — Z79899 Other long term (current) drug therapy: Secondary | ICD-10-CM | POA: Diagnosis not present

## 2016-12-26 DIAGNOSIS — F17210 Nicotine dependence, cigarettes, uncomplicated: Secondary | ICD-10-CM | POA: Diagnosis not present

## 2016-12-26 DIAGNOSIS — R0789 Other chest pain: Secondary | ICD-10-CM | POA: Diagnosis not present

## 2016-12-26 MED ORDER — TRAMADOL HCL 50 MG PO TABS: 50.0000 mg | ORAL_TABLET | Freq: Four times a day (QID) | ORAL | 0 refills | Status: DC | PRN

## 2016-12-26 MED ORDER — HYDROCODONE-ACETAMINOPHEN 5-325 MG PO TABS
1.0000 | ORAL_TABLET | Freq: Once | ORAL | Status: AC
  Administered 2016-12-26: 1 via ORAL
  Filled 2016-12-26: qty 1
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
## 2016-12-26 NOTE — Discharge Instructions (Signed)
It was my pleasure taking care of you today!   Tramadol only as needed for severe pain. Continue diclofenac twice daily.  If symptoms do not improve in the next week or two, please follow up with your primary care provider.   Return to ER for new or worsening symptoms, any additional concerns.

## 2017-03-12 ENCOUNTER — Emergency Department (HOSPITAL_COMMUNITY): Payer: Medicaid Other

## 2017-03-12 ENCOUNTER — Encounter (HOSPITAL_COMMUNITY): Payer: Self-pay | Admitting: *Deleted

## 2017-03-12 DIAGNOSIS — F17200 Nicotine dependence, unspecified, uncomplicated: Secondary | ICD-10-CM | POA: Insufficient documentation

## 2017-03-12 DIAGNOSIS — R0789 Other chest pain: Secondary | ICD-10-CM | POA: Insufficient documentation

## 2017-03-12 DIAGNOSIS — R1084 Generalized abdominal pain: Secondary | ICD-10-CM | POA: Diagnosis not present

## 2017-03-12 DIAGNOSIS — R079 Chest pain, unspecified: Secondary | ICD-10-CM | POA: Diagnosis present

## 2017-03-12 LAB — CBC
HCT: 41.4 % (ref 39.0–52.0)
HEMOGLOBIN: 13.8 g/dL (ref 13.0–17.0)
MCH: 29.3 pg (ref 26.0–34.0)
MCHC: 33.3 g/dL (ref 30.0–36.0)
MCV: 87.9 fL (ref 78.0–100.0)
Platelets: 284 10*3/uL (ref 150–400)
RBC: 4.71 MIL/uL (ref 4.22–5.81)
RDW: 13.7 % (ref 11.5–15.5)
WBC: 4.9 10*3/uL (ref 4.0–10.5)

## 2017-03-12 LAB — BASIC METABOLIC PANEL
Anion gap: 7 (ref 5–15)
BUN: 12 mg/dL (ref 6–20)
CHLORIDE: 110 mmol/L (ref 101–111)
CO2: 22 mmol/L (ref 22–32)
CREATININE: 1.28 mg/dL — AB (ref 0.61–1.24)
Calcium: 8.9 mg/dL (ref 8.9–10.3)
GFR calc Af Amer: >60 mL/min (ref >60)
GFR calc non Af Amer: >60 mL/min (ref >60)
Glucose, Bld: 99 mg/dL (ref 65–99)
POTASSIUM: 4 mmol/L (ref 3.5–5.1)
Sodium: 139 mmol/L (ref 135–145)

## 2017-03-12 LAB — I-STAT TROPONIN, ED: TROPONIN I, POC: 0 ng/mL (ref 0.00–0.08)

## 2017-03-12 NOTE — ED Triage Notes (Signed)
Pt in c/o L sided CP onset x 3 days, pt reports SOB, denies n/v/d, A&O x4

## 2017-03-13 ENCOUNTER — Emergency Department (HOSPITAL_COMMUNITY)
Admission: EM | Admit: 2017-03-13 | Discharge: 2017-03-13 | Disposition: A | Discharge status: Home / Self Care | Payer: Medicaid Other | Attending: Emergency Medicine | Admitting: Emergency Medicine

## 2017-03-13 DIAGNOSIS — R1084 Generalized abdominal pain: Secondary | ICD-10-CM

## 2017-03-13 DIAGNOSIS — R0789 Other chest pain: Secondary | ICD-10-CM

## 2017-03-13 LAB — TROPONIN I: Troponin I: <0.03 ng/mL (ref <0.03)

## 2017-03-13 MED ORDER — ONDANSETRON HCL 4 MG/2ML IJ SOLN
4.0000 mg | Freq: Once | INTRAMUSCULAR | Status: AC
  Administered 2017-03-13: 4 mg via INTRAVENOUS
  Filled 2017-03-13: qty 2

## 2017-03-13 MED ORDER — HYDROCODONE-ACETAMINOPHEN 5-325 MG PO TABS: 1.0000 | ORAL_TABLET | ORAL | 0 refills | Status: DC | PRN

## 2017-03-13 MED ORDER — HYDROMORPHONE HCL 1 MG/ML IJ SOLN
1.0000 mg | Freq: Once | INTRAMUSCULAR | Status: AC
  Administered 2017-03-13: 1 mg via INTRAVENOUS
  Filled 2017-03-13: qty 1
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
## 2017-07-17 ENCOUNTER — Encounter (HOSPITAL_COMMUNITY): Payer: Self-pay | Admitting: Family Medicine

## 2017-07-17 ENCOUNTER — Emergency Department (HOSPITAL_COMMUNITY)
Admission: EM | Admit: 2017-07-17 | Discharge: 2017-07-17 | Disposition: A | Discharge status: Home / Self Care | Payer: Medicaid Other | Attending: Emergency Medicine | Admitting: Emergency Medicine

## 2017-07-17 ENCOUNTER — Other Ambulatory Visit: Payer: Self-pay

## 2017-07-17 ENCOUNTER — Encounter (HOSPITAL_COMMUNITY): Payer: Self-pay

## 2017-07-17 ENCOUNTER — Ambulatory Visit (HOSPITAL_COMMUNITY)
Admission: EM | Admit: 2017-07-17 | Discharge: 2017-07-17 | Disposition: A | Discharge status: Home / Self Care | Payer: Medicaid Other | Attending: Internal Medicine | Admitting: Internal Medicine

## 2017-07-17 DIAGNOSIS — Y9389 Activity, other specified: Secondary | ICD-10-CM | POA: Diagnosis not present

## 2017-07-17 DIAGNOSIS — S6992XA Unspecified injury of left wrist, hand and finger(s), initial encounter: Secondary | ICD-10-CM | POA: Diagnosis present

## 2017-07-17 DIAGNOSIS — Y92410 Unspecified street and highway as the place of occurrence of the external cause: Secondary | ICD-10-CM | POA: Diagnosis not present

## 2017-07-17 DIAGNOSIS — Y999 Unspecified external cause status: Secondary | ICD-10-CM | POA: Diagnosis not present

## 2017-07-17 DIAGNOSIS — Z23 Encounter for immunization: Secondary | ICD-10-CM | POA: Insufficient documentation

## 2017-07-17 DIAGNOSIS — W540XXA Bitten by dog, initial encounter: Secondary | ICD-10-CM | POA: Insufficient documentation

## 2017-07-17 DIAGNOSIS — S61452A Open bite of left hand, initial encounter: Secondary | ICD-10-CM

## 2017-07-17 DIAGNOSIS — Z2914 Encounter for prophylactic rabies immune globin: Secondary | ICD-10-CM | POA: Insufficient documentation

## 2017-07-17 MED ORDER — RABIES VACCINE, PCEC IM SUSR
1.0000 mL | Freq: Once | INTRAMUSCULAR | Status: AC
  Administered 2017-07-17: 1 mL via INTRAMUSCULAR
  Filled 2017-07-17: qty 1

## 2017-07-17 MED ORDER — RABIES IMMUNE GLOBULIN 150 UNIT/ML IM INJ
20.0000 [IU]/kg | INJECTION | Freq: Once | INTRAMUSCULAR | Status: AC
  Administered 2017-07-17: 1350 [IU] via INTRAMUSCULAR
  Filled 2017-07-17: qty 9

## 2017-07-17 MED ORDER — MUPIROCIN 2 % EX OINT: 1.0000 "application " | TOPICAL_OINTMENT | Freq: Two times a day (BID) | CUTANEOUS | 0 refills | Status: AC

## 2017-07-17 NOTE — Discharge Instructions (Signed)
You received the first round of rabies vaccine. You need 3 more, a total of 4.  Return on day 3 (07/20/17), day 7 (07/24/17) and day 14 (07/31/2017) for shots #2, #3, #4.   Keep bite wound clean. Monitor for signs of infection including swelling, redness, warmth, pus, fever, pain with touch or movement.
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
## 2017-07-17 NOTE — Discharge Instructions (Addendum)
Hand wound checked today.  Healing well so far.  Wash gently with soap/water 1-2 times daily, apply antibiotic ointment and bandage.  Recheck if any increasing redness/swelling/pain/drainage or new fever>100.5.  Anticipate gradual healing over the next 2-3 weeks.
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
## 2017-07-20 ENCOUNTER — Encounter (HOSPITAL_COMMUNITY): Payer: Self-pay

## 2017-07-20 ENCOUNTER — Emergency Department (HOSPITAL_COMMUNITY)
Admission: EM | Admit: 2017-07-20 | Discharge: 2017-07-20 | Disposition: A | Discharge status: Home / Self Care | Payer: Medicaid Other | Attending: Emergency Medicine | Admitting: Emergency Medicine

## 2017-07-20 ENCOUNTER — Other Ambulatory Visit: Payer: Self-pay

## 2017-07-20 DIAGNOSIS — F17210 Nicotine dependence, cigarettes, uncomplicated: Secondary | ICD-10-CM | POA: Diagnosis not present

## 2017-07-20 DIAGNOSIS — Z203 Contact with and (suspected) exposure to rabies: Secondary | ICD-10-CM | POA: Insufficient documentation

## 2017-07-20 DIAGNOSIS — Z23 Encounter for immunization: Secondary | ICD-10-CM | POA: Diagnosis not present

## 2017-07-20 MED ORDER — ACETAMINOPHEN 325 MG PO TABS
650.0000 mg | ORAL_TABLET | Freq: Once | ORAL | Status: AC
  Administered 2017-07-20: 650 mg via ORAL
  Filled 2017-07-20: qty 2

## 2017-07-20 MED ORDER — RABIES VACCINE, PCEC IM SUSR
1.0000 mL | Freq: Once | INTRAMUSCULAR | Status: AC
  Administered 2017-07-20: 1 mL via INTRAMUSCULAR
  Filled 2017-07-20: qty 1

## 2017-07-20 NOTE — Discharge Instructions (Addendum)
You  got your second rabies vaccine today in the ER.  Please return Tuesday 2/26 for your third vaccine.  You also need a fourth vaccine Tuesday 3/5.   You can go to the urgent care to get this vaccine.  Return to the emergency department if you notice pus coming out of the hand, have fever greater than 100.4 f, worsening redness or pain over the bite.
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
## 2017-07-20 NOTE — ED Triage Notes (Signed)
Pt here for follow-up rabies shots

## 2017-08-02 ENCOUNTER — Ambulatory Visit (HOSPITAL_COMMUNITY)
Admission: EM | Admit: 2017-08-02 | Discharge: 2017-08-02 | Disposition: A | Discharge status: Home / Self Care | Payer: Medicaid Other | Attending: Internal Medicine | Admitting: Internal Medicine

## 2017-08-02 DIAGNOSIS — Z203 Contact with and (suspected) exposure to rabies: Secondary | ICD-10-CM

## 2017-08-02 DIAGNOSIS — Z23 Encounter for immunization: Secondary | ICD-10-CM

## 2017-08-02 MED ORDER — RABIES VACCINE, PCEC IM SUSR
INTRAMUSCULAR | Status: AC
  Filled 2017-08-02: qty 1

## 2017-08-02 MED ORDER — RABIES VACCINE, PCEC IM SUSR
1.0000 mL | Freq: Once | INTRAMUSCULAR | Status: AC
  Administered 2017-08-02: 1 mL via INTRAMUSCULAR

## 2017-08-02 NOTE — ED Notes (Addendum)
Spoke with Dr. Mannie Stabile about pt, pt stated he missed his 2nd and 3rd rabies shot because "I just felt sick and I couldn't make it". Pt supposed to have 2nd shot on the 26th and final shot on the 5th of march, pt here for second shot today. Per Dr. Mannie Stabile, to give patient his 2nd shot today and return again 1 week from today on march 14th to get final shot. Pt agreeable to plan, no further questions.

## 2018-11-11 ENCOUNTER — Emergency Department (HOSPITAL_COMMUNITY): Payer: Medicaid Other

## 2018-11-11 ENCOUNTER — Encounter (HOSPITAL_COMMUNITY): Payer: Self-pay | Admitting: Emergency Medicine

## 2018-11-11 ENCOUNTER — Emergency Department (HOSPITAL_COMMUNITY)
Admission: EM | Admit: 2018-11-11 | Discharge: 2018-11-11 | Disposition: A | Discharge status: Home / Self Care | Payer: Medicaid Other | Attending: Emergency Medicine | Admitting: Emergency Medicine

## 2018-11-11 ENCOUNTER — Other Ambulatory Visit: Payer: Self-pay

## 2018-11-11 DIAGNOSIS — Z87820 Personal history of traumatic brain injury: Secondary | ICD-10-CM | POA: Diagnosis not present

## 2018-11-11 DIAGNOSIS — K861 Other chronic pancreatitis: Secondary | ICD-10-CM | POA: Insufficient documentation

## 2018-11-11 DIAGNOSIS — R1013 Epigastric pain: Secondary | ICD-10-CM | POA: Insufficient documentation

## 2018-11-11 DIAGNOSIS — R066 Hiccough: Secondary | ICD-10-CM | POA: Insufficient documentation

## 2018-11-11 DIAGNOSIS — Z720 Tobacco use: Secondary | ICD-10-CM | POA: Insufficient documentation

## 2018-11-11 LAB — CBC WITH DIFFERENTIAL/PLATELET
Abs Immature Granulocytes: 0.01 10*3/uL (ref 0.00–0.07)
Basophils Absolute: 0 10*3/uL (ref 0.0–0.1)
Basophils Relative: 1 %
Eosinophils Absolute: 0.2 10*3/uL (ref 0.0–0.5)
Eosinophils Relative: 3 %
HCT: 44.5 % (ref 39.0–52.0)
Hemoglobin: 14.5 g/dL (ref 13.0–17.0)
Immature Granulocytes: 0 %
Lymphocytes Relative: 40 %
Lymphs Abs: 2.2 10*3/uL (ref 0.7–4.0)
MCH: 30 pg (ref 26.0–34.0)
MCHC: 32.6 g/dL (ref 30.0–36.0)
MCV: 91.9 fL (ref 80.0–100.0)
Monocytes Absolute: 0.7 10*3/uL (ref 0.1–1.0)
Monocytes Relative: 13 %
Neutro Abs: 2.4 10*3/uL (ref 1.7–7.7)
Neutrophils Relative %: 43 %
Platelets: 281 10*3/uL (ref 150–400)
RBC: 4.84 MIL/uL (ref 4.22–5.81)
RDW: 13.3 % (ref 11.5–15.5)
WBC: 5.5 10*3/uL (ref 4.0–10.5)
nRBC: 0 % (ref 0.0–0.2)

## 2018-11-11 LAB — TROPONIN I
Troponin I: <0.03 ng/mL (ref <0.03)
Troponin I: <0.03 ng/mL (ref <0.03)

## 2018-11-11 LAB — COMPREHENSIVE METABOLIC PANEL
ALT: 24 U/L (ref 0–44)
AST: 28 U/L (ref 15–41)
Albumin: 3.6 g/dL (ref 3.5–5.0)
Alkaline Phosphatase: 40 U/L (ref 38–126)
Anion gap: 11 (ref 5–15)
BUN: 16 mg/dL (ref 6–20)
CO2: 23 mmol/L (ref 22–32)
Calcium: 8.6 mg/dL — ABNORMAL LOW (ref 8.9–10.3)
Chloride: 104 mmol/L (ref 98–111)
Creatinine, Ser: 1.4 mg/dL — ABNORMAL HIGH (ref 0.61–1.24)
GFR calc Af Amer: >60 mL/min (ref >60)
GFR calc non Af Amer: 58 mL/min — ABNORMAL LOW (ref >60)
Glucose, Bld: 91 mg/dL (ref 70–99)
Potassium: 3.8 mmol/L (ref 3.5–5.1)
Sodium: 138 mmol/L (ref 135–145)
Total Bilirubin: 1.1 mg/dL (ref 0.3–1.2)
Total Protein: 6.7 g/dL (ref 6.5–8.1)

## 2018-11-11 LAB — LIPASE, BLOOD: Lipase: 28 U/L (ref 11–51)

## 2018-11-11 MED ORDER — OMEPRAZOLE 20 MG PO CPDR: 20.0000 mg | DELAYED_RELEASE_CAPSULE | Freq: Every day | ORAL | 0 refills | Status: DC

## 2018-11-11 MED ORDER — DIAZEPAM 5 MG PO TABS
5.0000 mg | ORAL_TABLET | Freq: Once | ORAL | Status: AC
  Administered 2018-11-11: 5 mg via ORAL
  Filled 2018-11-11: qty 1

## 2018-11-11 MED ORDER — PANTOPRAZOLE SODIUM 40 MG IV SOLR
40.0000 mg | Freq: Once | INTRAVENOUS | Status: AC
  Administered 2018-11-11: 40 mg via INTRAVENOUS
  Filled 2018-11-11: qty 40

## 2018-11-11 MED ORDER — SODIUM CHLORIDE 0.9 % IV BOLUS
500.0000 mL | Freq: Once | INTRAVENOUS | Status: AC
  Administered 2018-11-11: 500 mL via INTRAVENOUS

## 2018-11-11 MED ORDER — METOCLOPRAMIDE HCL 5 MG/ML IJ SOLN
10.0000 mg | Freq: Once | INTRAMUSCULAR | Status: AC
  Administered 2018-11-11: 10 mg via INTRAVENOUS
  Filled 2018-11-11: qty 2

## 2018-11-11 MED ORDER — CHLORPROMAZINE HCL 25 MG PO TABS
25.0000 mg | ORAL_TABLET | Freq: Once | ORAL | Status: AC
  Administered 2018-11-11: 25 mg via ORAL
  Filled 2018-11-11: qty 1

## 2018-11-11 MED ORDER — IOHEXOL 350 MG/ML SOLN
100.0000 mL | Freq: Once | INTRAVENOUS | Status: AC | PRN
  Administered 2018-11-11: 100 mL via INTRAVENOUS

## 2018-11-11 MED ORDER — ALUM & MAG HYDROXIDE-SIMETH 200-200-20 MG/5ML PO SUSP
30.0000 mL | Freq: Once | ORAL | Status: AC
  Administered 2018-11-11: 30 mL via ORAL
  Filled 2018-11-11: qty 30

## 2018-11-11 MED ORDER — CHLORPROMAZINE HCL 25 MG PO TABS: 25.0000 mg | ORAL_TABLET | Freq: Three times a day (TID) | ORAL | 0 refills | Status: DC | PRN

## 2018-11-11 MED ORDER — FAMOTIDINE IN NACL 20-0.9 MG/50ML-% IV SOLN
20.0000 mg | Freq: Once | INTRAVENOUS | Status: AC
  Administered 2018-11-11: 20 mg via INTRAVENOUS
  Filled 2018-11-11: qty 50

## 2018-11-11 MED ORDER — LIDOCAINE VISCOUS HCL 2 % MT SOLN
15.0000 mL | Freq: Once | OROMUCOSAL | Status: AC
  Administered 2018-11-11: 15 mL via ORAL
  Filled 2018-11-11: qty 15

## 2018-11-11 MED ORDER — DIPHENHYDRAMINE HCL 50 MG/ML IJ SOLN
12.5000 mg | Freq: Once | INTRAMUSCULAR | Status: AC
  Administered 2018-11-11: 12.5 mg via INTRAVENOUS
  Filled 2018-11-11: qty 1

## 2018-11-11 NOTE — ED Triage Notes (Signed)
Pt reports hiccups for the last 2 days. Denies n/v/d/fever. Reports history of stomach ulcers. VSS.

## 2018-11-11 NOTE — ED Notes (Signed)
Pt still having hiccups
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
## 2018-11-11 NOTE — Discharge Instructions (Signed)
You were seen in the emergency department today for hiccups.  Your work-up was overall reassuring.  Your CT did not show acute abnormalities.  Your labs and EKG did not show a heart attack.  Overall it is unclear why you are having the hiccups persistently.  We would like you to try Prilosec daily to help with gastric irritation as well as Thorazine 1 to 2 tablets every 8 hours as needed for hiccups.  We have prescribed you new medication(s) today. Discuss the medications prescribed today with your pharmacist as they can have adverse effects and interactions with your other medicines including over the counter and prescribed medications. Seek medical evaluation if you start to experience new or abnormal symptoms after taking one of these medicines, seek care immediately if you start to experience difficulty breathing, feeling of your throat closing, facial swelling, or rash as these could be indications of a more serious allergic reaction   We would like you to follow-up with a gastroenterologist, specialist, for further evaluation and management.  Please call [facility] this afternoon to make an appointment for tomorrow or the next day.  Return to the ER for new or worsening symptoms or any other concerns.

## 2019-08-03 ENCOUNTER — Other Ambulatory Visit: Payer: Self-pay

## 2019-08-03 ENCOUNTER — Emergency Department (HOSPITAL_COMMUNITY): Payer: Medicaid Other

## 2019-08-03 ENCOUNTER — Emergency Department (HOSPITAL_COMMUNITY)
Admission: EM | Admit: 2019-08-03 | Discharge: 2019-08-04 | Disposition: A | Discharge status: Home / Self Care | Payer: Medicaid Other | Attending: Emergency Medicine | Admitting: Emergency Medicine

## 2019-08-03 ENCOUNTER — Encounter (HOSPITAL_COMMUNITY): Payer: Self-pay

## 2019-08-03 DIAGNOSIS — R109 Unspecified abdominal pain: Secondary | ICD-10-CM

## 2019-08-03 DIAGNOSIS — F17210 Nicotine dependence, cigarettes, uncomplicated: Secondary | ICD-10-CM | POA: Diagnosis not present

## 2019-08-03 DIAGNOSIS — R066 Hiccough: Secondary | ICD-10-CM | POA: Diagnosis not present

## 2019-08-03 DIAGNOSIS — K439 Ventral hernia without obstruction or gangrene: Secondary | ICD-10-CM | POA: Diagnosis not present

## 2019-08-03 DIAGNOSIS — N289 Disorder of kidney and ureter, unspecified: Secondary | ICD-10-CM | POA: Insufficient documentation

## 2019-08-03 LAB — COMPREHENSIVE METABOLIC PANEL
ALT: 26 U/L (ref 0–44)
AST: 32 U/L (ref 15–41)
Albumin: 3.8 g/dL (ref 3.5–5.0)
Alkaline Phosphatase: 38 U/L (ref 38–126)
Anion gap: 10 (ref 5–15)
BUN: 11 mg/dL (ref 6–20)
CO2: 24 mmol/L (ref 22–32)
Calcium: 9.2 mg/dL (ref 8.9–10.3)
Chloride: 102 mmol/L (ref 98–111)
Creatinine, Ser: 1.32 mg/dL — ABNORMAL HIGH (ref 0.61–1.24)
GFR calc Af Amer: >60 mL/min (ref >60)
GFR calc non Af Amer: >60 mL/min (ref >60)
Glucose, Bld: 91 mg/dL (ref 70–99)
Potassium: 4.2 mmol/L (ref 3.5–5.1)
Sodium: 136 mmol/L (ref 135–145)
Total Bilirubin: 0.9 mg/dL (ref 0.3–1.2)
Total Protein: 6.8 g/dL (ref 6.5–8.1)

## 2019-08-03 LAB — CBC
HCT: 44.7 % (ref 39.0–52.0)
Hemoglobin: 15.3 g/dL (ref 13.0–17.0)
MCH: 30.6 pg (ref 26.0–34.0)
MCHC: 34.2 g/dL (ref 30.0–36.0)
MCV: 89.4 fL (ref 80.0–100.0)
Platelets: 349 10*3/uL (ref 150–400)
RBC: 5 MIL/uL (ref 4.22–5.81)
RDW: 12.6 % (ref 11.5–15.5)
WBC: 7.6 10*3/uL (ref 4.0–10.5)
nRBC: 0 % (ref 0.0–0.2)

## 2019-08-03 LAB — URINALYSIS, ROUTINE W REFLEX MICROSCOPIC
Bilirubin Urine: NEGATIVE
Glucose, UA: NEGATIVE mg/dL
Ketones, ur: NEGATIVE mg/dL
Nitrite: NEGATIVE
Protein, ur: NEGATIVE mg/dL
Specific Gravity, Urine: 1.005 (ref 1.005–1.030)
pH: 6 (ref 5.0–8.0)

## 2019-08-03 LAB — LIPASE, BLOOD: Lipase: 48 U/L (ref 11–51)

## 2019-08-03 MED ORDER — SODIUM CHLORIDE 0.9 % IV BOLUS
1000.0000 mL | Freq: Once | INTRAVENOUS | Status: AC
  Administered 2019-08-03: 1000 mL via INTRAVENOUS

## 2019-08-03 MED ORDER — IOHEXOL 300 MG/ML  SOLN
100.0000 mL | Freq: Once | INTRAMUSCULAR | Status: AC | PRN
  Administered 2019-08-03: 100 mL via INTRAVENOUS

## 2019-08-03 MED ORDER — SODIUM CHLORIDE 0.9% FLUSH
3.0000 mL | Freq: Once | INTRAVENOUS | Status: AC
  Administered 2019-08-03: 3 mL via INTRAVENOUS

## 2019-08-03 MED ORDER — METOCLOPRAMIDE HCL 5 MG/ML IJ SOLN
10.0000 mg | Freq: Once | INTRAMUSCULAR | Status: AC
  Administered 2019-08-03: 10 mg via INTRAVENOUS
  Filled 2019-08-03: qty 2

## 2019-08-03 MED ORDER — FENTANYL CITRATE (PF) 100 MCG/2ML IJ SOLN
50.0000 ug | INTRAMUSCULAR | Status: DC | PRN
  Administered 2019-08-03 (×2): 50 ug via INTRAVENOUS
  Filled 2019-08-03 (×3): qty 2
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
## 2019-08-03 NOTE — ED Notes (Signed)
Patient transported to CT 

## 2019-08-03 NOTE — ED Triage Notes (Addendum)
Pt reports abd pain from a "cyst" with hiccups x3 days. Reports he has eaten tomato sauce 3 days ago. 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

## 2019-08-04 MED ORDER — CHLORPROMAZINE HCL 25 MG PO TABS: 25.0000 mg | ORAL_TABLET | Freq: Three times a day (TID) | ORAL | 0 refills | Status: DC | PRN

## 2019-08-04 MED ORDER — CHLORPROMAZINE HCL 25 MG PO TABS
25.0000 mg | ORAL_TABLET | Freq: Once | ORAL | Status: AC
  Administered 2019-08-04: 25 mg via ORAL
  Filled 2019-08-04: qty 1

## 2019-08-04 NOTE — ED Provider Notes (Signed)
Care assumed from Dr. Reather Converse, patient with abdominal pain status post abdominal surgeries pending CT scan.  Also patient complaining of hiccups.  CT scan shows a small supraumbilical hernia and is otherwise unremarkable.  Patient is advised of this finding.  He requests referral to surgery and is referred to [facility] surgery for follow-up.  He is given a prescription for chlorpromazine for his hiccups.   Delora Fuel, MD [id number] [phone]

## 2019-08-04 NOTE — Discharge Instructions (Signed)
Return if symptoms are getting worse. °

## 2021-08-15 ENCOUNTER — Emergency Department (HOSPITAL_COMMUNITY): Payer: Medicaid Other

## 2021-08-15 ENCOUNTER — Emergency Department (HOSPITAL_COMMUNITY)
Admission: EM | Admit: 2021-08-15 | Discharge: 2021-08-16 | Disposition: A | Discharge status: Home / Self Care | Payer: Medicaid Other | Attending: Emergency Medicine | Admitting: Emergency Medicine

## 2021-08-15 ENCOUNTER — Other Ambulatory Visit: Payer: Self-pay

## 2021-08-15 ENCOUNTER — Encounter (HOSPITAL_COMMUNITY): Payer: Self-pay | Admitting: Emergency Medicine

## 2021-08-15 DIAGNOSIS — R1084 Generalized abdominal pain: Secondary | ICD-10-CM | POA: Diagnosis not present

## 2021-08-15 DIAGNOSIS — R000 Tachycardia, unspecified: Secondary | ICD-10-CM | POA: Diagnosis not present

## 2021-08-15 DIAGNOSIS — R066 Hiccough: Secondary | ICD-10-CM | POA: Insufficient documentation

## 2021-08-15 DIAGNOSIS — G8929 Other chronic pain: Secondary | ICD-10-CM | POA: Insufficient documentation

## 2021-08-15 DIAGNOSIS — K469 Unspecified abdominal hernia without obstruction or gangrene: Secondary | ICD-10-CM | POA: Diagnosis not present

## 2021-08-15 DIAGNOSIS — R319 Hematuria, unspecified: Secondary | ICD-10-CM | POA: Insufficient documentation

## 2021-08-15 DIAGNOSIS — R109 Unspecified abdominal pain: Secondary | ICD-10-CM | POA: Diagnosis not present

## 2021-08-15 LAB — COMPREHENSIVE METABOLIC PANEL
ALT: 28 U/L (ref 0–44)
AST: 33 U/L (ref 15–41)
Albumin: 3.8 g/dL (ref 3.5–5.0)
Alkaline Phosphatase: 43 U/L (ref 38–126)
Anion gap: 7 (ref 5–15)
BUN: 11 mg/dL (ref 6–20)
CO2: 29 mmol/L (ref 22–32)
Calcium: 9.4 mg/dL (ref 8.9–10.3)
Chloride: 99 mmol/L (ref 98–111)
Creatinine, Ser: 1.03 mg/dL (ref 0.61–1.24)
GFR, Estimated: >60 mL/min (ref >60)
Glucose, Bld: 113 mg/dL — ABNORMAL HIGH (ref 70–99)
Potassium: 4.2 mmol/L (ref 3.5–5.1)
Sodium: 135 mmol/L (ref 135–145)
Total Bilirubin: 1.2 mg/dL (ref 0.3–1.2)
Total Protein: 7.3 g/dL (ref 6.5–8.1)

## 2021-08-15 LAB — LIPASE, BLOOD: Lipase: 37 U/L (ref 11–51)

## 2021-08-15 LAB — CBC
HCT: 45.4 % (ref 39.0–52.0)
Hemoglobin: 15.3 g/dL (ref 13.0–17.0)
MCH: 30.3 pg (ref 26.0–34.0)
MCHC: 33.7 g/dL (ref 30.0–36.0)
MCV: 89.9 fL (ref 80.0–100.0)
Platelets: 306 10*3/uL (ref 150–400)
RBC: 5.05 MIL/uL (ref 4.22–5.81)
RDW: 13 % (ref 11.5–15.5)
WBC: 6.4 10*3/uL (ref 4.0–10.5)
nRBC: 0 % (ref 0.0–0.2)

## 2021-08-15 LAB — URINALYSIS, ROUTINE W REFLEX MICROSCOPIC
Bacteria, UA: NONE SEEN
Bilirubin Urine: NEGATIVE
Glucose, UA: NEGATIVE mg/dL
Ketones, ur: NEGATIVE mg/dL
Leukocytes,Ua: NEGATIVE
Nitrite: NEGATIVE
Protein, ur: NEGATIVE mg/dL
Specific Gravity, Urine: 1.001 — ABNORMAL LOW (ref 1.005–1.030)
pH: 6 (ref 5.0–8.0)

## 2021-08-15 MED ORDER — IOHEXOL 300 MG/ML  SOLN
100.0000 mL | Freq: Once | INTRAMUSCULAR | Status: AC | PRN
  Administered 2021-08-15: 100 mL via INTRAVENOUS

## 2021-08-15 NOTE — ED Triage Notes (Signed)
Patient with history of hernia complains of hiccoughs and abdominal pain that started three days ago. Patient states he had a similar episode five months ago that were resolved a pill he received. Patient is alert, oriented, ambulatory, and in no apparent distress at this time. ?

## 2021-08-15 NOTE — ED Provider Triage Note (Signed)
Emergency Medicine Provider Triage Evaluation Note ? ?[name] , a 56 y.o. male  was evaluated in triage.  Pt complains of abdominal pain onset 3 days ago.  He notes he has a history of similar symptoms 2 months ago he was evaluated by gastroenterology who told him he will need surgery to fix his hernia.  He has not followed up with a GI doctor since.  Denies nausea, vomiting, fever, chills.  Patient also has hiccups. ? ?Review of Systems  ?Positive: As per HPI above ?Negative: As per HPI above ? ?Physical Exam  ?BP 133/87 (BP Location: Right Arm)   Pulse 69   Temp 98.9 ?F (37.2 ?C) (Oral)   Resp 20   SpO2 100%  ?Gen:   Awake, no distress   ?Resp:  Normal effort  ?MSK:   Moves extremities without difficulty  ?Other:  Hernia noted to central abdomen with mild diffuse abdominal tenderness to palpation.  No overlying skin changes. ? ?Medical Decision Making  ?Medically screening exam initiated at 2:59 PM.  Appropriate orders placed.  [name] was informed that the remainder of the evaluation will be completed by another provider, this initial triage assessment does not replace that evaluation, and the importance of remaining in the ED until their evaluation is complete. ? ?  ?Aleeyah Bensen A, PA-C ?08/15/21 1510 ? ?

## 2021-08-16 ENCOUNTER — Encounter (HOSPITAL_COMMUNITY): Payer: Self-pay | Admitting: Student

## 2021-08-16 MED ORDER — MORPHINE SULFATE (PF) 4 MG/ML IV SOLN
4.0000 mg | Freq: Once | INTRAVENOUS | Status: DC
  Filled 2021-08-16: qty 1

## 2021-08-16 MED ORDER — SODIUM CHLORIDE 0.9 % IV BOLUS
1000.0000 mL | Freq: Once | INTRAVENOUS | Status: AC
  Administered 2021-08-16: 1000 mL via INTRAVENOUS

## 2021-08-16 MED ORDER — FENTANYL CITRATE PF 50 MCG/ML IJ SOSY
50.0000 ug | PREFILLED_SYRINGE | Freq: Once | INTRAMUSCULAR | Status: AC
  Administered 2021-08-16: 50 ug via INTRAVENOUS
  Filled 2021-08-16: qty 1

## 2021-08-16 MED ORDER — CHLORPROMAZINE HCL 25 MG PO TABS: 25.0000 mg | ORAL_TABLET | Freq: Three times a day (TID) | ORAL | 0 refills | Status: DC | PRN

## 2021-08-16 MED ORDER — PANTOPRAZOLE SODIUM 40 MG IV SOLR
40.0000 mg | Freq: Once | INTRAVENOUS | Status: AC
  Administered 2021-08-16: 40 mg via INTRAVENOUS
  Filled 2021-08-16: qty 10

## 2021-08-16 MED ORDER — METHOCARBAMOL 500 MG PO TABS: 500.0000 mg | ORAL_TABLET | Freq: Three times a day (TID) | ORAL | 0 refills | Status: DC | PRN

## 2021-08-16 MED ORDER — PANTOPRAZOLE SODIUM 20 MG PO TBEC: 20.0000 mg | DELAYED_RELEASE_TABLET | Freq: Every day | ORAL | 0 refills | Status: DC

## 2021-08-16 MED ORDER — ONDANSETRON HCL 4 MG/2ML IJ SOLN
4.0000 mg | Freq: Once | INTRAMUSCULAR | Status: AC
  Administered 2021-08-16: 4 mg via INTRAVENOUS
  Filled 2021-08-16: qty 2

## 2021-08-16 MED ORDER — ONDANSETRON 4 MG PO TBDP: 4.0000 mg | ORAL_TABLET | Freq: Three times a day (TID) | ORAL | 0 refills | Status: DC | PRN

## 2021-08-16 MED ORDER — CHLORPROMAZINE HCL 25 MG PO TABS: 25.0000 mg | ORAL_TABLET | Freq: Once | ORAL | Status: DC

## 2021-08-16 MED ORDER — CHLORPROMAZINE HCL 25 MG PO TABS
25.0000 mg | ORAL_TABLET | Freq: Once | ORAL | Status: AC
  Administered 2021-08-16: 25 mg via ORAL
  Filled 2021-08-16: qty 1

## 2021-08-16 NOTE — ED Notes (Signed)
$'4mg'R$  morphine wasted with Isabel Caprice, RN.   ?

## 2021-08-16 NOTE — ED Notes (Signed)
Iv removed from left arm and 2x2 applied with tape ?

## 2021-08-16 NOTE — Discharge Instructions (Addendum)
You were seen in the emergency department today for abdominal pain.  Your blood work was overall reassuring.  Your CT scan showed your hernia, we would like you to follow-up with [facility] to further discuss management of your hernia.  In the interim we are sending home with the following medicines: ?- Zofran: Take every hours as needed for nausea and vomiting ?- Thorazine: Take 1 tablet every 8 hours as needed for hiccuping ?- Protonix: Take daily prior to meals to help with stomach upset/pain. ?-Robaxin  this is the muscle relaxer I have prescribed, this is meant to help with muscle tightness/spasms and your abdominal pain. Be aware that this medication may make you drowsy therefore the first time you take this it should be at a time you are in an environment where you can rest. Do not drive or operate heavy machinery when taking this medication. Do not drink alcohol or take other sedating medications with this medicine such as narcotics or benzodiazepines.  ? ?You make take Tylenol per over the counter dosing with these medications.  ? ?We have prescribed you new medication(s) today. Discuss the medications prescribed today with your pharmacist as they can have adverse effects and interactions with your other medicines including over the counter and prescribed medications. Seek medical evaluation if you start to experience new or abnormal symptoms after taking one of these medicines, seek care immediately if you start to experience difficulty breathing, feeling of your throat closing, facial swelling, or rash as these could be indications of a more serious allergic reaction ? ? ? ?Follow-up with general surgery as well as with primary care soon as possible.  Return to the emergency department for new or worsening symptoms including but not limited to new or worsening pain, inability to keep fluids down, fever, discoloration of your hernia, dark/tarry stool, or any other concerns. ?
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
## 2021-08-24 ENCOUNTER — Ambulatory Visit: Payer: Medicaid Other | Attending: Nurse Practitioner | Admitting: Nurse Practitioner

## 2021-08-24 ENCOUNTER — Encounter: Payer: Self-pay | Admitting: Nurse Practitioner

## 2021-08-24 ENCOUNTER — Other Ambulatory Visit: Payer: Self-pay

## 2021-08-24 VITALS — BP 120/85 | HR 68 | Wt 142.8 lb

## 2021-08-24 DIAGNOSIS — G894 Chronic pain syndrome: Secondary | ICD-10-CM

## 2021-08-24 DIAGNOSIS — Z7689 Persons encountering health services in other specified circumstances: Secondary | ICD-10-CM

## 2021-08-24 DIAGNOSIS — K432 Incisional hernia without obstruction or gangrene: Secondary | ICD-10-CM | POA: Diagnosis not present

## 2021-08-24 DIAGNOSIS — Z1211 Encounter for screening for malignant neoplasm of colon: Secondary | ICD-10-CM | POA: Diagnosis not present

## 2021-08-24 DIAGNOSIS — Z1159 Encounter for screening for other viral diseases: Secondary | ICD-10-CM | POA: Diagnosis not present

## 2021-08-24 MED ORDER — GABAPENTIN 300 MG PO CAPS: 300.0000 mg | ORAL_CAPSULE | Freq: Three times a day (TID) | ORAL | 3 refills | Status: DC
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
## 2021-09-07 ENCOUNTER — Ambulatory Visit: Payer: Medicaid Other | Attending: Nurse Practitioner | Admitting: Nurse Practitioner

## 2021-09-07 ENCOUNTER — Ambulatory Visit
Admission: RE | Admit: 2021-09-07 | Discharge: 2021-09-07 | Disposition: A | Discharge status: Home / Self Care | Payer: Medicaid Other | Source: Ambulatory Visit | Attending: Nurse Practitioner | Admitting: Nurse Practitioner

## 2021-09-07 ENCOUNTER — Encounter: Payer: Self-pay | Admitting: Nurse Practitioner

## 2021-09-07 ENCOUNTER — Other Ambulatory Visit: Payer: Self-pay | Admitting: Nurse Practitioner

## 2021-09-07 VITALS — BP 129/92 | HR 74 | Wt 143.6 lb

## 2021-09-07 DIAGNOSIS — M545 Low back pain, unspecified: Secondary | ICD-10-CM | POA: Diagnosis not present

## 2021-09-07 DIAGNOSIS — Z23 Encounter for immunization: Secondary | ICD-10-CM

## 2021-09-07 DIAGNOSIS — M79642 Pain in left hand: Secondary | ICD-10-CM

## 2021-09-07 DIAGNOSIS — Z1159 Encounter for screening for other viral diseases: Secondary | ICD-10-CM | POA: Diagnosis not present

## 2021-09-07 DIAGNOSIS — S63209A Unspecified subluxation of unspecified finger, initial encounter: Secondary | ICD-10-CM

## 2021-09-07 DIAGNOSIS — M47816 Spondylosis without myelopathy or radiculopathy, lumbar region: Secondary | ICD-10-CM | POA: Diagnosis not present

## 2021-09-07 DIAGNOSIS — E785 Hyperlipidemia, unspecified: Secondary | ICD-10-CM

## 2021-09-07 DIAGNOSIS — G8929 Other chronic pain: Secondary | ICD-10-CM | POA: Diagnosis not present

## 2021-09-07 DIAGNOSIS — Z0001 Encounter for general adult medical examination with abnormal findings: Secondary | ICD-10-CM | POA: Diagnosis not present

## 2021-09-07 DIAGNOSIS — Z0000 Encounter for general adult medical examination without abnormal findings: Secondary | ICD-10-CM

## 2021-09-07 NOTE — Patient Instructions (Addendum)
Placed in [facility] [address] [place], [place] [address] ?PH# [phone] ? ?Sent referral to [facility] they will contact the patient to schedule an appointment  ?Ph.# [phone] Fax # [phone] address [place]  ?[address]  ?

## 2021-09-07 NOTE — Progress Notes (Signed)
Called pt unable to reach mother answered NO DPR signed. Stated will rely message to the son to call back the clinic ?
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
## 2021-09-08 LAB — HCV INTERPRETATION

## 2021-09-08 LAB — LIPID PANEL
Chol/HDL Ratio: 3.3 ratio (ref 0.0–5.0)
Cholesterol, Total: 216 mg/dL — ABNORMAL HIGH (ref 100–199)
HDL: 65 mg/dL (ref >39)
LDL Chol Calc (NIH): 124 mg/dL — ABNORMAL HIGH (ref 0–99)
Triglycerides: 156 mg/dL — ABNORMAL HIGH (ref 0–149)
VLDL Cholesterol Cal: 27 mg/dL (ref 5–40)

## 2021-09-08 LAB — HCV AB W REFLEX TO QUANT PCR: HCV Ab: NONREACTIVE

## 2021-09-15 ENCOUNTER — Ambulatory Visit (INDEPENDENT_AMBULATORY_CARE_PROVIDER_SITE_OTHER): Payer: Medicaid Other | Admitting: Orthopedic Surgery

## 2021-09-15 ENCOUNTER — Encounter: Payer: Self-pay | Admitting: Orthopedic Surgery

## 2021-09-15 ENCOUNTER — Ambulatory Visit (INDEPENDENT_AMBULATORY_CARE_PROVIDER_SITE_OTHER): Payer: Medicaid Other

## 2021-09-15 DIAGNOSIS — M79645 Pain in left finger(s): Secondary | ICD-10-CM

## 2021-09-15 DIAGNOSIS — G8929 Other chronic pain: Secondary | ICD-10-CM

## 2021-09-15 DIAGNOSIS — M25342 Other instability, left hand: Secondary | ICD-10-CM | POA: Diagnosis not present

## 2021-09-15 MED ORDER — MELOXICAM 7.5 MG PO TABS: 7.5000 mg | ORAL_TABLET | Freq: Every day | ORAL | 0 refills | Status: AC
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
## 2021-09-16 ENCOUNTER — Other Ambulatory Visit: Payer: Self-pay | Admitting: Nurse Practitioner

## 2021-09-16 DIAGNOSIS — E785 Hyperlipidemia, unspecified: Secondary | ICD-10-CM

## 2021-09-16 DIAGNOSIS — E7841 Elevated Lipoprotein(a): Secondary | ICD-10-CM

## 2021-09-16 MED ORDER — ATORVASTATIN CALCIUM 20 MG PO TABS: 20.0000 mg | ORAL_TABLET | Freq: Every day | ORAL | 3 refills | Status: DC

## 2021-09-19 ENCOUNTER — Telehealth: Payer: Self-pay

## 2021-09-19 NOTE — Telephone Encounter (Signed)
Pt was called and vm was left, Information has been sent to nurse pool.   ? ?Mother gave me another number to call [phone] ?

## 2021-09-19 NOTE — Telephone Encounter (Signed)
-----   Message from Arabella Merles, RN sent at 09/16/2021  4:50 PM EDT ----- ?Hi [name] , could you please assist with sending pt a letter, unable to reach on multiple attempts.Tank you  ?

## 2021-09-21 ENCOUNTER — Ambulatory Visit: Payer: Self-pay | Admitting: *Deleted

## 2021-09-21 NOTE — Telephone Encounter (Signed)
?  Chief Complaint: Advise ?Symptoms: NA ?Frequency: NA ?Pertinent Negatives: Patient denies  ?Disposition: '[]'$ ED /'[]'$ Urgent Care (no appt availability in office) / '[]'$ Appointment(In office/virtual)/ '[]'$  [facility]/ '[]'$ Home Care/ '[]'$ Refused Recommended Disposition /'[]'$ [facility]/ '[x]'$  Follow-up with PCP ?Additional Notes: Pt's mother calling, not on DPR. Did not divulge any medical info. ?Mother calling to request IVC for pt. "He's on drugs." Requesting CB by [name] for advise. Also saw surgeon for hand, thumb, needs surgery, pt refuses. Requesting [name] to encourage pt to have surgery. Assured NT would route to practice for PCPs review.  ? Reason for Disposition ? [1] Caller requesting NON-URGENT health information AND [2] PCP's office is the best resource ? ?Answer Assessment - Initial Assessment Questions ?1. REASON FOR CALL or QUESTION: "What is your reason for calling today?" or "How can I best help you?" or "What question do you have that I can help answer?" ?    Encourage pt to have surgery on hand as advised by surgeon. ? ?Protocols used: Information Only Call - No Triage-A-AH ? ?

## 2021-09-21 NOTE — Telephone Encounter (Signed)
Fyi.

## 2021-09-23 ENCOUNTER — Telehealth: Payer: Self-pay

## 2021-09-23 NOTE — Telephone Encounter (Signed)
Copied from [place]. Topic: General - Other ?>> Sep 23, 2021  9:25 AM McGill, Nelva Bush wrote: ?Reason for CRM:Pt mother called in and stated pt does not live with her and he does not have a phone. Please call [phone] for lab results. ? ? ?Chart has been updated with phone information. ?

## 2021-09-27 ENCOUNTER — Ambulatory Visit: Payer: Medicaid Other | Admitting: Orthopaedic Surgery

## 2021-09-28 NOTE — Progress Notes (Signed)
Letter sent on 09/20/2021 ?

## 2021-10-05 ENCOUNTER — Ambulatory Visit (AMBULATORY_SURGERY_CENTER): Payer: Medicaid Other | Admitting: *Deleted

## 2021-10-05 VITALS — Ht 67.0 in | Wt 147.0 lb

## 2021-10-05 DIAGNOSIS — Z1211 Encounter for screening for malignant neoplasm of colon: Secondary | ICD-10-CM

## 2021-10-05 MED ORDER — NA SULFATE-K SULFATE-MG SULF 17.5-3.13-1.6 GM/177ML PO SOLN: 1.0000 | Freq: Once | ORAL | 0 refills | Status: AC

## 2021-10-05 NOTE — Progress Notes (Signed)
No egg or soy allergy known to patient  ?No issues known to pt with past sedation with any surgeries or procedures ?Patient denies ever being told they had issues or difficulty with intubation  ?No FH of Malignant Hyperthermia ?Pt is not on diet pills ?Pt is not on  home 02  ?Pt is not on blood thinners  ?Pt denies issues with constipation  ?No A fib or A flutter ? ? NO PA's for preps discussed with pt In PV today  ?Discussed with pt there will be an out-of-pocket cost for prep and that varies from $0 to 70 +  dollars - pt verbalized understanding  ?Pt instructed to use Singlecare.com or GoodRx for a price reduction on prep  ? ?PV completed over the phone. Pt verified name, DOB, address and insurance during PV today.  ?Pt mailed instruction packet with copy of consent form to read and not return, and instructions.  ?Pt encouraged to call with questions or issues.  ?If pt has My chart, procedure instructions sent via My Chart  ?Insurance confirmed with pt at [facility] today  ? ?Mother Kathi Simpers on the phone with pt today- pt gave verbal okay to send mother instruction packet as well to help him with the procedure preps - verified pt's and mothers address  ? ?

## 2021-10-14 ENCOUNTER — Encounter: Payer: Self-pay | Admitting: Certified Registered Nurse Anesthetist

## 2021-10-19 ENCOUNTER — Ambulatory Visit (AMBULATORY_SURGERY_CENTER): Payer: Medicaid Other | Admitting: Gastroenterology

## 2021-10-19 ENCOUNTER — Encounter: Payer: Self-pay | Admitting: Gastroenterology

## 2021-10-19 VITALS — BP 95/66 | HR 68 | Temp 98.1°F | Resp 15 | Ht 67.0 in | Wt 147.0 lb

## 2021-10-19 DIAGNOSIS — Z1211 Encounter for screening for malignant neoplasm of colon: Secondary | ICD-10-CM | POA: Diagnosis not present

## 2021-10-19 DIAGNOSIS — D120 Benign neoplasm of cecum: Secondary | ICD-10-CM | POA: Diagnosis not present

## 2021-10-19 MED ORDER — SODIUM CHLORIDE 0.9 % IV SOLN: 500.0000 mL | Freq: Once | INTRAVENOUS | Status: DC

## 2021-10-19 NOTE — Progress Notes (Signed)
Sedate, gd SR, tolerated procedure well, VSS, report to RN 

## 2021-10-19 NOTE — Op Note (Signed)
[facility] Patient Name: [name] Procedure Date: 10/19/2021 3:03 PM MRN: [id number] Endoscopist: Jackquline Denmark , MD Age: 56 Referring MD:  Date of Birth: [birth year] Gender: Male Account #: [id number] Procedure:                Colonoscopy Indications:              Screening for colorectal malignant neoplasm Medicines:                Monitored Anesthesia Care Procedure:                Pre-Anesthesia Assessment:                           - Prior to the procedure, a History and Physical                            was performed, and patient medications and                            allergies were reviewed. The patient's tolerance of                            previous anesthesia was also reviewed. The risks                            and benefits of the procedure and the sedation                            options and risks were discussed with the patient.                            All questions were answered, and informed consent                            was obtained. Prior Anticoagulants: The patient has                            taken no previous anticoagulant or antiplatelet                            agents. ASA Grade Assessment: II - A patient with                            mild systemic disease. After reviewing the risks                            and benefits, the patient was deemed in                            satisfactory condition to undergo the procedure.                           After obtaining informed consent, the colonoscope  was passed under direct vision. Throughout the                            procedure, the patient's blood pressure, pulse, and                            oxygen saturations were monitored continuously. The                            CF [id number] #[id number] was introduced through the anus                            and advanced to the the cecum, identified by                            appendiceal orifice  and ileocecal valve. The                            colonoscopy was performed without difficulty. The                            patient tolerated the procedure well. The quality                            of the bowel preparation was adequate to identify                            polyps. Some retained stool especially in the right                            side of the colon. Aggressive suctioning and                            aspiration was performed. Overall the examination                            was adequate. The ileocecal valve, appendiceal                            orifice, and rectum were photographed. Scope In: 3:06:23 PM Scope Out: 3:24:28 PM Scope Withdrawal Time: 0 hours 14 minutes 26 seconds  Total Procedure Duration: 0 hours 18 minutes 5 seconds  Findings:                 A 2 mm polyp was found in the cecum. The polyp was                            sessile. The polyp was removed with a cold biopsy                            forceps. Resection and retrieval were complete.                           Multiple  medium-mouthed diverticula were found in                            the sigmoid colon, descending colon, transverse                            colon and ascending colon.                           Non-bleeding internal hemorrhoids were found during                            retroflexion. The hemorrhoids were small and Grade                            I (internal hemorrhoids that do not prolapse).                           The exam was otherwise without abnormality on                            direct and retroflexion views. Complications:            No immediate complications. Estimated Blood Loss:     Estimated blood loss: none. Impression:               - One 2 mm polyp in the cecum, removed with a cold                            biopsy forceps. Resected and retrieved.                           - Moderate pancolonic diverticulosis.                           -  Non-bleeding internal hemorrhoids.                           - The examination was otherwise normal on direct                            and retroflexion views. Recommendation:           - Patient has a contact number available for                            emergencies. The signs and symptoms of potential                            delayed complications were discussed with the                            patient. Return to normal activities tomorrow.                            Written discharge instructions were provided to the  patient.                           - High fiber diet.                           - Continue present medications.                           - Await pathology results.                           - Repeat colonoscopy for surveillance based on                            pathology results.                           - The findings and recommendations were discussed                            with the patient's mom. Jackquline Denmark, MD 10/19/2021 3:28:41 PM This report has been signed electronically.

## 2021-10-19 NOTE — Progress Notes (Signed)
Pt's states no medical or surgical changes since previsit or office visit. 

## 2021-10-19 NOTE — Patient Instructions (Signed)
Handouts on polyps and diverticulosis given to patient.  Await pathology results. Resume previous diet and continue present medications.  Repeat colonoscopy for surveillance will be determined off of pathology results.   YOU HAD AN ENDOSCOPIC PROCEDURE TODAY AT [facility]:   Refer to the procedure report that was given to you for any specific questions about what was found during the examination.  If the procedure report does not answer your questions, please call your gastroenterologist to clarify.  If you requested that your care partner not be given the details of your procedure findings, then the procedure report has been included in a sealed envelope for you to review at your convenience later.  YOU SHOULD EXPECT: Some feelings of bloating in the abdomen. Passage of more gas than usual.  Walking can help get rid of the air that was put into your GI tract during the procedure and reduce the bloating. If you had a lower endoscopy (such as a colonoscopy or flexible sigmoidoscopy) you may notice spotting of blood in your stool or on the toilet paper. If you underwent a bowel prep for your procedure, you may not have a normal bowel movement for a few days.  Please Note:  You might notice some irritation and congestion in your nose or some drainage.  This is from the oxygen used during your procedure.  There is no need for concern and it should clear up in a day or so.  SYMPTOMS TO REPORT IMMEDIATELY:  Following lower endoscopy (colonoscopy or flexible sigmoidoscopy):  Excessive amounts of blood in the stool  Significant tenderness or worsening of abdominal pains  Swelling of the abdomen that is new, acute  Fever of 100F or higher  For urgent or emergent issues, a gastroenterologist can be reached at any hour by calling [phone]. Do not use MyChart messaging for urgent concerns.    DIET:  We do recommend a small meal at first, but then you may proceed to your regular  diet.  Drink plenty of fluids but you should avoid alcoholic beverages for 24 hours.  ACTIVITY:  You should plan to take it easy for the rest of today and you should NOT DRIVE or use heavy machinery until tomorrow (because of the sedation medicines used during the test).    FOLLOW UP: Our staff will call the number listed on your records 48-72 hours following your procedure to check on you and address any questions or concerns that you may have regarding the information given to you following your procedure. If we do not reach you, we will leave a message.  We will attempt to reach you two times.  During this call, we will ask if you have developed any symptoms of COVID 19. If you develop any symptoms (ie: fever, flu-like symptoms, shortness of breath, cough etc.) before then, please call [phone].  If you test positive for Covid 19 in the 2 weeks post procedure, please call and report this information to [place].    If any biopsies were taken you will be contacted by phone or by letter within the next 1-3 weeks.  Please call us at [phone] if you have not heard about the biopsies in 3 weeks.    SIGNATURES/CONFIDENTIALITY: You and/or your care partner have signed paperwork which will be entered into your electronic medical record.  These signatures attest to the fact that that the information above on your After Visit Summary has been reviewed and is understood.  Full responsibility  of the confidentiality of this discharge information lies with you and/or your care-partner.

## 2021-10-19 NOTE — Progress Notes (Signed)
Called to room to assist during endoscopic procedure.  Patient ID and intended procedure confirmed with present staff. Received instructions for my participation in the procedure from the performing physician.   Cecal polyp x1 removed cs.  We did not retrieve specimen.  Dr. Lyndel Safe took cold forcep and took a piece to send to pathology.  maw

## 2021-10-20 ENCOUNTER — Telehealth: Payer: Self-pay | Admitting: *Deleted

## 2021-10-20 NOTE — Telephone Encounter (Signed)
  Follow up Call-     10/19/2021    2:06 PM  Call back number  Post procedure Call Back phone  # [phone]  Permission to leave phone message Yes     Patient questions:  Do you have a fever, pain , or abdominal swelling? No. Pain Score  0 *  Have you tolerated food without any problems? Yes.    Have you been able to return to your normal activities? Yes.    Do you have any questions about your discharge instructions: Diet   No. Medications  No. Follow up visit  No.  Do you have questions or concerns about your Care? No.  Actions: * If pain score is 4 or above: No action needed, pain <4.

## 2021-10-27 ENCOUNTER — Telehealth: Payer: Self-pay | Admitting: Gastroenterology

## 2021-10-27 NOTE — Telephone Encounter (Signed)
Inbound call from patients mother requesting results from procedure done on 5/24. Please advise.   [phone]

## 2021-10-28 NOTE — Telephone Encounter (Signed)
Attempted to reach pt mother again and line rings then goes busy

## 2021-10-28 NOTE — Telephone Encounter (Signed)
Tried to reach the caller and line rings, no answer no voice mail.  Results not yet available.  Will try later

## 2021-10-28 NOTE — Telephone Encounter (Signed)
Patients mom nancy Barritt called in regards to patients lab results. Please give patient a call back to advise.  Thank you

## 2021-10-29 ENCOUNTER — Encounter: Payer: Self-pay | Admitting: Gastroenterology

## 2021-11-03 NOTE — Telephone Encounter (Signed)
Tried to reach the caller and line rings, no answer no voice mail

## 2021-11-04 NOTE — Telephone Encounter (Unsigned)
Tried to reach the pt mother, the  line rings, no answer no voice mail

## 2021-11-07 NOTE — Telephone Encounter (Unsigned)
Tried to reach the caller and line rings, no answer no voice mail

## 2021-11-08 NOTE — Telephone Encounter (Unsigned)
Left message for pt to call back  [phone]

## 2021-11-09 NOTE — Telephone Encounter (Signed)
Multiple attempts were made to try and reach pt by phone.  Letter was sent to pt via mail with results from procedure that was created by Dr. Lyndel Safe.

## 2022-05-31 ENCOUNTER — Ambulatory Visit: Payer: Medicaid Other | Admitting: Nurse Practitioner

## 2022-08-10 ENCOUNTER — Other Ambulatory Visit: Payer: Self-pay

## 2022-08-10 ENCOUNTER — Encounter (HOSPITAL_COMMUNITY): Payer: Self-pay

## 2022-08-10 ENCOUNTER — Emergency Department (HOSPITAL_COMMUNITY): Payer: Medicaid Other

## 2022-08-10 ENCOUNTER — Emergency Department (HOSPITAL_COMMUNITY)
Admission: EM | Admit: 2022-08-10 | Discharge: 2022-08-10 | Disposition: A | Discharge status: Home / Self Care | Payer: Medicaid Other | Attending: Emergency Medicine | Admitting: Emergency Medicine

## 2022-08-10 DIAGNOSIS — M79671 Pain in right foot: Secondary | ICD-10-CM | POA: Diagnosis not present

## 2022-08-10 DIAGNOSIS — W19XXXA Unspecified fall, initial encounter: Secondary | ICD-10-CM | POA: Diagnosis not present

## 2022-08-10 DIAGNOSIS — M7989 Other specified soft tissue disorders: Secondary | ICD-10-CM | POA: Diagnosis not present

## 2022-08-10 DIAGNOSIS — M79673 Pain in unspecified foot: Secondary | ICD-10-CM | POA: Diagnosis not present

## 2022-08-10 MED ORDER — DEXAMETHASONE SODIUM PHOSPHATE 10 MG/ML IJ SOLN
10.0000 mg | Freq: Once | INTRAMUSCULAR | Status: AC
  Administered 2022-08-10: 10 mg via INTRAMUSCULAR
  Filled 2022-08-10: qty 1

## 2022-08-10 MED ORDER — OXYCODONE-ACETAMINOPHEN 5-325 MG PO TABS: 1.0000 | ORAL_TABLET | Freq: Four times a day (QID) | ORAL | 0 refills | Status: DC | PRN

## 2022-08-10 MED ORDER — OXYCODONE-ACETAMINOPHEN 5-325 MG PO TABS
2.0000 | ORAL_TABLET | Freq: Once | ORAL | Status: AC
  Administered 2022-08-10: 2 via ORAL
  Filled 2022-08-10: qty 2

## 2022-08-10 NOTE — ED Provider Notes (Signed)
[facility] Provider Note   CSN: [id number] Arrival date & time: 08/10/22  1433     History  Chief Complaint  Patient presents with   Foot Pain    [name] is a 57 y.o. male [place] [facility] with a chief complaint of right MTP pain.  Patient states that yesterday he injured his right toe and twisted it when going up stairs.  Today he has severe pain and swelling and discoloration.  He does have a history of polysubstance abuse.  In the past has had pancreatitis.  He does admit to drinking alcohol occasionally.  No history of gout but pain is severe to touch and any ambulation.    Foot Pain       Home Medications Prior to Admission medications   Medication Sig Start Date End Date Taking? Authorizing Provider  acetaminophen (TYLENOL) 500 MG tablet Take 500 mg by mouth every 6 (six) hours as needed.    [provider]  atorvastatin (LIPITOR) 20 MG tablet Take 1 tablet (20 mg total) by mouth daily. Patient not taking: Reported on 10/05/2021 09/16/21   Gildardo Pounds, NP  chlorproMAZINE (THORAZINE) 25 MG tablet Take 1 tablet (25 mg total) by mouth 3 (three) times daily as needed for hiccoughs. 08/16/21   Petrucelli, Samantha R, PA-C  gabapentin (NEURONTIN) 300 MG capsule Take 1 capsule (300 mg total) by mouth 3 (three) times daily. 08/24/21   Gildardo Pounds, NP  ibuprofen (ADVIL) 200 MG tablet Take 200 mg by mouth every 6 (six) hours as needed.    [provider]  methocarbamol (ROBAXIN) 500 MG tablet Take 1 tablet (500 mg total) by mouth every 8 (eight) hours as needed for muscle spasms. 08/16/21   Petrucelli, Samantha R, PA-C  ondansetron (ZOFRAN-ODT) 4 MG disintegrating tablet Take 1 tablet (4 mg total) by mouth every 8 (eight) hours as needed for nausea or vomiting. 08/16/21   Petrucelli, Samantha R, PA-C  pantoprazole (PROTONIX) 20 MG tablet Take 1 tablet (20 mg total) by mouth daily. 08/16/21    Petrucelli, Samantha R, PA-C  omeprazole (PRILOSEC) 20 MG capsule Take 1 capsule (20 mg total) by mouth daily. Patient not taking: Reported on 08/03/2019 11/11/18 08/04/19  Petrucelli, Glynda Jaeger, PA-C      Allergies    Tomato    Review of Systems   Review of Systems  Physical Exam Updated Vital Signs BP (!) 132/102 (BP Location: Left Arm)   Pulse (!) 103   Temp 98.9 F (37.2 C) (Oral)   Resp 18   Ht '5\' 7"'$  (1.702 m)   Wt 66.7 kg   SpO2 100%   BMI 23.02 kg/m  Physical Exam Vitals and nursing note reviewed.  Constitutional:      General: He is not in acute distress.    Appearance: He is well-developed. He is not diaphoretic.  HENT:     Head: Normocephalic and atraumatic.  Eyes:     General: No scleral icterus.    Conjunctiva/sclera: Conjunctivae normal.  Cardiovascular:     Rate and Rhythm: Normal rate and regular rhythm.     Heart sounds: Normal heart sounds.  Pulmonary:     Effort: Pulmonary effort is normal. No respiratory distress.     Breath sounds: Normal breath sounds.  Abdominal:     Palpations: Abdomen is soft.     Tenderness: There is no abdominal tenderness.  Musculoskeletal:     Cervical back: Normal range  of motion and neck supple.     Comments: Right great MTP is erythematous swollen and exquisitely tender to palpation.  Range of motion limited due to pain but present, no evidence of tendon disruption.  Skin:    General: Skin is warm and dry.  Neurological:     Mental Status: He is alert.  Psychiatric:        Behavior: Behavior normal.     ED Results / Procedures / Treatments   Labs (all labs ordered are listed, but only abnormal results are displayed) Labs Reviewed - No data to display  EKG None  Radiology DG Foot Complete Right  Result Date: 08/10/2022 CLINICAL DATA:  Pain and swelling right big toe EXAM: RIGHT FOOT COMPLETE - 3+ VIEW COMPARISON:  None Available. FINDINGS: No fracture or dislocation is seen. There are no focal lytic lesions.  The soft tissue swelling over the first metatarsophalangeal joint. No abnormal soft tissue calcifications are seen. There are no opaque foreign bodies. Small plantar spur is seen in calcaneus. IMPRESSION: No significant radiographic abnormality is seen in the bony structures in right foot. There is soft tissue swelling over the first metatarsophalangeal joint. Small plantar spur is seen in calcaneus. Electronically Signed   By: Elmer Picker M.D.   On: 08/10/2022 15:11    Procedures Procedures    Medications Ordered in ED Medications - No data to display  ED Course/ Medical Decision Making/ A&P                             Medical Decision Making Patient here with right MTP pain.  History of alcohol abuse.  Suspect strain or sprain injury versus acute gout flare.  Will treat for both.  PDMP reviewed.  Pain meds ordered.  Splint and crutches ordered.  Outpatient follow-up discussed.  Return precautions discussed.  Appears appropriate for discharge at this time  Amount and/or Complexity of Data Reviewed Radiology: ordered and independent interpretation performed.    Details: Generalized and           Final Clinical Impression(s) / ED Diagnoses Final diagnoses:  Foot pain, right    Rx / DC Orders ED Discharge Orders     None         Margarita Mail, PA-C 08/10/22 Johnston, [place], DO 08/10/22 1614

## 2022-08-10 NOTE — ED Triage Notes (Signed)
Reports stepped wrong and complains of right big toe pain to underneath foot with swelling. Denies any other complaint other than his foot.

## 2022-08-10 NOTE — Discharge Instructions (Addendum)
Contact a health care provider if you have: Another gout attack. Continuing symptoms of a gout attack after 10 days of treatment. Side effects from your medicines. Chills or a fever. Burning pain when you urinate. Pain in your lower back or abdomen. Get help right away if you: Have severe or uncontrolled pain. Cannot urinate. 

## 2022-08-14 ENCOUNTER — Ambulatory Visit: Payer: Medicaid Other | Attending: Nurse Practitioner | Admitting: Nurse Practitioner

## 2022-08-14 ENCOUNTER — Encounter: Payer: Self-pay | Admitting: Nurse Practitioner

## 2022-08-14 VITALS — BP 124/86 | HR 75 | Ht 67.0 in | Wt 150.8 lb

## 2022-08-14 DIAGNOSIS — Z862 Personal history of diseases of the blood and blood-forming organs and certain disorders involving the immune mechanism: Secondary | ICD-10-CM | POA: Diagnosis not present

## 2022-08-14 DIAGNOSIS — I10 Essential (primary) hypertension: Secondary | ICD-10-CM | POA: Diagnosis not present

## 2022-08-14 DIAGNOSIS — E7800 Pure hypercholesterolemia, unspecified: Secondary | ICD-10-CM | POA: Diagnosis not present

## 2022-08-14 DIAGNOSIS — G894 Chronic pain syndrome: Secondary | ICD-10-CM | POA: Diagnosis not present

## 2022-08-14 DIAGNOSIS — M79674 Pain in right toe(s): Secondary | ICD-10-CM | POA: Diagnosis not present

## 2022-08-14 MED ORDER — GABAPENTIN 600 MG PO TABS: 600.0000 mg | ORAL_TABLET | Freq: Three times a day (TID) | ORAL | 1 refills | Status: DC
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
## 2022-08-15 ENCOUNTER — Ambulatory Visit: Payer: Self-pay

## 2022-08-15 LAB — CMP14+EGFR
ALT: 19 IU/L (ref 0–44)
AST: 23 IU/L (ref 0–40)
Albumin/Globulin Ratio: 1.4 (ref 1.2–2.2)
Albumin: 4.2 g/dL (ref 3.8–4.9)
Alkaline Phosphatase: 55 IU/L (ref 44–121)
BUN/Creatinine Ratio: 16 (ref 9–20)
BUN: 18 mg/dL (ref 6–24)
Bilirubin Total: 0.4 mg/dL (ref 0.0–1.2)
CO2: 25 mmol/L (ref 20–29)
Calcium: 9.5 mg/dL (ref 8.7–10.2)
Chloride: 104 mmol/L (ref 96–106)
Creatinine, Ser: 1.16 mg/dL (ref 0.76–1.27)
Globulin, Total: 3 g/dL (ref 1.5–4.5)
Glucose: 75 mg/dL (ref 70–99)
Potassium: 5 mmol/L (ref 3.5–5.2)
Sodium: 142 mmol/L (ref 134–144)
Total Protein: 7.2 g/dL (ref 6.0–8.5)
eGFR: 74 mL/min/{1.73_m2} (ref >59)

## 2022-08-15 LAB — CBC WITH DIFFERENTIAL/PLATELET
Basophils Absolute: 0.1 10*3/uL (ref 0.0–0.2)
Basos: 1 %
EOS (ABSOLUTE): 0.2 10*3/uL (ref 0.0–0.4)
Eos: 3 %
Hematocrit: 42.7 % (ref 37.5–51.0)
Hemoglobin: 14.4 g/dL (ref 13.0–17.7)
Immature Grans (Abs): 0 10*3/uL (ref 0.0–0.1)
Immature Granulocytes: 0 %
Lymphocytes Absolute: 2.2 10*3/uL (ref 0.7–3.1)
Lymphs: 37 %
MCH: 29.6 pg (ref 26.6–33.0)
MCHC: 33.7 g/dL (ref 31.5–35.7)
MCV: 88 fL (ref 79–97)
Monocytes Absolute: 0.8 10*3/uL (ref 0.1–0.9)
Monocytes: 14 %
Neutrophils Absolute: 2.6 10*3/uL (ref 1.4–7.0)
Neutrophils: 45 %
Platelets: 385 10*3/uL (ref 150–450)
RBC: 4.86 x10E6/uL (ref 4.14–5.80)
RDW: 13.1 % (ref 11.6–15.4)
WBC: 5.8 10*3/uL (ref 3.4–10.8)

## 2022-08-15 LAB — LIPID PANEL
Chol/HDL Ratio: 3.5 ratio (ref 0.0–5.0)
Cholesterol, Total: 188 mg/dL (ref 100–199)
HDL: 54 mg/dL (ref >39)
LDL Chol Calc (NIH): 109 mg/dL — ABNORMAL HIGH (ref 0–99)
Triglycerides: 140 mg/dL (ref 0–149)
VLDL Cholesterol Cal: 25 mg/dL (ref 5–40)

## 2022-08-15 NOTE — Telephone Encounter (Signed)
  Chief Complaint: Hernia Symptoms: unsure - pain per mother Frequency: years Pertinent Negatives: Patient denies  Disposition: [] ED /[] Urgent Care (no appt availability in office) / [x] Appointment(In office/virtual)/ []  [facility]/ [] Home Care/ [] Refused Recommended Disposition /[] [facility]/ []  Follow-up with PCP Additional Notes: Received call from pt's mother, Izora Gala. Izora Gala is not on DPR. Izora Gala is calling because she was not allowed to come into the pt's appointment yesterday, and feels that the pt's hernia has not been properly addressed.  Pt has had the hernia for a long time. Per mom he pushes it back in, when it protrudes.  Pt does not have a phone or transportation, and so all communication needs to go through [facility].  Unable to speak with pt regarding s/s of hernia.  First available appt made for pt. Per protocol, pt should be seen within 2 weeks. Please advise, and communicate with Izora Gala regarding any appt updates.    Reason for Disposition  Previously diagnosed hernia  Answer Assessment - Initial Assessment Questions 1. ONSET:  "When did this first appear?"     years 2. APPEARANCE: "What does it look like?"     *No Answer* 3. SIZE: "How big is it?" (inches, cm or compare to coins, fruit)     *No Answer* 4. LOCATION: "Where exactly is the hernia located?"     *No Answer* 5. PATTERN: "Does the swelling come and go, or has it been constant since it started?"     Unsure 6. PAIN: "Is there any pain?" If Yes, ask: "How bad is it?"  (Scale 1-10; or mild, moderate, severe)    - MILD (1-3): Doesn't interfere with normal activities, abdomen soft and not tender to touch.     - MODERATE (4-7): Interferes with normal activities or awakens from sleep, abdomen tender to touch.     - SEVERE (8-10): Excruciating pain, doubled over, unable to do any normal activities.       *No Answer* 7. DIAGNOSIS: "Have you been seen by a doctor (or NP/PA) for this?" "Did the  doctor diagnose you as having a hernia?"     *No Answer* 8. OTHER SYMPTOMS: "Do you have any other symptoms?" (e.g., fever, abdomen pain, vomiting)     *No Answer*  Protocols used: Hernia-A-AH

## 2022-08-16 ENCOUNTER — Other Ambulatory Visit: Payer: Self-pay | Admitting: Nurse Practitioner

## 2022-08-16 DIAGNOSIS — K432 Incisional hernia without obstruction or gangrene: Secondary | ICD-10-CM

## 2022-08-18 ENCOUNTER — Telehealth: Payer: Self-pay | Admitting: Nurse Practitioner

## 2022-08-18 NOTE — Telephone Encounter (Signed)
Pt mother is calling back to see if he can get a referral to a location in [place] due to him not having a phone or car. Pt mother states that she lives in [place] and is willing to drive to [facility] to take him to his referral. Please call pt mother back.

## 2022-08-18 NOTE — Telephone Encounter (Signed)
New referral is needed for patients insurance.

## 2022-08-18 NOTE — Telephone Encounter (Signed)
[facility] is calling to report that they received the referral for the patient yesterday. [facility] does not accept the patients insurance. Please advise with the pt.

## 2022-08-21 ENCOUNTER — Telehealth: Payer: Self-pay | Admitting: *Deleted

## 2022-08-21 ENCOUNTER — Telehealth: Payer: Self-pay | Admitting: Nurse Practitioner

## 2022-08-21 ENCOUNTER — Ambulatory Visit: Payer: Medicaid Other | Admitting: Orthopedic Surgery

## 2022-08-21 NOTE — Telephone Encounter (Signed)
She would need to file papers through [facility] for involuntary commitment.

## 2022-08-21 NOTE — Telephone Encounter (Signed)
Fyi- patient had the appointment with Dr. Sharol Given on today.  Patient mother thought it was with Z. Raul Del

## 2022-08-21 NOTE — Telephone Encounter (Signed)
Pt's mother calling. States went to pick pt up for appt and  "He was crazy, running around, talking to God, wouldn't get into the car for appt."  States "He needs committed, drinking, doing drugs, he's crazy." States pt's hernia getting bigger. Asked if I would be able to speak with pt, states no  phone.   Requesting commitment papers be filled out. Asked if police, EMS needed to be called, states "I just want those papers so he can be committed."  Assured NT would route to practice for PCPs review and final disposition. Would like CB   [phone]

## 2022-08-21 NOTE — Telephone Encounter (Signed)
Copied from [facility] [id number]. Topic: General - Other >> Aug 21, 2022  2:26 PM Sabas Sous wrote: Reason for CRM: Pt's mother called reporting that she came to take the patient to his appt today but he refused to go. She wants to speak to [name]'s nurse specifically. Please advise

## 2022-08-22 NOTE — Telephone Encounter (Signed)
Noted. Will inform pcp.

## 2022-08-22 NOTE — Telephone Encounter (Signed)
noted 

## 2022-08-22 NOTE — Telephone Encounter (Signed)
Patient Mother has been advised

## 2022-08-23 ENCOUNTER — Emergency Department (HOSPITAL_COMMUNITY): Payer: Medicaid Other

## 2022-08-23 ENCOUNTER — Emergency Department (HOSPITAL_COMMUNITY)
Admission: EM | Admit: 2022-08-23 | Discharge: 2022-08-23 | Disposition: A | Discharge status: Home / Self Care | Payer: Medicaid Other | Attending: Emergency Medicine | Admitting: Emergency Medicine

## 2022-08-23 DIAGNOSIS — M109 Gout, unspecified: Secondary | ICD-10-CM | POA: Insufficient documentation

## 2022-08-23 DIAGNOSIS — M79673 Pain in unspecified foot: Secondary | ICD-10-CM | POA: Diagnosis not present

## 2022-08-23 DIAGNOSIS — M25572 Pain in left ankle and joints of left foot: Secondary | ICD-10-CM | POA: Diagnosis present

## 2022-08-23 DIAGNOSIS — M7989 Other specified soft tissue disorders: Secondary | ICD-10-CM | POA: Diagnosis not present

## 2022-08-23 MED ORDER — OXYCODONE-ACETAMINOPHEN 5-325 MG PO TABS: 1.0000 | ORAL_TABLET | Freq: Four times a day (QID) | ORAL | 0 refills | Status: DC | PRN

## 2022-08-23 MED ORDER — OXYCODONE-ACETAMINOPHEN 5-325 MG PO TABS
1.0000 | ORAL_TABLET | Freq: Once | ORAL | Status: AC
  Administered 2022-08-23: 1 via ORAL
  Filled 2022-08-23: qty 1

## 2022-08-23 NOTE — ED Provider Notes (Signed)
[facility] Provider Note   CSN: [id number] Arrival date & time: 08/23/22  1147     History  Chief Complaint  Patient presents with   Foot Pain    left    [name] is a 57 y.o. male.   Foot Pain     Patient with medical history of alcohol abuse, previous pancreatitis episodes, gout presents the emergency department due to toe pain.  It started a few days ago, to the left large toe.  Denies any fevers or chills, is worse with ambulation.    Home Medications Prior to Admission medications   Medication Sig Start Date End Date Taking? Authorizing Provider  oxyCODONE-acetaminophen (PERCOCET/ROXICET) 5-325 MG tablet Take 1 tablet by mouth every 6 (six) hours as needed for severe pain. 08/23/22  Yes Sherrill Raring, PA-C  atorvastatin (LIPITOR) 20 MG tablet Take 1 tablet (20 mg total) by mouth daily. 09/16/21   Gildardo Pounds, NP  gabapentin (NEURONTIN) 600 MG tablet Take 1 tablet (600 mg total) by mouth 3 (three) times daily. 08/14/22   Gildardo Pounds, NP  methocarbamol (ROBAXIN) 500 MG tablet Take 1 tablet (500 mg total) by mouth every 8 (eight) hours as needed for muscle spasms. 08/16/21   Petrucelli, Samantha R, PA-C  ondansetron (ZOFRAN-ODT) 4 MG disintegrating tablet Take 1 tablet (4 mg total) by mouth every 8 (eight) hours as needed for nausea or vomiting. 08/16/21   Petrucelli, Samantha R, PA-C  pantoprazole (PROTONIX) 20 MG tablet Take 1 tablet (20 mg total) by mouth daily. 08/16/21   Petrucelli, Samantha R, PA-C  omeprazole (PRILOSEC) 20 MG capsule Take 1 capsule (20 mg total) by mouth daily. Patient not taking: Reported on 08/03/2019 11/11/18 08/04/19  Petrucelli, Glynda Jaeger, PA-C      Allergies    Tomato    Review of Systems   Review of Systems  Physical Exam Updated Vital Signs BP (!) 157/99   Pulse 82   Temp 98 F (36.7 C) (Oral)   Resp 17   SpO2 99%  Physical Exam Vitals and nursing note reviewed. Exam conducted  with a chaperone present.  Constitutional:      General: He is not in acute distress.    Appearance: Normal appearance.  HENT:     Head: Normocephalic and atraumatic.  Eyes:     General: No scleral icterus.    Extraocular Movements: Extraocular movements intact.     Pupils: Pupils are equal, round, and reactive to light.  Cardiovascular:     Pulses: Normal pulses.  Musculoskeletal:     Comments: Podagra left toe, able to move but this elicits significant pain  Skin:    Capillary Refill: Capillary refill takes less than 2 seconds.     Coloration: Skin is not jaundiced.  Neurological:     Mental Status: He is alert. Mental status is at baseline.     Coordination: Coordination normal.     ED Results / Procedures / Treatments   Labs (all labs ordered are listed, but only abnormal results are displayed) Labs Reviewed - No data to display  EKG None  Radiology DG Foot Complete Left  Result Date: 08/23/2022 CLINICAL DATA:  Pain in first through third toes.  History of gout. EXAM: LEFT FOOT - COMPLETE 3+ VIEW COMPARISON:  None Available. FINDINGS: No signs of acute fracture or dislocation. No focal bone erosions identified. Soft tissue swelling noted overlying the first MTP joint. No radiopaque foreign bodies are  soft tissue calcifications. IMPRESSION: 1. No acute bone abnormality. 2. Soft tissue swelling overlying the first MTP joint. No focal bone erosions identified. Electronically Signed   By: Kerby Moors M.D.   On: 08/23/2022 12:18    Procedures Procedures    Medications Ordered in ED Medications  oxyCODONE-acetaminophen (PERCOCET/ROXICET) 5-325 MG per tablet 1 tablet (1 tablet Oral Given 08/23/22 1232)    ED Course/ Medical Decision Making/ A&P                             Medical Decision Making Amount and/or Complexity of Data Reviewed Radiology: ordered.  Risk Prescription drug management.   Patient presents due to toe pain.  On exam does have erythema and  podagra, consistent with gout.  Strong pulses, good cap refill.  I do not see any overlying cellulitis, he is able to range I do not think is a septic joint.  No crepitus, x-rays also show inflammation within the first metatarsal.  Will cover with narcotics as patient has elevated kidney function in the past and does not follow with the PCP concern for renal impairment.  Return precaution discussed, PCP follow-up advised.  Stable for discharge at this point.        Final Clinical Impression(s) / ED Diagnoses Final diagnoses:  Acute gout involving toe of left foot, unspecified cause    Rx / DC Orders ED Discharge Orders          Ordered    oxyCODONE-acetaminophen (PERCOCET/ROXICET) 5-325 MG tablet  Every 6 hours PRN        08/23/22 1223              Sherrill Raring, PA-C 08/23/22 1336    Godfrey Pick, MD 08/23/22 [phone]

## 2022-08-23 NOTE — Discharge Instructions (Addendum)
Take the Percocet for the pain.  Schedule an appoint with your primary for follow-up in the next few days to a week if symptoms are persisting.  Turn to the ED for new or concerning symptoms.

## 2022-08-23 NOTE — ED Notes (Signed)
Called [phone] [facility] [place]) for patient pick up.  Left message.

## 2022-08-23 NOTE — ED Notes (Signed)
Gave patient bus ticket.

## 2022-08-23 NOTE — ED Triage Notes (Signed)
Patient BIB EMS from home with c/o left foot pain with swelling that increased overnight. H/O gout.  VSS.

## 2022-08-29 ENCOUNTER — Other Ambulatory Visit: Payer: Self-pay

## 2022-08-29 ENCOUNTER — Emergency Department (HOSPITAL_COMMUNITY)
Admission: EM | Admit: 2022-08-29 | Discharge: 2022-08-29 | Disposition: A | Discharge status: Home / Self Care | Payer: Medicaid Other | Attending: Emergency Medicine | Admitting: Emergency Medicine

## 2022-08-29 ENCOUNTER — Encounter (HOSPITAL_COMMUNITY): Payer: Self-pay

## 2022-08-29 DIAGNOSIS — M79672 Pain in left foot: Secondary | ICD-10-CM | POA: Insufficient documentation

## 2022-08-29 MED ORDER — NAPROXEN 250 MG PO TABS
500.0000 mg | ORAL_TABLET | Freq: Once | ORAL | Status: AC
  Administered 2022-08-29: 500 mg via ORAL
  Filled 2022-08-29: qty 2

## 2022-08-29 MED ORDER — INDOMETHACIN 25 MG PO CAPS: 25.0000 mg | ORAL_CAPSULE | Freq: Three times a day (TID) | ORAL | 0 refills | Status: DC | PRN

## 2022-08-29 MED ORDER — DEXAMETHASONE SODIUM PHOSPHATE 10 MG/ML IJ SOLN
10.0000 mg | Freq: Once | INTRAMUSCULAR | Status: AC
  Administered 2022-08-29: 10 mg via INTRAMUSCULAR
  Filled 2022-08-29: qty 1

## 2022-08-29 NOTE — Discharge Instructions (Addendum)
You were evaluated today for pain of the great toe of the left foot. This does seem consistent with gout. I have provided contact information for orthopedics. You may call to schedule a follow up appointment since the pain has persisted with

## 2022-08-29 NOTE — ED Provider Notes (Signed)
[facility] Provider Note   CSN: [id number] Arrival date & time: 08/29/22  1415     History  Chief Complaint  Patient presents with   Foot Pain    [name] is a 57 y.o. male.  Patient presents to the emergency department complaining of pain at the base of the great toe of the left foot.  Over the past month he has been seen for pain of the right foot and the left foot and has had imaging of both.  He has been told that he likely has gout.  The patient has not followed up at this time with orthopedics.  He has been seen by his primary care provider who has recommended orthopedic follow-up as well.  She has prescribed gabapentin and recommended follow-up with no improvement in symptoms.  Patient denies fevers, nausea, vomiting, or other systemic symptoms.  He denies any new injury or trauma.  Past medical history significant for substance abuse, polysubstance abuse, chronic pain syndrome, alcohol abuse  HPI     Home Medications Prior to Admission medications   Medication Sig Start Date End Date Taking? Authorizing Provider  atorvastatin (LIPITOR) 20 MG tablet Take 1 tablet (20 mg total) by mouth daily. 09/16/21   Gildardo Pounds, NP  gabapentin (NEURONTIN) 600 MG tablet Take 1 tablet (600 mg total) by mouth 3 (three) times daily. 08/14/22   Gildardo Pounds, NP  methocarbamol (ROBAXIN) 500 MG tablet Take 1 tablet (500 mg total) by mouth every 8 (eight) hours as needed for muscle spasms. 08/16/21   Petrucelli, Samantha R, PA-C  ondansetron (ZOFRAN-ODT) 4 MG disintegrating tablet Take 1 tablet (4 mg total) by mouth every 8 (eight) hours as needed for nausea or vomiting. 08/16/21   Petrucelli, Samantha R, PA-C  oxyCODONE-acetaminophen (PERCOCET/ROXICET) 5-325 MG tablet Take 1 tablet by mouth every 6 (six) hours as needed for severe pain. 08/23/22   Sherrill Raring, PA-C  pantoprazole (PROTONIX) 20 MG tablet Take 1 tablet (20 mg total) by mouth  daily. 08/16/21   Petrucelli, Samantha R, PA-C  omeprazole (PRILOSEC) 20 MG capsule Take 1 capsule (20 mg total) by mouth daily. Patient not taking: Reported on 08/03/2019 11/11/18 08/04/19  Petrucelli, Glynda Jaeger, PA-C      Allergies    Tomato    Review of Systems   Review of Systems  Physical Exam Updated Vital Signs BP 128/85 (BP Location: Left Arm)   Pulse 91   Temp 98.5 F (36.9 C) (Oral)   Resp 16   Ht 5\' 7"  (1.702 m)   Wt 68.9 kg   SpO2 95%   BMI 23.81 kg/m  Physical Exam HENT:     Head: Normocephalic and atraumatic.  Eyes:     Pupils: Pupils are equal, round, and reactive to light.  Pulmonary:     Effort: Pulmonary effort is normal. No respiratory distress.  Musculoskeletal:        General: No signs of injury.     Cervical back: Normal range of motion.     Comments: Patient able to move left toe, but does complain of pain with range of motion.  No crepitus, minimal erythema, no open wound  Skin:    General: Skin is dry.     Findings: Erythema present.  Neurological:     Mental Status: He is alert.  Psychiatric:        Speech: Speech normal.        Behavior: Behavior normal.  ED Results / Procedures / Treatments   Labs (all labs ordered are listed, but only abnormal results are displayed) Labs Reviewed - No data to display  EKG None  Radiology No results found.  Procedures Procedures    Medications Ordered in ED Medications - No data to display  ED Course/ Medical Decision Making/ A&P                             Medical Decision Making   Patient presents the emergency room complaining of left-sided great toe.  Intact and mild erythema consistent with gout noted on exam.  Patient has brisk cap, pulses.  Like to see no signs of cellulitis at this time.  He is able to move the toe. The same toe was affected approximately 1 week ago.  He states that a month ago he had similar symptoms in the great toe of the right foot.  Very low clinical  suspicion of a septic joint.  This seems most consistent with gout.  PCP note from earlier in the month seems to be concerned about narcotic usage.  I reviewed the patient's recent lab work and imaging. Inflammation noted within the first metatarsal. Patient's mother passed me a note requesting that I not prescribe "drugs" for the patient. Lab work shows normal creatinine, normal GFR. Based on concerns about previous substance abuse, plan to prescribe naprosyn. I also ordered the patient decadron and naprosyn here in the emergency department.   Orthopedic follow up contact information provided. Return precautions discussed. Stable for discharge  While leaving, patient's mother informed me that patient has addiction issues, and has also been selling previously prescribed narcotics.          Final Clinical Impression(s) / ED Diagnoses Final diagnoses:  Foot pain, left    Rx / DC Orders ED Discharge Orders     None         Ronny Bacon 08/29/22 1606    Davonna Belling, MD 08/30/22 [phone]

## 2022-08-29 NOTE — ED Triage Notes (Signed)
Pt arrived POV from home c/o left foot and heel pain x several days. Pt states it is swollen as well. Pt states he was seen her recently for the same but needs something stronger for the pain.

## 2022-09-11 ENCOUNTER — Telehealth: Payer: Self-pay | Admitting: Nurse Practitioner

## 2022-09-11 NOTE — Telephone Encounter (Signed)
Copied from CRM [id number]. Topic: General - Other >> Sep 11, 2022 10:00 AM Dondra Prader A wrote: Reason for CRM: Harriett Sine pt mom is calling to see if the PCP had signed the pt paper work for [employer] so everything can be turned over to him that she dropped off two weeks ago. Per Harriett Sine the place the pt is living at the house is getting ready to be torn down and he is living with no water and not electricity. Pt is also wanting to speak with Erskine Squibb to see about getting the pt a place to stay and she would also like to speak with pt PCP.

## 2022-09-13 NOTE — Progress Notes (Unsigned)
Patient ID: [name], male   DOB: 05/27/1966, 57 y.o.   MRN: [id number]  After ED visit 3/27 and again on 4/02  From 08/29/2022 note: [name] is a 57 y.o. male.  Patient presents to the emergency department complaining of pain at the base of the great toe of the left foot.  Over the past month he has been seen for pain of the right foot and the left foot and has had imaging of both.  He has been told that he likely has gout.  The patient has not followed up at this time with orthopedics.  He has been seen by his primary care provider who has recommended orthopedic follow-up as well.  She has prescribed gabapentin and recommended follow-up with no improvement in symptoms.  Patient denies fevers, nausea, vomiting, or other systemic symptoms.  He denies any new injury or trauma.  Past medical history significant for substance abuse, polysubstance abuse, chronic pain syndrome, alcohol abuse   Patient presents the emergency room complaining of left-sided great toe.  Intact and mild erythema consistent with gout noted on exam.  Patient has brisk cap, pulses.  Like to see no signs of cellulitis at this time.  He is able to move the toe. The same toe was affected approximately 1 week ago.  He states that a month ago he had similar symptoms in the great toe of the right foot.  Very low clinical suspicion of a septic joint.  This seems most consistent with gout.  PCP note from earlier in the month seems to be concerned about narcotic usage.   I reviewed the patient's recent lab work and imaging. Inflammation noted within the first metatarsal. Patient's mother passed me a note requesting that I not prescribe "drugs" for the patient. Lab work shows normal creatinine, normal GFR. Based on concerns about previous substance abuse, plan to prescribe naprosyn. I also ordered the patient decadron and naprosyn here in the emergency department.   Orthopedic follow up contact information provided. Return  precautions discussed. Stable for discharge

## 2022-09-14 ENCOUNTER — Other Ambulatory Visit: Payer: Self-pay

## 2022-09-14 ENCOUNTER — Encounter: Payer: Self-pay | Admitting: Physician Assistant

## 2022-09-14 ENCOUNTER — Ambulatory Visit: Payer: Medicaid Other | Attending: Physician Assistant | Admitting: Physician Assistant

## 2022-09-14 ENCOUNTER — Telehealth: Payer: Self-pay

## 2022-09-14 VITALS — BP 127/88 | HR 79 | Temp 98.3°F | Ht 67.0 in | Wt 150.0 lb

## 2022-09-14 DIAGNOSIS — Z09 Encounter for follow-up examination after completed treatment for conditions other than malignant neoplasm: Secondary | ICD-10-CM

## 2022-09-14 DIAGNOSIS — M2550 Pain in unspecified joint: Secondary | ICD-10-CM | POA: Diagnosis not present

## 2022-09-14 DIAGNOSIS — M79674 Pain in right toe(s): Secondary | ICD-10-CM

## 2022-09-14 MED ORDER — INDOMETHACIN 25 MG PO CAPS
25.0000 mg | ORAL_CAPSULE | Freq: Three times a day (TID) | ORAL | 1 refills | Status: DC | PRN
Start: 2022-09-14 — End: 2023-06-06
  Filled 2022-09-14: qty 30, 10d supply, fill #0

## 2022-09-14 MED ORDER — INDOMETHACIN 25 MG PO CAPS
25.0000 mg | ORAL_CAPSULE | Freq: Three times a day (TID) | ORAL | 1 refills | Status: DC | PRN
Start: 2022-09-14 — End: 2022-09-14

## 2022-09-14 NOTE — Telephone Encounter (Signed)
Patient on today for ED follow up. Patient mother Dorene Grebe in attendance with patient. Mrs. Hoag completed DPR with the agree of patient . Mrs. Hoag voiced to me that the patient is only attending this visit because he wants prescription pain meds.  Mrs. Hoag voiced that patient has been selling his prescribed pain medication. Patient was in the ED on 08/29/2022 this information was also noted in that encounter.

## 2022-09-14 NOTE — Patient Instructions (Signed)
Drink 80-100 ounces water daily  Gout  Gout is painful swelling of your joints. Gout is a type of arthritis. It is caused by having too much uric acid in your body. Uric acid is a chemical that is made when your body breaks down substances called purines. If your body has too much uric acid, sharp crystals can form and build up in your joints. This causes pain and swelling. Gout attacks can happen quickly and be very painful (acute gout). Over time, the attacks can affect more joints and happen more often (chronic gout). What are the causes? Gout is caused by too much uric acid in your blood. This can happen because: Your kidneys do not remove enough uric acid from your blood. Your body makes too much uric acid. You eat too many foods that are high in purines. These foods include organ meats, some seafood, and beer. Trauma or stress can bring on an attack. What increases the risk? Having a family history of gout. Being male and middle-aged. Being male and having gone through menopause. Having an organ transplant. Taking certain medicines. Having certain conditions, such as: Being very overweight (obese). Lead poisoning. Kidney disease. A skin condition called psoriasis. Other risks include: Losing weight too quickly. Not having enough water in the body (being dehydrated). Drinking alcohol, especially beer. Drinking beverages that are sweetened with a type of sugar called fructose. What are the signs or symptoms? An attack of acute gout often starts at night and usually happens in just one joint. The most common place is the big toe. Other joints that may be affected include joints of the feet, ankle, knee, fingers, wrist, or elbow. Symptoms may include: Very bad pain. Warmth. Swelling. Stiffness. Tenderness. The affected joint may be very painful to touch. Shiny, red, or purple skin. Chills and fever. Chronic gout may cause symptoms more often. More joints may be involved. You  may also have white or yellow lumps (tophi) on your hands or feet or in other areas near your joints. How is this treated? Treatment for an acute attack may include medicines for pain and swelling, such as: NSAIDs, such as ibuprofen. Steroids taken by mouth or injected into a joint. Colchicine. This can be given by mouth or through an IV tube. Treatment to prevent future attacks may include: Taking small doses of NSAIDs or colchicine daily. Using a medicine that reduces uric acid levels in your blood, such as allopurinol. Making changes to your diet. You may need to see a food expert (dietitian) about what to eat and drink to prevent gout. Follow these instructions at home: During a gout attack  If told, put ice on the painful area. To do this: Put ice in a plastic bag. Place a towel between your skin and the bag. Leave the ice on for 20 minutes, 2-3 times a day. Take off the ice if your skin turns bright red. This is very important. If you cannot feel pain, heat, or cold, you have a greater risk of damage to the area. Raise the painful joint above the level of your heart as often as you can. Rest the joint as much as possible. If the joint is in your leg, you may be given crutches. Follow instructions from your doctor about what you cannot eat or drink. Avoiding future gout attacks Eat a low-purine diet. Avoid foods and drinks such as: Liver. Kidney. Anchovies. Asparagus. Herring. Mushrooms. Mussels. Beer. Stay at a healthy weight. If you want to lose weight,   talk with your doctor. Do not lose weight too fast. Start or continue an exercise plan as told by your doctor. Eating and drinking Avoid drinks sweetened by fructose. Drink enough fluids to keep your pee (urine) pale yellow. If you drink alcohol: Limit how much you have to: 0-1 drink a day for women who are not pregnant. 0-2 drinks a day for men. Know how much alcohol is in a drink. In the [place] one drink equals one 12  oz bottle of beer (355 mL), one 5 oz glass of wine (148 mL), or one 1 oz glass of hard liquor (44 mL). General instructions Take over-the-counter and prescription medicines only as told by your doctor. Ask your doctor if you should avoid driving or using machines while you are taking your medicine. Return to your normal activities when your doctor says that it is safe. Keep all follow-up visits. Where to find more information National Institutes of Health: www.niams.nih.gov Contact a doctor if: You have another gout attack. You still have symptoms of a gout attack after 10 days of treatment. You have problems (side effects) because of your medicines. You have chills or a fever. You have burning pain when you pee (urinate). You have pain in your lower back or belly. Get help right away if: You have very bad pain. Your pain cannot be controlled. You cannot pee. Summary Gout is painful swelling of the joints. The most common site of pain is the big toe, but it can affect other joints. Medicines and avoiding some foods can help to prevent and treat gout attacks. This information is not intended to replace advice given to you by your health care provider. Make sure you discuss any questions you have with your health care provider. Document Revised: 02/16/2021 Document Reviewed: 02/16/2021 Elsevier Patient Education  2023 [employer]  

## 2022-09-15 NOTE — Telephone Encounter (Signed)
Noted  

## 2022-09-20 ENCOUNTER — Ambulatory Visit: Payer: Medicaid Other | Admitting: Nurse Practitioner

## 2022-09-21 ENCOUNTER — Ambulatory Visit: Payer: Medicaid Other | Admitting: Orthopedic Surgery

## 2022-09-25 ENCOUNTER — Encounter: Payer: Self-pay | Admitting: Orthopedic Surgery

## 2022-09-25 ENCOUNTER — Ambulatory Visit (INDEPENDENT_AMBULATORY_CARE_PROVIDER_SITE_OTHER): Payer: Medicaid Other | Admitting: Orthopedic Surgery

## 2022-09-25 DIAGNOSIS — B351 Tinea unguium: Secondary | ICD-10-CM

## 2022-09-25 DIAGNOSIS — M79674 Pain in right toe(s): Secondary | ICD-10-CM | POA: Diagnosis not present

## 2022-09-25 DIAGNOSIS — M79675 Pain in left toe(s): Secondary | ICD-10-CM | POA: Diagnosis not present
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
## 2022-09-26 LAB — URIC ACID: Uric Acid, Serum: 9.1 mg/dL — ABNORMAL HIGH (ref 4.0–8.0)

## 2022-10-02 ENCOUNTER — Other Ambulatory Visit: Payer: Self-pay | Admitting: Orthopedic Surgery

## 2022-10-02 ENCOUNTER — Telehealth: Payer: Self-pay | Admitting: Orthopedic Surgery

## 2022-10-02 MED ORDER — ALLOPURINOL 100 MG PO TABS: 100.0000 mg | ORAL_TABLET | Freq: Two times a day (BID) | ORAL | 3 refills | Status: DC

## 2022-10-02 MED ORDER — COLCHICINE 0.6 MG PO CAPS: 0.6000 mg | ORAL_CAPSULE | Freq: Two times a day (BID) | ORAL | 3 refills | Status: DC | PRN

## 2022-10-02 NOTE — Telephone Encounter (Signed)
Patrick Mustard, MD  Gretta Arab C Patient with a uric acid of 9 consistent with gout.  Prescription sent for gout treatment.  Follow-up in 4 weeks.

## 2022-10-02 NOTE — Telephone Encounter (Signed)
SW pt's mother, she is informed and will pick up medications today for him to start taking.

## 2022-11-20 ENCOUNTER — Ambulatory Visit: Payer: Medicaid Other | Admitting: Nurse Practitioner

## 2023-05-09 ENCOUNTER — Ambulatory Visit: Payer: Self-pay

## 2023-05-09 NOTE — Telephone Encounter (Signed)
Noted  

## 2023-05-09 NOTE — Telephone Encounter (Signed)
  Chief Complaint: abdominal pain Symptoms: abdominal pain d/t hernia, intermittent, pt groans and screams at times per mother, has diarrhea and poor apptite Frequency: several days he has been with her but ongoing issue  Pertinent Negatives: Patient denies vomiting  Disposition: [] ED /[] Urgent Care (no appt availability in office) / [] Appointment(In office/virtual)/ []  [facility]/ [] Home Care/ [] Refused Recommended Disposition /[x] [facility]/ []  Follow-up with PCP Additional Notes: pt's mother states she would like pt seen. He has been staying with her since it has been raining and she is concerned about abdominal pain. No appts until 05/18/23. She states that day wouldn't work for her and prefers for him to be seen now. Offered [facility] and provided location details. She states she will try to get him to go today.   Reason for Disposition  [1] MODERATE pain (e.g., interferes with normal activities) AND [2] pain comes and goes (cramps) AND [3] present > 24 hours  (Exception: Pain with Vomiting or Diarrhea - see that Guideline.)  Answer Assessment - Initial Assessment Questions 1. LOCATION: "Where does it hurt?"      Abdominal  3. ONSET: "When did the pain begin?" (Minutes, hours or days ago)      Several days ongoing issue 5. PATTERN "Does the pain come and go, or is it constant?"    - If it comes and goes: "How long does it last?" "Do you have pain now?"     (Note: Comes and goes means the pain is intermittent. It goes away completely between bouts.)    - If constant: "Is it getting better, staying the same, or getting worse?"      (Note: Constant means the pain never goes away completely; most serious pain is constant and gets worse.)      Comes and goes  6. SEVERITY: "How bad is the pain?"  (e.g., Scale 1-10; mild, moderate, or severe)    - MILD (1-3): Doesn't interfere with normal activities, abdomen soft and not tender to touch.     - MODERATE (4-7): Interferes with  normal activities or awakens from sleep, abdomen tender to touch.     - SEVERE (8-10): Excruciating pain, doubled over, unable to do any normal activities.       Mother states pt has been screaming and groaning d/t pain  7. RECURRENT SYMPTOM: "Have you ever had this type of stomach pain before?" If Yes, ask: "When was the last time?" and "What happened that time?"      yes 8. CAUSE: "What do you think is causing the stomach pain?"     Hernia pain  9. RELIEVING/AGGRAVATING FACTORS: "What makes it better or worse?" (e.g., antacids, bending or twisting motion, bowel movement)     Eating  10. OTHER SYMPTOMS: "Do you have any other symptoms?" (e.g., back pain, diarrhea, fever, urination pain, vomiting)       Diarrhea, loss of appetite  Protocols used: Abdominal Pain - Male-A-AH

## 2023-06-06 ENCOUNTER — Emergency Department (HOSPITAL_COMMUNITY)
Admission: EM | Admit: 2023-06-06 | Discharge: 2023-06-06 | Disposition: A | Discharge status: Home / Self Care | Payer: Medicaid Other

## 2023-06-06 DIAGNOSIS — M79671 Pain in right foot: Secondary | ICD-10-CM | POA: Insufficient documentation

## 2023-06-06 DIAGNOSIS — M79672 Pain in left foot: Secondary | ICD-10-CM | POA: Diagnosis not present

## 2023-06-06 DIAGNOSIS — M2550 Pain in unspecified joint: Secondary | ICD-10-CM

## 2023-06-06 DIAGNOSIS — M79674 Pain in right toe(s): Secondary | ICD-10-CM

## 2023-06-06 MED ORDER — NAPROXEN 250 MG PO TABS
500.0000 mg | ORAL_TABLET | Freq: Once | ORAL | Status: AC
  Administered 2023-06-06: 500 mg via ORAL
  Filled 2023-06-06: qty 2

## 2023-06-06 MED ORDER — DEXAMETHASONE SODIUM PHOSPHATE 10 MG/ML IJ SOLN
10.0000 mg | Freq: Once | INTRAMUSCULAR | Status: AC
  Administered 2023-06-06: 10 mg via INTRAMUSCULAR
  Filled 2023-06-06: qty 1

## 2023-06-06 MED ORDER — INDOMETHACIN 25 MG PO CAPS
25.0000 mg | ORAL_CAPSULE | Freq: Three times a day (TID) | ORAL | 1 refills | Status: AC | PRN
Start: 2023-06-06 — End: ?

## 2023-06-06 NOTE — ED Provider Notes (Signed)
 [facility] Provider Note   CSN: [id number] Arrival date & time: 06/06/23  1313     History  Chief Complaint  Patient presents with   Foot Pain    [name] is a 58 y.o. male.  The history is provided by the patient, a parent and medical records. No language interpreter was used.  Foot Pain   58 yo M pt with a Hx of gout presents to the ED for pain in his feet. Patient states the pain began in both of his feet 3 days ago and has continued to get worse. He currently rates the pain 10/10 and has not taken any mediation for it. He reports that the pain is worse in his toes, making it difficult to move them. He also reports the pain makes it difficult for him to walk. In addition to the pain the patient reports swelling in both of his feet. The patient reports a diet that consists of mac n cheese, tomato soup, burgers, chicken, and consumes over 3 beers a day. The patient believes his diet may have contributed to the pain he is currently experiencing. The patient is not on any medications. The patient is accompanied by his mom at bedside who mentions that he has a hernia. Patient denies experiencing any abdominal pain and has no other complaints besides his feet.     Home Medications Prior to Admission medications   Medication Sig Start Date End Date Taking? Authorizing Provider  allopurinol  (ZYLOPRIM ) 100 MG tablet Take 1 tablet (100 mg total) by mouth 2 (two) times daily. 10/02/22   Harden Jerona GAILS, MD  atorvastatin  (LIPITOR) 20 MG tablet Take 1 tablet (20 mg total) by mouth daily. 09/16/21   Fleming, [name] W, NP  Colchicine  0.6 MG CAPS Take 1 capsule (0.6 mg total) by mouth 2 (two) times daily as needed. 10/02/22   Harden Jerona GAILS, MD  gabapentin  (NEURONTIN ) 600 MG tablet Take 1 tablet (600 mg total) by mouth 3 (three) times daily. 08/14/22   Fleming, [name] W, NP  indomethacin  (INDOCIN ) 25 MG capsule Take 1 capsule (25 mg total) by mouth 3  (three) times daily as needed for gout flare 09/14/22   Danton Jon HERO, PA-C  methocarbamol  (ROBAXIN ) 500 MG tablet Take 1 tablet (500 mg total) by mouth every 8 (eight) hours as needed for muscle spasms. 08/16/21   Petrucelli, Samantha R, PA-C  ondansetron  (ZOFRAN -ODT) 4 MG disintegrating tablet Take 1 tablet (4 mg total) by mouth every 8 (eight) hours as needed for nausea or vomiting. 08/16/21   Petrucelli, Samantha R, PA-C  oxyCODONE -acetaminophen  (PERCOCET/ROXICET) 5-325 MG tablet Take 1 tablet by mouth every 6 (six) hours as needed for severe pain. Patient not taking: Reported on 09/14/2022 08/23/22   Emelia Sluder, PA-C  pantoprazole  (PROTONIX ) 20 MG tablet Take 1 tablet (20 mg total) by mouth daily. 08/16/21   Petrucelli, Samantha R, PA-C  omeprazole  (PRILOSEC) 20 MG capsule Take 1 capsule (20 mg total) by mouth daily. Patient not taking: Reported on 08/03/2019 11/11/18 08/04/19  Petrucelli, Lucie SAUNDERS, PA-C      Allergies    Tomato    Review of Systems   Review of Systems  All other systems reviewed and are negative.   Physical Exam Updated Vital Signs BP (!) 154/97   Pulse 67   Temp 97.6 F (36.4 C)   Resp 16   SpO2 100%  Physical Exam Vitals and nursing note reviewed.  Constitutional:  General: He is not in acute distress.    Appearance: He is well-developed.  HENT:     Head: Atraumatic.  Eyes:     Conjunctiva/sclera: Conjunctivae normal.  Abdominal:     Comments: In his abdomen patient has an incisional hernia that is nontender to palpation.  Musculoskeletal:        General: Tenderness (Tenderness to palpations of the toes on both feet without any significant erythema edema or warmth noted.  Brisk cap refill pedal pulse palpable.) present.     Cervical back: Neck supple.  Skin:    Findings: No rash.  Neurological:     Mental Status: He is alert.     ED Results / Procedures / Treatments   Labs (all labs ordered are listed, but only abnormal results are  displayed) Labs Reviewed - No data to display  EKG None  Radiology No results found.  Procedures Procedures    Medications Ordered in ED Medications  dexamethasone  (DECADRON ) injection 10 mg (has no administration in time range)  naproxen  (NAPROSYN ) tablet 500 mg (has no administration in time range)    ED Course/ Medical Decision Making/ A&P                                 Medical Decision Making  BP (!) 137/99 (BP Location: Left Arm)   Pulse 82   Temp 98.7 F (37.1 C) (Oral)   Resp 18   SpO2 97%   8:38 PM  58 yo M pt with a Hx of gout presents to the ED for pain in his feet. Patient states the pain began in both of his feet 3 days ago and has continued to get worse. He currently rates the pain 10/10 and has not taken any mediation for it. He reports that the pain is worse in his toes, making it difficult to move them. He also reports the pain makes it difficult for him to walk. In addition to the pain the patient reports swelling in both of his feet. The patient reports a diet that consists of mac n cheese, tomato soup, burgers, chicken, and consumes over 3 beers a day. The patient believes his diet may have contributed to the pain he is currently experiencing. The patient is not on any medications. The patient is accompanied by his mom at bedside who mentions that he has a hernia. Patient denies experiencing any abdominal pain and has no other complaints besides his feet.     Exam notable for tenderness to the toes on both feet.  No obvious signs suggest septic joint or cellulitis.  Toes are mildly edematous.  Patient is neurovascular intact.  Due to prior history of gout and recurrent alcohol  use, I recommend alcohol  cessation.  Will prescribe patient indomethacin  for symptom control.  Patient will receive dexamethasone  and naproxen  in the ED.  X-ray not considered as have low suspicion for fracture or dislocation since they involve both feet at the same time.  Doubt  claudication.  As far as his incisional hernia, he does not need any intervention at this time as there are no evidence to suggest incarceration or strangulation.        Final Clinical Impression(s) / ED Diagnoses Final diagnoses:  Pain in toes of both feet    Rx / DC Orders ED Discharge Orders          Ordered    indomethacin  (INDOCIN ) 25 MG capsule  3 times daily PRN        06/06/23 2043              Nivia Colon, PA-C 06/06/23 2044    Ula Prentice SAUNDERS, MD 06/06/23 [phone]

## 2023-06-06 NOTE — ED Provider Triage Note (Signed)
 Emergency Medicine Provider Triage Evaluation Note  DENG KEMLER , a 58 y.o. male  was evaluated in triage.  Pt complains of toe pain on bilateral feet.  States this is consistent with his gout flareup.  States he is looking to get a shot that he previously got..  Review of Systems  Positive: As above Negative: As above  Physical Exam  BP (!) 154/97   Pulse 67   Temp 97.6 F (36.4 C)   Resp 16   SpO2 100%  Gen:   Awake, no distress   Resp:  Normal effort  MSK:   Moves extremities without difficulty  Other:    Medical Decision Making  Medically screening exam initiated at 1:50 PM.  Appropriate orders placed.  Nancyann FORBES Kleine was informed that the remainder of the evaluation will be completed by another provider, this initial triage assessment does not replace that evaluation, and the importance of remaining in the ED until their evaluation is complete.    Hildegard Loge, PA-C 06/06/23 1351

## 2023-06-06 NOTE — ED Triage Notes (Signed)
 Pt c/o bilateral pain in his toes for 2-3 days. Pt has hx of gout and states pain is similar. Not on allopurinol.

## 2023-06-06 NOTE — Discharge Instructions (Signed)
 Please take medication prescribed.  Avoid alcohol use or eating red meat as it may worsen your symptoms.  Follow-up with your primary care doctor for further care.

## 2023-06-06 NOTE — ED Notes (Signed)
 Patient's mother wanted patient to also be seen for his hernia. She is concerned that he is not expressing all of his concerns for today. She is advocating for him today.

## 2023-06-11 ENCOUNTER — Ambulatory Visit: Payer: Self-pay | Admitting: *Deleted

## 2023-06-11 NOTE — Telephone Encounter (Signed)
 Harriett Sine asking Ms. Fleming admit pt. To hospital. Instructed to call 911 to have pt. Transported to ED. Refuses, States "Forget that, I'll use my own expertise and take care of this."

## 2023-06-11 NOTE — Telephone Encounter (Signed)
 Call was disconnected, Harriett Sine called back. A

## 2023-06-11 NOTE — Telephone Encounter (Signed)
  Chief Complaint: patient's mother not with patient now. Pt mother reports patient is in pain and wants PCP to have patient admitted to the hospital due to hernia, vomiting, pain . [place].  Symptoms: pt mother reports pain from hernia has been vomiting and now has to wear a depends and incontinent of bowel. C/o seeing ribs, legs very small. Loss of weight . Did not report how much weight has been lost.  Frequency: 06/06/23 Pertinent Negatives: Patient denies na  Disposition: [x] ED /[] Urgent Care (no appt availability in office) / [] Appointment(In office/virtual)/ []  [facility]/ [] Home Care/ [] Refused Recommended Disposition /[]  Mobile [facility]/ []  Follow-up with PCP Additional Notes:   Recommended patient call 911 if patient is in severe pain or take him back to ED.  Patient's mother crying and wants PCP to admit patient in hospital. Please advise   Summary: weight loss/dehydration   Patient called stated requesting a call back in reference to patients ED visit with weight loss and dehydration. She wants to get him admitted into the hospital as his ribs can be seen due to living in a tent with very little food. Please f/u with mom               Reason for Disposition  Patient sounds very sick or weak to the triager  Answer Assessment - Initial Assessment Questions 1. MAIN CONCERN: What is your main concern today?     Patient's mother not with patient now. Concerned for weight loss , can see patient's ribs, legs small. Vomiting due to hx hernia 2. WEIGHT LOSS: How much weight have you lost?  (e.g., lbs., kgs.)  Over what period of time have you lost this weight?  (e.g., number of days, weeks, months, years)     See ED visit  3. BASELINE WEIGHT: What is your baseline or normal weight? (e.g., How much do you usually weigh?)     na 4. CAUSE: What do you think is causing the weight loss? (e.g., depression, anxiety, medicine side effect,  pain, trouble swallowing, substance or alcohol  use problem, eating disorder)     Multiple issues  5. PRIOR EVALUATION: Have you been evaluated by a doctor for your weight loss? If Yes, ask When was your last visit? What did your doctor (or NP/PA) tell you about the possible cause?     Yes in ED 6. HEART FAILURE TREATMENT: Do you have heart failure? If Yes, ask: Have you taken new or extra water pills (diuretics) recently? (e.g., furosemide; bumetanide). What is your target weight?     na 7. OTHER SYMPTOMS: Do you have any other symptoms? (e.g., anxiety or depression, blood in stool, breathing difficulty, diarrhea, fever, trouble swallowing)     Weight loss , loss control bowel, wears depends. vomiting 8. PREGNANCY: Is there any chance you are pregnant? When was your last menstrual period?     na  Protocols used: Weight Loss - Unintended-A-AH

## 2023-06-11 NOTE — Telephone Encounter (Signed)
Noted agree with NT disposition

## 2023-06-29 ENCOUNTER — Encounter: Payer: Self-pay | Admitting: Internal Medicine

## 2023-06-29 ENCOUNTER — Ambulatory Visit: Payer: Medicaid Other | Attending: Internal Medicine | Admitting: Internal Medicine

## 2023-06-29 ENCOUNTER — Telehealth: Payer: Self-pay | Admitting: Nurse Practitioner

## 2023-06-29 VITALS — BP 118/81 | HR 87 | Temp 98.2°F | Ht 67.0 in | Wt 143.0 lb

## 2023-06-29 DIAGNOSIS — M79671 Pain in right foot: Secondary | ICD-10-CM

## 2023-06-29 DIAGNOSIS — M79672 Pain in left foot: Secondary | ICD-10-CM

## 2023-06-29 MED ORDER — COLCHICINE 0.6 MG PO CAPS
0.6000 mg | ORAL_CAPSULE | Freq: Every day | ORAL | 0 refills | Status: AC
Start: 2023-06-29 — End: ?
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
## 2023-06-29 NOTE — Patient Instructions (Signed)
I have referred you to a podiatrist for the pain on the ball of your feet.  They will call you to schedule the appointment.  I have sent a prescription to your pharmacy for your medication called Colchicine for gout.

## 2023-06-29 NOTE — Telephone Encounter (Signed)
Patient came in for an appointment and was accompanied by his mother, she was requesting to speak with his primary care doctor but I informed her they are seeing patients at the moment. Patients mother is requesting a call back.

## 2023-07-03 NOTE — Telephone Encounter (Signed)
Mother has already spoken with the provider during that visit.

## 2023-07-06 ENCOUNTER — Ambulatory Visit: Payer: Medicaid Other | Admitting: Nurse Practitioner

## 2023-07-11 ENCOUNTER — Ambulatory Visit (INDEPENDENT_AMBULATORY_CARE_PROVIDER_SITE_OTHER): Payer: Medicaid Other | Admitting: Podiatry

## 2023-07-11 ENCOUNTER — Encounter: Payer: Self-pay | Admitting: Podiatry

## 2023-07-11 ENCOUNTER — Ambulatory Visit (INDEPENDENT_AMBULATORY_CARE_PROVIDER_SITE_OTHER): Payer: Medicaid Other

## 2023-07-11 DIAGNOSIS — M7752 Other enthesopathy of left foot: Secondary | ICD-10-CM

## 2023-07-11 DIAGNOSIS — M7751 Other enthesopathy of right foot: Secondary | ICD-10-CM | POA: Diagnosis not present

## 2023-07-11 DIAGNOSIS — M1A0790 Idiopathic chronic gout, unspecified ankle and foot, without tophus (tophi): Secondary | ICD-10-CM

## 2023-07-11 DIAGNOSIS — M722 Plantar fascial fibromatosis: Secondary | ICD-10-CM | POA: Diagnosis not present

## 2023-07-11 DIAGNOSIS — M778 Other enthesopathies, not elsewhere classified: Secondary | ICD-10-CM

## 2023-07-11 MED ORDER — BETAMETHASONE SOD PHOS & ACET 6 (3-3) MG/ML IJ SUSP
3.0000 mg | Freq: Once | INTRAMUSCULAR | Status: AC
Start: 2023-07-11 — End: 2023-07-11
  Administered 2023-07-11: 3 mg via INTRA_ARTICULAR

## 2023-07-11 MED ORDER — METHYLPREDNISOLONE 4 MG PO TBPK: ORAL_TABLET | ORAL | 0 refills | Status: DC
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
## 2023-07-12 ENCOUNTER — Emergency Department (HOSPITAL_COMMUNITY): Payer: Medicaid Other

## 2023-07-12 ENCOUNTER — Encounter (HOSPITAL_COMMUNITY): Payer: Self-pay

## 2023-07-12 ENCOUNTER — Other Ambulatory Visit: Payer: Self-pay

## 2023-07-12 ENCOUNTER — Emergency Department (HOSPITAL_COMMUNITY)
Admission: EM | Admit: 2023-07-12 | Discharge: 2023-07-12 | Disposition: A | Discharge status: Home / Self Care | Payer: Medicaid Other | Attending: Emergency Medicine | Admitting: Emergency Medicine

## 2023-07-12 DIAGNOSIS — K209 Esophagitis, unspecified without bleeding: Secondary | ICD-10-CM | POA: Diagnosis not present

## 2023-07-12 DIAGNOSIS — N3091 Cystitis, unspecified with hematuria: Secondary | ICD-10-CM | POA: Diagnosis not present

## 2023-07-12 DIAGNOSIS — R112 Nausea with vomiting, unspecified: Secondary | ICD-10-CM | POA: Diagnosis not present

## 2023-07-12 DIAGNOSIS — K5730 Diverticulosis of large intestine without perforation or abscess without bleeding: Secondary | ICD-10-CM | POA: Diagnosis not present

## 2023-07-12 DIAGNOSIS — K439 Ventral hernia without obstruction or gangrene: Secondary | ICD-10-CM | POA: Diagnosis not present

## 2023-07-12 DIAGNOSIS — N3001 Acute cystitis with hematuria: Secondary | ICD-10-CM

## 2023-07-12 DIAGNOSIS — M109 Gout, unspecified: Secondary | ICD-10-CM | POA: Diagnosis not present

## 2023-07-12 DIAGNOSIS — R109 Unspecified abdominal pain: Secondary | ICD-10-CM | POA: Diagnosis not present

## 2023-07-12 LAB — CBC
HCT: 41.2 % (ref 39.0–52.0)
Hemoglobin: 13.6 g/dL (ref 13.0–17.0)
MCH: 29.1 pg (ref 26.0–34.0)
MCHC: 33 g/dL (ref 30.0–36.0)
MCV: 88.2 fL (ref 80.0–100.0)
Platelets: 346 10*3/uL (ref 150–400)
RBC: 4.67 MIL/uL (ref 4.22–5.81)
RDW: 13.6 % (ref 11.5–15.5)
WBC: 9.8 10*3/uL (ref 4.0–10.5)
nRBC: 0 % (ref 0.0–0.2)

## 2023-07-12 LAB — URINALYSIS, ROUTINE W REFLEX MICROSCOPIC
Bilirubin Urine: NEGATIVE
Glucose, UA: 50 mg/dL — AB
Hgb urine dipstick: NEGATIVE
Ketones, ur: NEGATIVE mg/dL
Nitrite: NEGATIVE
Protein, ur: NEGATIVE mg/dL
Specific Gravity, Urine: 1.024 (ref 1.005–1.030)
WBC, UA: >50 WBC/hpf (ref 0–5)
pH: 6 (ref 5.0–8.0)

## 2023-07-12 LAB — COMPREHENSIVE METABOLIC PANEL
ALT: 15 U/L (ref 0–44)
AST: 20 U/L (ref 15–41)
Albumin: 3.4 g/dL — ABNORMAL LOW (ref 3.5–5.0)
Alkaline Phosphatase: 38 U/L (ref 38–126)
Anion gap: 11 (ref 5–15)
BUN: 16 mg/dL (ref 6–20)
CO2: 25 mmol/L (ref 22–32)
Calcium: 9.3 mg/dL (ref 8.9–10.3)
Chloride: 102 mmol/L (ref 98–111)
Creatinine, Ser: 1.06 mg/dL (ref 0.61–1.24)
GFR, Estimated: >60 mL/min (ref >60)
Glucose, Bld: 130 mg/dL — ABNORMAL HIGH (ref 70–99)
Potassium: 3.8 mmol/L (ref 3.5–5.1)
Sodium: 138 mmol/L (ref 135–145)
Total Bilirubin: 0.6 mg/dL (ref 0.0–1.2)
Total Protein: 6.6 g/dL (ref 6.5–8.1)

## 2023-07-12 LAB — LIPASE, BLOOD: Lipase: 33 U/L (ref 11–51)

## 2023-07-12 MED ORDER — NITROFURANTOIN MONOHYD MACRO 100 MG PO CAPS: 100.0000 mg | ORAL_CAPSULE | Freq: Two times a day (BID) | ORAL | 0 refills | Status: DC

## 2023-07-12 MED ORDER — PREDNISONE 20 MG PO TABS: 40.0000 mg | ORAL_TABLET | Freq: Every day | ORAL | 0 refills | Status: DC

## 2023-07-12 MED ORDER — HYDROCODONE-ACETAMINOPHEN 5-325 MG PO TABS
1.0000 | ORAL_TABLET | Freq: Once | ORAL | Status: AC
  Administered 2023-07-12: 1 via ORAL
  Filled 2023-07-12: qty 1

## 2023-07-12 MED ORDER — FAMOTIDINE 20 MG PO TABS: 20.0000 mg | ORAL_TABLET | Freq: Two times a day (BID) | ORAL | 0 refills | Status: DC

## 2023-07-12 MED ORDER — IOHEXOL 350 MG/ML SOLN
75.0000 mL | Freq: Once | INTRAVENOUS | Status: AC | PRN
  Administered 2023-07-12: 75 mL via INTRAVENOUS

## 2023-07-12 MED ORDER — NITROFURANTOIN MONOHYD MACRO 100 MG PO CAPS
100.0000 mg | ORAL_CAPSULE | Freq: Once | ORAL | Status: AC
  Administered 2023-07-12: 100 mg via ORAL
  Filled 2023-07-12 (×2): qty 1

## 2023-07-12 MED ORDER — ONDANSETRON HCL 4 MG/2ML IJ SOLN
4.0000 mg | Freq: Once | INTRAMUSCULAR | Status: AC
  Administered 2023-07-12: 4 mg via INTRAVENOUS
  Filled 2023-07-12: qty 2

## 2023-07-12 MED ORDER — MORPHINE SULFATE (PF) 4 MG/ML IV SOLN
4.0000 mg | Freq: Once | INTRAVENOUS | Status: AC
  Administered 2023-07-12: 4 mg via INTRAVENOUS
  Filled 2023-07-12: qty 1

## 2023-07-12 MED ORDER — ONDANSETRON 4 MG PO TBDP: 4.0000 mg | ORAL_TABLET | Freq: Three times a day (TID) | ORAL | 0 refills | Status: DC | PRN

## 2023-07-12 MED ORDER — LACTATED RINGERS IV BOLUS
1000.0000 mL | Freq: Once | INTRAVENOUS | Status: AC
  Administered 2023-07-12: 1000 mL via INTRAVENOUS

## 2023-07-12 NOTE — Discharge Instructions (Addendum)
The blood work and the CAT scan today look good except for some irritation in your esophagus and stomach.  We will start you on an antacid and also on prednisone to help with the gout.  Stop taking the indomethacin and colchicine.  Also a referral was given to [facility] surgery and you can call them about your hernia.  Avoid any alcohol or spicy foods.

## 2023-07-12 NOTE — ED Triage Notes (Signed)
Pt arrived via POV c/o abd pain 10/10, multiple emesis episodes. Pt states that he is not able to keep anything down.

## 2023-07-12 NOTE — ED Notes (Signed)
Mother at bedside pulled nurse when pt went to CT scanner. She revealed he is addicted to pain pills, sells all the prescriptions he gets. Asked that we not give him anymore and he comes to ED so much bc he is narcotic seeking. She wants help for her son. He [place] even though she can provide home for him so he can use and drink alcohol as he needs.
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
## 2023-07-13 LAB — URINE CULTURE: Culture: NO GROWTH

## 2023-07-15 ENCOUNTER — Encounter (HOSPITAL_COMMUNITY): Payer: Self-pay | Admitting: *Deleted

## 2023-07-15 ENCOUNTER — Emergency Department (HOSPITAL_COMMUNITY)
Admission: EM | Admit: 2023-07-15 | Discharge: 2023-07-15 | Disposition: A | Discharge status: Home / Self Care | Payer: Medicaid Other | Attending: Emergency Medicine | Admitting: Emergency Medicine

## 2023-07-15 ENCOUNTER — Other Ambulatory Visit (HOSPITAL_COMMUNITY): Payer: Self-pay

## 2023-07-15 ENCOUNTER — Other Ambulatory Visit: Payer: Self-pay

## 2023-07-15 DIAGNOSIS — K439 Ventral hernia without obstruction or gangrene: Secondary | ICD-10-CM | POA: Insufficient documentation

## 2023-07-15 DIAGNOSIS — R112 Nausea with vomiting, unspecified: Secondary | ICD-10-CM | POA: Diagnosis not present

## 2023-07-15 LAB — COMPREHENSIVE METABOLIC PANEL
ALT: 12 U/L (ref 0–44)
AST: 13 U/L — ABNORMAL LOW (ref 15–41)
Albumin: 3.4 g/dL — ABNORMAL LOW (ref 3.5–5.0)
Alkaline Phosphatase: 34 U/L — ABNORMAL LOW (ref 38–126)
Anion gap: 7 (ref 5–15)
BUN: 18 mg/dL (ref 6–20)
CO2: 26 mmol/L (ref 22–32)
Calcium: 8.7 mg/dL — ABNORMAL LOW (ref 8.9–10.3)
Chloride: 104 mmol/L (ref 98–111)
Creatinine, Ser: 1.16 mg/dL (ref 0.61–1.24)
GFR, Estimated: >60 mL/min (ref >60)
Glucose, Bld: 97 mg/dL (ref 70–99)
Potassium: 3.9 mmol/L (ref 3.5–5.1)
Sodium: 137 mmol/L (ref 135–145)
Total Bilirubin: 0.8 mg/dL (ref 0.0–1.2)
Total Protein: 6.5 g/dL (ref 6.5–8.1)

## 2023-07-15 LAB — CBC
HCT: 42.6 % (ref 39.0–52.0)
Hemoglobin: 13.9 g/dL (ref 13.0–17.0)
MCH: 29 pg (ref 26.0–34.0)
MCHC: 32.6 g/dL (ref 30.0–36.0)
MCV: 88.9 fL (ref 80.0–100.0)
Platelets: 302 10*3/uL (ref 150–400)
RBC: 4.79 MIL/uL (ref 4.22–5.81)
RDW: 13.4 % (ref 11.5–15.5)
WBC: 7.3 10*3/uL (ref 4.0–10.5)
nRBC: 0 % (ref 0.0–0.2)

## 2023-07-15 LAB — RESP PANEL BY RT-PCR (RSV, FLU A&B, COVID)  RVPGX2
Influenza A by PCR: NEGATIVE
Influenza B by PCR: NEGATIVE
Resp Syncytial Virus by PCR: NEGATIVE
SARS Coronavirus 2 by RT PCR: NEGATIVE

## 2023-07-15 LAB — LIPASE, BLOOD: Lipase: 31 U/L (ref 11–51)

## 2023-07-15 MED ORDER — ONDANSETRON HCL 4 MG/2ML IJ SOLN
4.0000 mg | Freq: Once | INTRAMUSCULAR | Status: AC
  Administered 2023-07-15: 4 mg via INTRAVENOUS
  Filled 2023-07-15: qty 2

## 2023-07-15 MED ORDER — CHLORPROMAZINE HCL 25 MG PO TABS
25.0000 mg | ORAL_TABLET | Freq: Three times a day (TID) | ORAL | 0 refills | Status: DC | PRN
  Filled 2023-07-15: qty 30, 10d supply, fill #0

## 2023-07-15 MED ORDER — ONDANSETRON 4 MG PO TBDP
4.0000 mg | ORAL_TABLET | Freq: Three times a day (TID) | ORAL | 0 refills | Status: DC | PRN
  Filled 2023-07-15 – 2023-07-16 (×2): qty 20, 7d supply, fill #0

## 2023-07-15 MED ORDER — LACTATED RINGERS IV BOLUS
1000.0000 mL | Freq: Once | INTRAVENOUS | Status: AC
  Administered 2023-07-15: 1000 mL via INTRAVENOUS

## 2023-07-15 MED ORDER — SODIUM CHLORIDE 0.9 % IV SOLN
12.5000 mg | Freq: Once | INTRAVENOUS | Status: AC
  Administered 2023-07-15: 12.5 mg via INTRAVENOUS
  Filled 2023-07-15: qty 12.5

## 2023-07-15 MED ORDER — CHLORPROMAZINE HCL 25 MG PO TABS
25.0000 mg | ORAL_TABLET | Freq: Once | ORAL | Status: AC
  Administered 2023-07-15: 25 mg via ORAL
  Filled 2023-07-15: qty 1

## 2023-07-15 MED ORDER — HYDROCODONE-ACETAMINOPHEN 5-325 MG PO TABS
1.0000 | ORAL_TABLET | Freq: Once | ORAL | Status: AC
  Administered 2023-07-15: 1 via ORAL
  Filled 2023-07-15: qty 1

## 2023-07-15 MED ORDER — PANTOPRAZOLE SODIUM 20 MG PO TBEC
20.0000 mg | DELAYED_RELEASE_TABLET | Freq: Every day | ORAL | 1 refills | Status: DC
  Filled 2023-07-15: qty 30, 30d supply, fill #0
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
## 2023-07-15 NOTE — ED Triage Notes (Signed)
 BIB mother from home for abd pain, NV w/ blood, hiccups and feel dehydrated, h/o same, relates to ulcers. No relief with meds yesteday and last night. Describes meds as stool softener, and acid reducer. Denies fever, diarrhea, recent ABT or travel. Alert, NAD, calm, interactive, hiccups present.

## 2023-07-15 NOTE — Discharge Instructions (Addendum)
 As discussed, your labs appeared reassuring.  Will send medications to the [facility].  I have sent in a order for social work to contact you regarding getting help with medication in the outpatient setting.  You should receive a call from them to help coordinate your medications in the outpatient setting.

## 2023-07-15 NOTE — ED Provider Notes (Signed)
 [place] EMERGENCY DEPARTMENT AT [facility] Provider Note   CSN: [id number] Arrival date & time: 07/15/23  0932     History  Chief Complaint  Patient presents with   Abdominal Pain    AVIR DERUITER is a 58 y.o. male.   Abdominal Pain   58 year old male presents again to the emergency department with abdominal pain, nausea, vomiting, hiccups.  Patient reports symptoms present for the past 3 days.  Was recent seen in the emergency department 3 days ago at [facility] emergency department.  Had a reassuring labs as well as CT imaging done at that time.  Was sent home with antiemetic, prednisone, reflux medication but patient is homeless and does not have money enough to pay for medications.  States his symptoms returned after leaving the hospital.  Reports pain in middle of his abdomen without radiation.  States that he had a couple episodes of streaky blood in his vomit and denies any gross bloody vomit.  Denies blood thinner use.  Denies any urinary symptoms, change in bowel habits, chest pain, shortness of breath, fever, chills..  Does state that has been with cough and some congestion over the past few days.  Past medical history significant for chronic pancreatitis, alcohol abuse, polysubstance abuse,  Home Medications Prior to Admission medications   Medication Sig Start Date End Date Taking? Authorizing Provider  chlorproMAZINE (THORAZINE) 25 MG tablet Take 1 tablet (25 mg total) by mouth 3 (three) times daily as needed for hiccoughs. 07/15/23  Yes Sherian Maroon A, PA  ondansetron (ZOFRAN-ODT) 4 MG disintegrating tablet Take 1 tablet (4 mg total) by mouth every 8 (eight) hours as needed. 07/15/23  Yes Sherian Maroon A, PA  pantoprazole (PROTONIX) 20 MG tablet Take 1 tablet (20 mg total) by mouth daily. 07/15/23  Yes Sherian Maroon A, PA  allopurinol (ZYLOPRIM) 100 MG tablet Take 1 tablet (100 mg total) by mouth 2 (two) times daily. 10/02/22   Nadara Mustard, MD   atorvastatin (LIPITOR) 20 MG tablet Take 1 tablet (20 mg total) by mouth daily. 09/16/21   Claiborne Rigg, NP  Colchicine 0.6 MG CAPS Take 1 capsule (0.6 mg total) by mouth daily. 06/29/23   Marcine Matar, MD  famotidine (PEPCID) 20 MG tablet Take 1 tablet (20 mg total) by mouth 2 (two) times daily. 07/12/23   Gwyneth Sprout, MD  gabapentin (NEURONTIN) 600 MG tablet Take 1 tablet (600 mg total) by mouth 3 (three) times daily. 08/14/22   Claiborne Rigg, NP  indomethacin (INDOCIN) 25 MG capsule Take 1 capsule (25 mg total) by mouth 3 (three) times daily as needed for gout flare 06/06/23   Fayrene Helper, PA-C  methocarbamol (ROBAXIN) 500 MG tablet Take 1 tablet (500 mg total) by mouth every 8 (eight) hours as needed for muscle spasms. 08/16/21   Petrucelli, Pleas Koch, PA-C  methylPREDNISolone (MEDROL DOSEPAK) 4 MG TBPK tablet 6 day dose pack - take as directed 07/11/23   Felecia Shelling, DPM  nitrofurantoin, macrocrystal-monohydrate, (MACROBID) 100 MG capsule Take 1 capsule (100 mg total) by mouth 2 (two) times daily. 07/12/23   Gwyneth Sprout, MD  oxyCODONE-acetaminophen (PERCOCET/ROXICET) 5-325 MG tablet Take 1 tablet by mouth every 6 (six) hours as needed for severe pain. 08/23/22   Theron Arista, PA-C  predniSONE (DELTASONE) 20 MG tablet Take 2 tablets (40 mg total) by mouth daily. 07/12/23   Gwyneth Sprout, MD  omeprazole (PRILOSEC) 20 MG capsule Take 1 capsule (20 mg total)  by mouth daily. Patient not taking: Reported on 08/03/2019 11/11/18 08/04/19  Petrucelli, Lelon Mast R, PA-C      Allergies    Tomato    Review of Systems   Review of Systems  Gastrointestinal:  Positive for abdominal pain.  All other systems reviewed and are negative.   Physical Exam Updated Vital Signs BP 115/82   Pulse 90   Temp 98 F (36.7 C) (Oral)   Resp 18   Wt 64.9 kg   SpO2 99%   BMI 22.40 kg/m  Physical Exam Vitals and nursing note reviewed.  Constitutional:      General: He is not in acute  distress.    Appearance: He is well-developed.  HENT:     Head: Normocephalic and atraumatic.  Eyes:     Conjunctiva/sclera: Conjunctivae normal.  Cardiovascular:     Rate and Rhythm: Normal rate and regular rhythm.     Heart sounds: No murmur heard. Pulmonary:     Effort: Pulmonary effort is normal. No respiratory distress.     Breath sounds: Normal breath sounds.  Abdominal:     Palpations: Abdomen is soft.     Tenderness: There is abdominal tenderness in the epigastric area and periumbilical area.     Comments: Ventral hernia appreciated.  Musculoskeletal:        General: No swelling.     Cervical back: Neck supple.  Skin:    General: Skin is warm and dry.     Capillary Refill: Capillary refill takes less than 2 seconds.  Neurological:     Mental Status: He is alert.  Psychiatric:        Mood and Affect: Mood normal.     ED Results / Procedures / Treatments   Labs (all labs ordered are listed, but only abnormal results are displayed) Labs Reviewed  COMPREHENSIVE METABOLIC PANEL - Abnormal; Notable for the following components:      Result Value   Calcium 8.7 (*)    Albumin 3.4 (*)    AST 13 (*)    Alkaline Phosphatase 34 (*)    All other components within normal limits  RESP PANEL BY RT-PCR (RSV, FLU A&B, COVID)  RVPGX2  LIPASE, BLOOD  CBC    EKG None  Radiology No results found.  Procedures Procedures    Medications Ordered in ED Medications  ondansetron (ZOFRAN) injection 4 mg (4 mg Intravenous Given 07/15/23 1040)  lactated ringers bolus 1,000 mL (0 mLs Intravenous Stopped 07/15/23 1139)  HYDROcodone-acetaminophen (NORCO/VICODIN) 5-325 MG per tablet 1 tablet (1 tablet Oral Given 07/15/23 1041)  chlorproMAZINE (THORAZINE) tablet 25 mg (25 mg Oral Given 07/15/23 1146)  promethazine (PHENERGAN) 12.5 mg in sodium chloride 0.9 % 50 mL IVPB (0 mg Intravenous Stopped 07/15/23 1259)    ED Course/ Medical Decision Making/ A&P                                  Medical Decision Making Amount and/or Complexity of Data Reviewed Labs: ordered.  Risk Prescription drug management.   This patient presents to the ED for concern of abdominal pain, nausea, vomiting, this involves an extensive number of treatment options, and is a complaint that carries with it a high risk of complications and morbidity.  The differential diagnosis includes viral gastroenteritis, gastritis, PUD/DDD, CBD pathology, cholecystitis, pancreatitis, SBO/LBO, volvulus, diverticulitis, appendicitis, nephrolithiasis, pyelonephritis, cystitis, other   Co morbidities that complicate the patient evaluation  See  HPI   Additional history obtained:  Additional history obtained from EMR External records from outside source obtained and reviewed including hospital records   Lab Tests:  I Ordered, and personally interpreted labs.  The pertinent results include: No doses.  No evidence of anemia.  Placed within range.  No Electra abnormalities.  No transaminitis.  No renal dysfunction.  Lipase within normal limits.  Viral testing negative.   Imaging Studies ordered:  N/a   Cardiac Monitoring: / EKG:  The patient was maintained on a cardiac monitor.  I personally viewed and interpreted the cardiac monitored which showed an underlying rhythm of: sinus rhythm   Consultations Obtained:  N/a   Problem List / ED Course / Critical interventions / Medication management  Nausea, Vomiting I ordered medication including Norco, Zofran, lactated Ringer's, Thorazine, Phenergan   Reevaluation of the patient after these medicines showed that the patient improved I have reviewed the patients home medicines and have made adjustments as needed   Social Determinants of Health:  Polysubstance abuse   Test / Admission - Considered:  Nausea, vomiting Vitals signs within normal range and stable throughout visit. Laboratory studies significant for: See above 58 year old male presents  emergency department with complaints of abdominal pain nausea, vomiting, hiccups.  Symptoms as over the past 3 days or so.  On exam, tenderness epigastric as well as periumbilical area with left-sided ventral hernia appreciated.  Patient with recent CT imaging for same presentation 3 days ago which did show mural thickening distal esophagus, diverticulosis with small fat-containing supraumbilical ventral hernia.  Obtained laboratory studies which are grossly reassuring.  Shared decision made conversation was had with patient regarding try to manage symptoms with medications while the ED given that labs are reassuring and had CT imaging 3 days ago that were reassuring of which he was agreeable.  Treated with antiemetic, fluids with noted improvement subsequent tolerance of p.o.  Patient is homeless and has had difficulty getting medications due to financial strains.  Will consult TOC to get social work on his case to help with medication assistance.  No indication for admission at this time.  Treatment plan discussed at length with patient and he acknowledged understanding was agreeable to said plan.  Patient overall well-appearing, afebrile, tolerating p.o. without difficulty.        Final Clinical Impression(s) / ED Diagnoses Final diagnoses:  Nausea and vomiting, unspecified vomiting type    Rx / DC Orders      Peter Garter, [place] 07/15/23 1944    Gloris Manchester, MD 07/17/23 [phone]

## 2023-07-16 ENCOUNTER — Other Ambulatory Visit (HOSPITAL_COMMUNITY): Payer: Self-pay

## 2023-07-16 LAB — GC/CHLAMYDIA PROBE AMP (~~LOC~~) NOT AT ARMC
Chlamydia: NEGATIVE
Comment: NEGATIVE
Comment: NORMAL
Neisseria Gonorrhea: NEGATIVE

## 2023-07-25 ENCOUNTER — Ambulatory Visit: Payer: Medicaid Other | Admitting: Podiatry

## 2023-07-25 DIAGNOSIS — K432 Incisional hernia without obstruction or gangrene: Secondary | ICD-10-CM | POA: Diagnosis not present

## 2023-07-25 DIAGNOSIS — K2100 Gastro-esophageal reflux disease with esophagitis, without bleeding: Secondary | ICD-10-CM | POA: Diagnosis not present

## 2023-08-03 ENCOUNTER — Other Ambulatory Visit: Payer: Self-pay

## 2023-08-03 ENCOUNTER — Ambulatory Visit: Payer: Self-pay | Admitting: General Surgery

## 2023-08-03 ENCOUNTER — Encounter (HOSPITAL_COMMUNITY): Payer: Self-pay | Admitting: General Surgery

## 2023-08-03 NOTE — Progress Notes (Addendum)
 COVID Vaccine Completed:  Yes  Date of COVID positive in last 90 days:  PCP - Bertram Denver, NP Cardiologist - N/A  Chest x-ray - N/A EKG - N/A Stress Test - N/A ECHO - 12-03-12 Epic Cardiac Cath - N/A Pacemaker/ICD device last checked: Spinal Cord Stimulator:N/A  Bowel Prep - N/A  Sleep Study - N/A CPAP -   Fasting Blood Sugar - N/A Checks Blood Sugar _____ times a day  Last dose of GLP1 agonist-  N/A GLP1 instructions:  Hold 7 days before surgery    Last dose of SGLT-2 inhibitors-  N/A SGLT-2 instructions:  Hold 3 days before surgery    Blood Thinner Instructions:  Last dose:   Time: Aspirin Instructions: Last Dose:  Activity level:  Can go up a flight of stairs and perform activities of daily living without stopping and without symptoms of chest pain or shortness of breath.  Anesthesia review: N/A  Patient denies shortness of breath, fever, cough and chest pain at PAT appointment (completed over the phone)  Patient verbalized understanding of instructions that were given to them at the PAT appointment. Patient was also instructed that they will need to review over the PAT instructions again at home before surgery.

## 2023-08-03 NOTE — Progress Notes (Signed)
 Surgery orders requested via Epic inbox.

## 2023-08-03 NOTE — Progress Notes (Signed)
 Attempted to obtain medical history via telephone, unable to reach at this time. Unable to leave voicemail, mailbox full.  Will attempt to call patient again.

## 2023-08-06 ENCOUNTER — Encounter (HOSPITAL_COMMUNITY): Payer: Self-pay | Admitting: General Surgery

## 2023-08-06 ENCOUNTER — Other Ambulatory Visit: Payer: Self-pay

## 2023-08-06 ENCOUNTER — Observation Stay (HOSPITAL_COMMUNITY)
Admission: RE | Admit: 2023-08-06 | Discharge: 2023-08-08 | Disposition: A | Discharge status: Home / Self Care | Attending: General Surgery | Admitting: General Surgery

## 2023-08-06 DIAGNOSIS — K432 Incisional hernia without obstruction or gangrene: Principal | ICD-10-CM | POA: Insufficient documentation

## 2023-08-06 DIAGNOSIS — K439 Ventral hernia without obstruction or gangrene: Principal | ICD-10-CM

## 2023-08-06 LAB — HIV ANTIBODY (ROUTINE TESTING W REFLEX): HIV Screen 4th Generation wRfx: NONREACTIVE

## 2023-08-06 MED ORDER — DIPHENHYDRAMINE HCL 25 MG PO CAPS: 25.0000 mg | ORAL_CAPSULE | Freq: Four times a day (QID) | ORAL | Status: DC | PRN

## 2023-08-06 MED ORDER — SIMETHICONE 80 MG PO CHEW: 40.0000 mg | CHEWABLE_TABLET | Freq: Four times a day (QID) | ORAL | Status: DC | PRN

## 2023-08-06 MED ORDER — MORPHINE SULFATE (PF) 2 MG/ML IV SOLN
2.0000 mg | INTRAVENOUS | Status: DC | PRN
  Administered 2023-08-06 – 2023-08-07 (×4): 2 mg via INTRAVENOUS
  Filled 2023-08-06 (×4): qty 1

## 2023-08-06 MED ORDER — ACETAMINOPHEN 650 MG RE SUPP: 650.0000 mg | Freq: Four times a day (QID) | RECTAL | Status: DC | PRN

## 2023-08-06 MED ORDER — ONDANSETRON 4 MG PO TBDP: 4.0000 mg | ORAL_TABLET | Freq: Four times a day (QID) | ORAL | Status: DC | PRN

## 2023-08-06 MED ORDER — OXYCODONE HCL 5 MG PO TABS
5.0000 mg | ORAL_TABLET | Freq: Four times a day (QID) | ORAL | Status: DC | PRN
  Administered 2023-08-06 – 2023-08-08 (×3): 5 mg via ORAL
  Filled 2023-08-06 (×3): qty 1

## 2023-08-06 MED ORDER — DIPHENHYDRAMINE HCL 50 MG/ML IJ SOLN: 25.0000 mg | Freq: Four times a day (QID) | INTRAMUSCULAR | Status: DC | PRN

## 2023-08-06 MED ORDER — KCL IN DEXTROSE-NACL 20-5-0.45 MEQ/L-%-% IV SOLN
INTRAVENOUS | Status: AC
  Filled 2023-08-06 (×3): qty 1000

## 2023-08-06 MED ORDER — ACETAMINOPHEN 325 MG PO TABS: 650.0000 mg | ORAL_TABLET | Freq: Four times a day (QID) | ORAL | Status: DC | PRN

## 2023-08-06 MED ORDER — METOPROLOL TARTRATE 5 MG/5ML IV SOLN: 5.0000 mg | Freq: Four times a day (QID) | INTRAVENOUS | Status: DC | PRN

## 2023-08-06 MED ORDER — POLYETHYLENE GLYCOL 3350 17 G PO PACK: 17.0000 g | PACK | Freq: Every day | ORAL | Status: DC | PRN

## 2023-08-06 MED ORDER — ONDANSETRON HCL 4 MG/2ML IJ SOLN
4.0000 mg | Freq: Four times a day (QID) | INTRAMUSCULAR | Status: DC | PRN
  Administered 2023-08-06: 4 mg via INTRAVENOUS
  Filled 2023-08-06: qty 2
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
## 2023-08-06 NOTE — Progress Notes (Signed)
 Since surgery has been cancelled / did not arrive at scheduled appointment10am- Showed up in preop at 1230- Dr. Sheliah Hatch wants pt. To be admitted for surgery tomorrow.  Admissions/ pt placement aware-    1500 bed assignment 1525- took pt. To unit for admission.

## 2023-08-06 NOTE — Plan of Care (Signed)

## 2023-08-07 ENCOUNTER — Observation Stay (HOSPITAL_BASED_OUTPATIENT_CLINIC_OR_DEPARTMENT_OTHER): Payer: Self-pay | Admitting: Certified Registered Nurse Anesthetist

## 2023-08-07 ENCOUNTER — Observation Stay (HOSPITAL_COMMUNITY): Payer: Self-pay | Admitting: Certified Registered Nurse Anesthetist

## 2023-08-07 ENCOUNTER — Other Ambulatory Visit: Payer: Self-pay

## 2023-08-07 ENCOUNTER — Encounter (HOSPITAL_COMMUNITY): Payer: Self-pay | Admitting: General Surgery

## 2023-08-07 ENCOUNTER — Encounter (HOSPITAL_COMMUNITY)
Admission: RE | Disposition: A | Discharge status: Home / Self Care | Payer: Self-pay | Source: Home / Self Care | Attending: General Surgery

## 2023-08-07 DIAGNOSIS — K432 Incisional hernia without obstruction or gangrene: Secondary | ICD-10-CM | POA: Diagnosis not present

## 2023-08-07 HISTORY — PX: INCISIONAL HERNIA REPAIR: SHX193

## 2023-08-07 SURGERY — REPAIR, HERNIA, INCISIONAL
Anesthesia: General

## 2023-08-07 MED ORDER — DEXAMETHASONE SODIUM PHOSPHATE 10 MG/ML IJ SOLN
INTRAMUSCULAR | Status: DC | PRN
  Administered 2023-08-07: 10 mg via INTRAVENOUS

## 2023-08-07 MED ORDER — MEPERIDINE HCL 50 MG/ML IJ SOLN: 6.2500 mg | INTRAMUSCULAR | Status: DC | PRN

## 2023-08-07 MED ORDER — PHENYLEPHRINE HCL (PRESSORS) 10 MG/ML IV SOLN
INTRAVENOUS | Status: AC
  Filled 2023-08-07: qty 1

## 2023-08-07 MED ORDER — PHENYLEPHRINE HCL-NACL 20-0.9 MG/250ML-% IV SOLN
INTRAVENOUS | Status: DC | PRN
  Administered 2023-08-07: 75 ug/min via INTRAVENOUS

## 2023-08-07 MED ORDER — FENTANYL CITRATE (PF) 100 MCG/2ML IJ SOLN
INTRAMUSCULAR | Status: AC
  Filled 2023-08-07: qty 2

## 2023-08-07 MED ORDER — SUGAMMADEX SODIUM 200 MG/2ML IV SOLN
INTRAVENOUS | Status: DC | PRN
  Administered 2023-08-07: 150 mg via INTRAVENOUS

## 2023-08-07 MED ORDER — COLCHICINE 0.6 MG PO CAPS
0.6000 mg | ORAL_CAPSULE | Freq: Every day | ORAL | Status: DC
Start: 2023-08-07 — End: 2023-08-07

## 2023-08-07 MED ORDER — PHENYLEPHRINE 80 MCG/ML (10ML) SYRINGE FOR IV PUSH (FOR BLOOD PRESSURE SUPPORT)
PREFILLED_SYRINGE | INTRAVENOUS | Status: DC | PRN
  Administered 2023-08-07: 160 ug via INTRAVENOUS
  Administered 2023-08-07 (×2): 80 ug via INTRAVENOUS
  Administered 2023-08-07: 160 ug via INTRAVENOUS
  Administered 2023-08-07: 80 ug via INTRAVENOUS

## 2023-08-07 MED ORDER — LACTATED RINGERS IV SOLN
INTRAVENOUS | Status: DC | PRN
Start: 2023-08-07 — End: 2023-08-07

## 2023-08-07 MED ORDER — MIDAZOLAM HCL 2 MG/2ML IJ SOLN
INTRAMUSCULAR | Status: DC | PRN
  Administered 2023-08-07: 2 mg via INTRAVENOUS

## 2023-08-07 MED ORDER — FENTANYL CITRATE PF 50 MCG/ML IJ SOSY
25.0000 ug | PREFILLED_SYRINGE | INTRAMUSCULAR | Status: DC | PRN
  Administered 2023-08-07: 50 ug via INTRAVENOUS

## 2023-08-07 MED ORDER — PHENYLEPHRINE 80 MCG/ML (10ML) SYRINGE FOR IV PUSH (FOR BLOOD PRESSURE SUPPORT)
PREFILLED_SYRINGE | INTRAVENOUS | Status: AC
  Filled 2023-08-07: qty 10

## 2023-08-07 MED ORDER — ONDANSETRON HCL 4 MG/2ML IJ SOLN: 4.0000 mg | Freq: Once | INTRAMUSCULAR | Status: DC | PRN

## 2023-08-07 MED ORDER — ACETAMINOPHEN 325 MG PO TABS: 325.0000 mg | ORAL_TABLET | ORAL | Status: DC | PRN

## 2023-08-07 MED ORDER — ORAL CARE MOUTH RINSE: 15.0000 mL | Freq: Once | OROMUCOSAL | Status: AC

## 2023-08-07 MED ORDER — CHLORHEXIDINE GLUCONATE 0.12 % MT SOLN
15.0000 mL | Freq: Once | OROMUCOSAL | Status: AC
  Administered 2023-08-07: 15 mL via OROMUCOSAL

## 2023-08-07 MED ORDER — CELECOXIB 200 MG PO CAPS: 400.0000 mg | ORAL_CAPSULE | ORAL | Status: DC

## 2023-08-07 MED ORDER — OXYCODONE HCL 5 MG PO TABS
ORAL_TABLET | ORAL | Status: AC
  Administered 2023-08-07: 5 mg via ORAL
  Filled 2023-08-07: qty 1

## 2023-08-07 MED ORDER — FENTANYL CITRATE (PF) 100 MCG/2ML IJ SOLN
INTRAMUSCULAR | Status: DC | PRN
Start: 2023-08-07 — End: 2023-08-07
  Administered 2023-08-07: 50 ug via INTRAVENOUS

## 2023-08-07 MED ORDER — IBUPROFEN 200 MG PO TABS
800.0000 mg | ORAL_TABLET | Freq: Three times a day (TID) | ORAL | Status: DC | PRN
Start: 2023-08-07 — End: 2023-08-08
  Administered 2023-08-07: 800 mg via ORAL
  Filled 2023-08-07: qty 4

## 2023-08-07 MED ORDER — DEXAMETHASONE SODIUM PHOSPHATE 10 MG/ML IJ SOLN
INTRAMUSCULAR | Status: AC
  Filled 2023-08-07: qty 1

## 2023-08-07 MED ORDER — PROPOFOL 10 MG/ML IV BOLUS
INTRAVENOUS | Status: DC | PRN
  Administered 2023-08-07: 150 mg via INTRAVENOUS

## 2023-08-07 MED ORDER — ACETAMINOPHEN 500 MG PO TABS
1000.0000 mg | ORAL_TABLET | Freq: Once | ORAL | Status: AC
  Administered 2023-08-07: 1000 mg via ORAL
  Filled 2023-08-07: qty 2

## 2023-08-07 MED ORDER — CEFAZOLIN SODIUM-DEXTROSE 2-4 GM/100ML-% IV SOLN
2.0000 g | INTRAVENOUS | Status: AC
  Administered 2023-08-07: 2 g via INTRAVENOUS
  Filled 2023-08-07: qty 100

## 2023-08-07 MED ORDER — DEXMEDETOMIDINE HCL IN NACL 80 MCG/20ML IV SOLN
INTRAVENOUS | Status: AC
  Filled 2023-08-07: qty 20

## 2023-08-07 MED ORDER — CHLORPROMAZINE HCL 25 MG PO TABS
25.0000 mg | ORAL_TABLET | Freq: Three times a day (TID) | ORAL | Status: DC | PRN
  Administered 2023-08-08: 25 mg via ORAL
  Filled 2023-08-07 (×2): qty 1

## 2023-08-07 MED ORDER — LIDOCAINE HCL (PF) 2 % IJ SOLN
INTRAMUSCULAR | Status: DC | PRN
  Administered 2023-08-07: 100 mg via INTRADERMAL

## 2023-08-07 MED ORDER — 0.9 % SODIUM CHLORIDE (POUR BTL) OPTIME
TOPICAL | Status: DC | PRN
  Administered 2023-08-07: 1000 mL

## 2023-08-07 MED ORDER — ONDANSETRON HCL 4 MG/2ML IJ SOLN
INTRAMUSCULAR | Status: AC
  Filled 2023-08-07: qty 2

## 2023-08-07 MED ORDER — GABAPENTIN 300 MG PO CAPS
600.0000 mg | ORAL_CAPSULE | Freq: Three times a day (TID) | ORAL | Status: DC
  Administered 2023-08-07 – 2023-08-08 (×3): 600 mg via ORAL
  Filled 2023-08-07 (×3): qty 2

## 2023-08-07 MED ORDER — ROCURONIUM BROMIDE 10 MG/ML (PF) SYRINGE
PREFILLED_SYRINGE | INTRAVENOUS | Status: DC | PRN
  Administered 2023-08-07: 60 mg via INTRAVENOUS

## 2023-08-07 MED ORDER — FENTANYL CITRATE PF 50 MCG/ML IJ SOSY
PREFILLED_SYRINGE | INTRAMUSCULAR | Status: AC
  Administered 2023-08-07: 50 ug via INTRAVENOUS
  Filled 2023-08-07: qty 1

## 2023-08-07 MED ORDER — CELECOXIB 200 MG PO CAPS
200.0000 mg | ORAL_CAPSULE | Freq: Once | ORAL | Status: AC
  Administered 2023-08-07: 200 mg via ORAL
  Filled 2023-08-07: qty 1

## 2023-08-07 MED ORDER — OXYCODONE HCL 5 MG/5ML PO SOLN: 5.0000 mg | Freq: Once | ORAL | Status: AC | PRN

## 2023-08-07 MED ORDER — METHOCARBAMOL 500 MG PO TABS
500.0000 mg | ORAL_TABLET | Freq: Three times a day (TID) | ORAL | Status: DC | PRN
  Administered 2023-08-07 – 2023-08-08 (×2): 500 mg via ORAL
  Filled 2023-08-07 (×2): qty 1

## 2023-08-07 MED ORDER — CHLORHEXIDINE GLUCONATE CLOTH 2 % EX PADS: 6.0000 | MEDICATED_PAD | Freq: Once | CUTANEOUS | Status: DC

## 2023-08-07 MED ORDER — FAMOTIDINE 20 MG PO TABS
20.0000 mg | ORAL_TABLET | Freq: Two times a day (BID) | ORAL | Status: DC
  Administered 2023-08-07: 20 mg via ORAL
  Filled 2023-08-07 (×2): qty 1

## 2023-08-07 MED ORDER — ATORVASTATIN CALCIUM 20 MG PO TABS
20.0000 mg | ORAL_TABLET | Freq: Every day | ORAL | Status: DC
  Administered 2023-08-07 – 2023-08-08 (×2): 20 mg via ORAL
  Filled 2023-08-07 (×2): qty 1

## 2023-08-07 MED ORDER — MIDAZOLAM HCL 2 MG/2ML IJ SOLN
INTRAMUSCULAR | Status: AC
  Filled 2023-08-07: qty 2

## 2023-08-07 MED ORDER — BUPIVACAINE-EPINEPHRINE 0.25% -1:200000 IJ SOLN
INTRAMUSCULAR | Status: DC | PRN
  Administered 2023-08-07: 30 mL

## 2023-08-07 MED ORDER — ALBUMIN HUMAN 5 % IV SOLN
INTRAVENOUS | Status: AC
  Filled 2023-08-07: qty 250

## 2023-08-07 MED ORDER — PANTOPRAZOLE SODIUM 20 MG PO TBEC
20.0000 mg | DELAYED_RELEASE_TABLET | Freq: Every day | ORAL | Status: DC
  Administered 2023-08-07 – 2023-08-08 (×2): 20 mg via ORAL
  Filled 2023-08-07 (×2): qty 1

## 2023-08-07 MED ORDER — PROPOFOL 10 MG/ML IV BOLUS
INTRAVENOUS | Status: AC
  Filled 2023-08-07: qty 20

## 2023-08-07 MED ORDER — ALBUMIN HUMAN 5 % IV SOLN: INTRAVENOUS | Status: DC | PRN

## 2023-08-07 MED ORDER — FENTANYL CITRATE PF 50 MCG/ML IJ SOSY
PREFILLED_SYRINGE | INTRAMUSCULAR | Status: AC
Start: 2023-08-07 — End: 2023-08-07
  Administered 2023-08-07: 50 ug via INTRAVENOUS
  Filled 2023-08-07: qty 2

## 2023-08-07 MED ORDER — LIDOCAINE HCL (PF) 2 % IJ SOLN
INTRAMUSCULAR | Status: AC
  Filled 2023-08-07: qty 5

## 2023-08-07 MED ORDER — ONDANSETRON HCL 4 MG/2ML IJ SOLN
INTRAMUSCULAR | Status: DC | PRN
  Administered 2023-08-07: 4 mg via INTRAVENOUS

## 2023-08-07 MED ORDER — SUGAMMADEX SODIUM 200 MG/2ML IV SOLN
INTRAVENOUS | Status: AC
  Filled 2023-08-07: qty 2

## 2023-08-07 MED ORDER — OXYCODONE HCL 5 MG PO TABS: 5.0000 mg | ORAL_TABLET | Freq: Once | ORAL | Status: AC | PRN

## 2023-08-07 MED ORDER — ROCURONIUM BROMIDE 10 MG/ML (PF) SYRINGE
PREFILLED_SYRINGE | INTRAVENOUS | Status: AC
  Filled 2023-08-07: qty 10

## 2023-08-07 MED ORDER — ACETAMINOPHEN 500 MG PO TABS: 1000.0000 mg | ORAL_TABLET | ORAL | Status: DC

## 2023-08-07 MED ORDER — BUPIVACAINE-EPINEPHRINE (PF) 0.25% -1:200000 IJ SOLN
INTRAMUSCULAR | Status: AC
  Filled 2023-08-07: qty 30

## 2023-08-07 MED ORDER — ACETAMINOPHEN 160 MG/5ML PO SOLN: 325.0000 mg | ORAL | Status: DC | PRN

## 2023-08-07 MED ORDER — ALLOPURINOL 100 MG PO TABS
100.0000 mg | ORAL_TABLET | Freq: Two times a day (BID) | ORAL | Status: DC
Start: 2023-08-07 — End: 2023-08-07

## 2023-08-07 SURGICAL SUPPLY — 35 items
BAG COUNTER SPONGE SURGICOUNT (BAG) IMPLANT
BENZOIN TINCTURE PRP APPL 2/3 (GAUZE/BANDAGES/DRESSINGS) IMPLANT
CHLORAPREP W/TINT 26 (MISCELLANEOUS) ×1 IMPLANT
COVER SURGICAL LIGHT HANDLE (MISCELLANEOUS) ×1 IMPLANT
DERMABOND ADVANCED .7 DNX12 (GAUZE/BANDAGES/DRESSINGS) IMPLANT
DRAIN CHANNEL 19F RND (DRAIN) IMPLANT
DRAPE LAPAROSCOPIC ABDOMINAL (DRAPES) ×1 IMPLANT
DRSG TELFA 4X10 ISLAND STR (GAUZE/BANDAGES/DRESSINGS) IMPLANT
DRSG TELFA 4X8 ISLAND (GAUZE/BANDAGES/DRESSINGS) IMPLANT
ELECT REM PT RETURN 15FT ADLT (MISCELLANEOUS) ×1 IMPLANT
EVACUATOR SILICONE 100CC (DRAIN) IMPLANT
GLOVE BIOGEL PI IND STRL 7.0 (GLOVE) ×1 IMPLANT
GLOVE SURG SS PI 7.0 STRL IVOR (GLOVE) ×1 IMPLANT
GOWN STRL REUS W/ TWL LRG LVL3 (GOWN DISPOSABLE) ×1 IMPLANT
GOWN STRL REUS W/ TWL XL LVL3 (GOWN DISPOSABLE) IMPLANT
KIT BASIN OR (CUSTOM PROCEDURE TRAY) ×1 IMPLANT
KIT TURNOVER KIT A (KITS) IMPLANT
MARKER SKIN DUAL TIP RULER LAB (MISCELLANEOUS) IMPLANT
MESH BARD SOFT 6X6IN (Mesh General) IMPLANT
NDL HYPO 22X1.5 SAFETY MO (MISCELLANEOUS) ×1 IMPLANT
NEEDLE HYPO 22X1.5 SAFETY MO (MISCELLANEOUS) ×1 IMPLANT
PACK GENERAL/GYN (CUSTOM PROCEDURE TRAY) ×1 IMPLANT
SPIKE FLUID TRANSFER (MISCELLANEOUS) ×1 IMPLANT
SPONGE DRAIN TRACH 4X4 STRL 2S (GAUZE/BANDAGES/DRESSINGS) IMPLANT
STRIP CLOSURE SKIN 1/2X4 (GAUZE/BANDAGES/DRESSINGS) IMPLANT
SUT ETHILON 2 0 PS N (SUTURE) IMPLANT
SUT MNCRL AB 4-0 PS2 18 (SUTURE) ×1 IMPLANT
SUT NOVA 0 T19/GS 22DT (SUTURE) IMPLANT
SUT NOVA NAB GS-21 0 18 T12 DT (SUTURE) IMPLANT
SUT PDS AB 0 CT1 36 (SUTURE) ×2 IMPLANT
SUT VIC AB 2-0 CT1 TAPERPNT 27 (SUTURE) IMPLANT
SUT VIC AB 3-0 SH 18 (SUTURE) ×1 IMPLANT
SUT VIC AB 3-0 SH 27XBRD (SUTURE) IMPLANT
SYR 20ML LL LF (SYRINGE) ×1 IMPLANT
TOWEL OR 17X26 10 PK STRL BLUE (TOWEL DISPOSABLE) ×1 IMPLANT

## 2023-08-07 NOTE — Transfer of Care (Signed)
 Immediate Anesthesia Transfer of Care Note  Patient: [name]  Procedure(s) Performed: REPAIR, HERNIA, INCISIONAL  Patient Location: PACU  Anesthesia Type:General  Level of Consciousness: awake  Airway & Oxygen Therapy: Patient Spontanous Breathing and Patient connected to face mask oxygen  Post-op Assessment: Report given to RN and Post -op Vital signs reviewed and stable  Post vital signs: Reviewed and stable  Last Vitals:  Vitals Value Taken Time  BP 121/88 08/07/23 1146  Temp 36.4 C 08/07/23 1146  Pulse 73 08/07/23 1148  Resp 27 08/07/23 1148  SpO2 100 % 08/07/23 1148  Vitals shown include unfiled device data.  Last Pain:  Vitals:   08/07/23 0952  TempSrc:   PainSc: 0-No pain         Complications: No notable events documented.

## 2023-08-07 NOTE — Plan of Care (Signed)

## 2023-08-07 NOTE — Anesthesia Procedure Notes (Signed)
 Procedure Name: Intubation Date/Time: 08/07/2023 10:34 AM  Performed by: Florene Route, CRNAPre-anesthesia Checklist: Patient identified, Emergency Drugs available, Suction available and Patient being monitored Patient Re-evaluated:Patient Re-evaluated prior to induction Oxygen Delivery Method: Circle system utilized Preoxygenation: Pre-oxygenation with 100% oxygen Induction Type: IV induction Ventilation: Mask ventilation without difficulty Laryngoscope Size: Miller and 3 Grade View: Grade I Tube type: Oral Tube size: 8.0 mm Number of attempts: 1 Airway Equipment and Method: Stylet and Oral airway Placement Confirmation: ETT inserted through vocal cords under direct vision, positive ETCO2 and breath sounds checked- equal and bilateral Secured at: 23 cm Tube secured with: Tape Dental Injury: Teeth and Oropharynx as per pre-operative assessment

## 2023-08-07 NOTE — Anesthesia Postprocedure Evaluation (Signed)
 Anesthesia Post Note  Patient: [name]  Procedure(s) Performed: REPAIR, HERNIA, INCISIONAL     Patient location during evaluation: PACU Anesthesia Type: General Level of consciousness: awake and alert Pain management: pain level controlled Vital Signs Assessment: post-procedure vital signs reviewed and stable Respiratory status: spontaneous breathing, nonlabored ventilation, respiratory function stable and patient connected to nasal cannula oxygen Cardiovascular status: blood pressure returned to baseline and stable Postop Assessment: no apparent nausea or vomiting Anesthetic complications: no   No notable events documented.  Last Vitals:  Vitals:   08/07/23 1146 08/07/23 1157  BP: 121/88   Pulse: 81 77  Resp: (!) 26 (!) 23  Temp: 36.4 C   SpO2: 100% 95%    Last Pain:  Vitals:   08/07/23 1157  TempSrc:   PainSc: 7                  Meyah Corle

## 2023-08-07 NOTE — Progress Notes (Signed)
 Pre Procedure note for inpatients:   [name] has been scheduled for Procedure(s): REPAIR, HERNIA, INCISIONAL (N/A) today. The various methods of treatment have been discussed with the patient. After consideration of the risks, benefits and treatment options the patient has consented to the planned procedure.   The patient has been seen and labs reviewed. There are no changes in the patient's condition to prevent proceeding with the planned procedure today.  Recent labs:  Lab Results  Component Value Date   WBC 7.3 07/15/2023   HGB 13.9 07/15/2023   HCT 42.6 07/15/2023   PLT 302 07/15/2023   GLUCOSE 97 07/15/2023   CHOL 188 08/14/2022   TRIG 140 08/14/2022   HDL 54 08/14/2022   LDLCALC 109 (H) 08/14/2022   ALT 12 07/15/2023   AST 13 (L) 07/15/2023   NA 137 07/15/2023   K 3.9 07/15/2023   CL 104 07/15/2023   CREATININE 1.16 07/15/2023   BUN 18 07/15/2023   CO2 26 07/15/2023   INR 1.10 11/28/2012    Rodman Pickle, MD 08/07/2023 10:21 AM

## 2023-08-07 NOTE — Plan of Care (Signed)

## 2023-08-07 NOTE — Anesthesia Preprocedure Evaluation (Addendum)
 Anesthesia Evaluation  Patient identified by MRN, date of birth, ID band Patient awake    Reviewed: Allergy & Precautions, H&P , NPO status , Patient's Chart, lab work & pertinent test results  Airway Mallampati: II  TM Distance: >3 FB Neck ROM: Full    Dental no notable dental hx. (+) Poor Dentition, Chipped, Missing, Dental Advisory Given   Pulmonary neg pulmonary ROS, Current Smoker and Patient abstained from smoking.   Pulmonary exam normal breath sounds clear to auscultation       Cardiovascular Exercise Tolerance: Good negative cardio ROS Normal cardiovascular exam Rhythm:Regular Rate:Normal     Neuro/Psych negative neurological ROS  negative psych ROS   GI/Hepatic ,GERD  Medicated,,(+)     substance abuse  alcohol use, cocaine use, marijuana use and methamphetamine use  Endo/Other  negative endocrine ROS    Renal/GU Renal disease  negative genitourinary   Musculoskeletal negative musculoskeletal ROS (+)    Abdominal   Peds negative pediatric ROS (+)  Hematology  (+) Blood dyscrasia, anemia   Anesthesia Other Findings   Reproductive/Obstetrics negative OB ROS                             Anesthesia Physical Anesthesia Plan  ASA: 3  Anesthesia Plan: General   Post-op Pain Management: Tylenol PO (pre-op)* and Celebrex PO (pre-op)*   Induction: Intravenous  PONV Risk Score and Plan: 2 and Ondansetron, Dexamethasone and Treatment may vary due to age or medical condition  Airway Management Planned: Oral ETT  Additional Equipment: None  Intra-op Plan:   Post-operative Plan: Extubation in OR  Informed Consent: I have reviewed the patients History and Physical, chart, labs and discussed the procedure including the risks, benefits and alternatives for the proposed anesthesia with the patient or authorized representative who has indicated his/her understanding and acceptance.        Plan Discussed with: Anesthesiologist and CRNA  Anesthesia Plan Comments: (  )       Anesthesia Quick Evaluation

## 2023-08-07 NOTE — Op Note (Signed)
 PATIENT:  [name]  58 y.o. male  PRE-OPERATIVE DIAGNOSIS:  INCISIONAL HERNIA  POST-OPERATIVE DIAGNOSIS:  INCISIONAL HERNIA  PROCEDURE:  Procedure(s): REPAIR, 3.5 cm HERNIA, INCISIONAL, with mesh   SURGEON:  Surgeon(s): Love Chowning, De Blanch, MD  ASSISTANT: none   ANESTHESIA:   local and general  Indications for procedure: [name] is a 58 y.o. year old male with symptoms of abdominal pain and focal distension. He presents for hernia repair.  Description of procedure: The patient was brought into the operative suite. Anesthesia was administered with General endotracheal anesthesia. WHO checklist was applied. The patient was then placed in supine. The area was prepped and draped in the usual sterile fashion.  Next the epigastric midline insicision was made Cautery and blunt dissection was used to dissect down to the fascia. The hernia sac was dissected free from surrounding tissues in 360 degrees. The hernia was in 2 parts 1 2 cm and 1 1 cm just inferior left lateral of the first for a total size of 3.5 cm. The hernia contained omentum and was reduced. The peritoneum was freed from the posterior fascia about 4 cm in all directions. 1 area of tear was repaired with 3-0 vicryl. Due to the size of the hernia, a 12 x 12 cm Bard mesh was inserted into the preperitoneal space. Care was taken to lay the mesh flat. The mesh was sutured in place in 4 quadrants with 0 NovafilThe fascial defect was then primarily closed with interrupted 0 PDS sutures. The deep dermal space was closed with a 3-0 vicryl. Marcaine was injected into the muscle layer and around the fascia. The skin was closed with a 4-0 monocryl subcuticular suture. Dermabond was put in place for dressing. The patient awoke from anesthesia and was brought to pacu in stable condition. All counts were correct.  Findings: 3.5 cm fascial defect  Specimen: none  Blood loss: 20 ml  Local anesthesia: 30 ml  Marcaine  Complications: none  PLAN OF CARE: Admit for overnight observation  PATIENT DISPOSITION:  PACU - hemodynamically stable.  Feliciana Rossetti, M.D. General, Bariatric, & Minimally Invasive Surgery [facility], [place]  08/07/2023 11:36 AM

## 2023-08-08 ENCOUNTER — Encounter (HOSPITAL_COMMUNITY): Payer: Self-pay | Admitting: General Surgery

## 2023-08-08 DIAGNOSIS — K432 Incisional hernia without obstruction or gangrene: Secondary | ICD-10-CM | POA: Diagnosis not present

## 2023-08-08 MED ORDER — IBUPROFEN 800 MG PO TABS
800.0000 mg | ORAL_TABLET | Freq: Three times a day (TID) | ORAL | 0 refills | Status: DC | PRN
Start: 2023-08-08 — End: 2024-01-21

## 2023-08-08 MED ORDER — METHOCARBAMOL 500 MG PO TABS: 500.0000 mg | ORAL_TABLET | Freq: Three times a day (TID) | ORAL | 0 refills | Status: DC | PRN

## 2023-08-08 MED ORDER — OXYCODONE HCL 5 MG PO TABS: 5.0000 mg | ORAL_TABLET | Freq: Four times a day (QID) | ORAL | 0 refills | Status: DC | PRN

## 2023-08-08 NOTE — Discharge Summary (Signed)
 Physician Discharge Summary  [name] BOF:[id number] DOB: 08-25-1965 DOA: 08/06/2023  PCP: Claiborne Rigg, NP  Admit date: 08/06/2023 Discharge date:  08/08/2023   Recommendations for Outpatient Follow-up:   (include homehealth, outpatient follow-up instructions, specific recommendations for PCP to follow-up on, etc.)   Follow-up Information     Destanie Tibbetts, De Blanch, MD Follow up on 09/06/2023.   Specialty: General Surgery Contact information: [address] [place] [address] [phone]                Discharge Diagnoses:  Principal Problem:   Incisional hernia   Surgical Procedure: open incisional hernia repair with mesh  Discharge Condition: Good Disposition: Home  Diet recommendation: reg diet   Hospital Course:  58 yo male initially had surgery for 3/10. She presented after eating within 8 h and was observed overnight for surgery 3/11. After surgery he was kept overnight due to social difficulty and discharged home POD 1. He had moderate left sided pain at discharge.  Discharge Instructions  Discharge Instructions     Diet - low sodium heart healthy   Complete by: As directed    Discharge wound care:   Complete by: As directed    Shower normal tomorrow. Glue to stay on for 10-14 days. No bandage needed.   Increase activity slowly   Complete by: As directed       Allergies as of 08/08/2023       Reactions   Tomato         Medication List     STOP taking these medications    methylPREDNISolone 4 MG Tbpk tablet Commonly known as: MEDROL DOSEPAK   oxyCODONE-acetaminophen 5-325 MG tablet Commonly known as: PERCOCET/ROXICET   predniSONE 20 MG tablet Commonly known as: DELTASONE       TAKE these medications    allopurinol 100 MG tablet Commonly known as: ZYLOPRIM Take 1 tablet (100 mg total) by mouth 2 (two) times daily.   atorvastatin 20 MG tablet Commonly known as: LIPITOR Take 1 tablet (20 mg  total) by mouth daily.   chlorproMAZINE 25 MG tablet Commonly known as: THORAZINE Take 1 tablet (25 mg total) by mouth 3 (three) times daily as needed for hiccoughs.   Colchicine 0.6 MG Caps Take 1 capsule (0.6 mg total) by mouth daily.   famotidine 20 MG tablet Commonly known as: PEPCID Take 1 tablet (20 mg total) by mouth 2 (two) times daily.   gabapentin 600 MG tablet Commonly known as: Neurontin Take 1 tablet (600 mg total) by mouth 3 (three) times daily.   ibuprofen 800 MG tablet Commonly known as: ADVIL Take 1 tablet (800 mg total) by mouth every 8 (eight) hours as needed.   indomethacin 25 MG capsule Commonly known as: INDOCIN Take 1 capsule (25 mg total) by mouth 3 (three) times daily as needed for gout flare   methocarbamol 500 MG tablet Commonly known as: ROBAXIN Take 1 tablet (500 mg total) by mouth every 8 (eight) hours as needed for up to 30 doses for muscle spasms.   nitrofurantoin (macrocrystal-monohydrate) 100 MG capsule Commonly known as: MACROBID Take 1 capsule (100 mg total) by mouth 2 (two) times daily.   ondansetron 4 MG disintegrating tablet Commonly known as: ZOFRAN-ODT Dissolve 1 tablet (4 mg total) in mouth every 8 (eight) hours as needed.   oxyCODONE 5 MG immediate release tablet Commonly known as: Oxy IR/ROXICODONE Take 1 tablet (5 mg total) by mouth every 6 (six) hours  as needed for severe pain (pain score 7-10).   pantoprazole 20 MG tablet Commonly known as: PROTONIX Take 1 tablet (20 mg total) by mouth daily.               Discharge Care Instructions  (From admission, onward)           Start     Ordered   08/08/23 0000  Discharge wound care:       Comments: Shower normal tomorrow. Glue to stay on for 10-14 days. No bandage needed.   08/08/23 8469            Follow-up Information     Elan Brainerd, De Blanch, MD Follow up on 09/06/2023.   Specialty: General Surgery Contact information: [address] [place] [address] [phone]                  The results of significant diagnostics from this hospitalization (including imaging, microbiology, ancillary and laboratory) are listed below for reference.    Significant Diagnostic Studies: CT ABDOMEN PELVIS W CONTRAST Result Date: 07/12/2023 CLINICAL DATA:  Acute abdominal pain EXAM: CT ABDOMEN AND PELVIS WITH CONTRAST TECHNIQUE: Multidetector CT imaging of the abdomen and pelvis was performed using the standard protocol following bolus administration of intravenous contrast. RADIATION DOSE REDUCTION: This exam was performed according to the departmental dose-optimization program which includes automated exposure control, adjustment of the mA and/or kV according to patient size and/or use of iterative reconstruction technique. CONTRAST:  75mL OMNIPAQUE IOHEXOL 350 MG/ML SOLN COMPARISON:  08/15/2021 FINDINGS: Lower chest: Shrapnel within the left paraspinal region at the T10 level unchanged. Chronic scarring at the left lung base. No acute pleural or parenchymal lung disease. There is wall thickening of the distal thoracic esophagus which could reflect esophagitis. Hepatobiliary: No focal liver abnormality is seen. No gallstones, gallbladder wall thickening, or biliary dilatation. Pancreas: Unremarkable. No pancreatic ductal dilatation or surrounding inflammatory changes. Spleen: Normal in size without focal abnormality. Adrenals/Urinary Tract: Stable malrotation of the left kidney. The kidneys enhance normally. The adrenals are stable. Bladder is unremarkable. Stomach/Bowel: No bowel obstruction or ileus. Normal appendix right lower quadrant. Colonic diverticulosis without evidence of acute diverticulitis. No bowel wall thickening or inflammatory change. Vascular/Lymphatic: No significant vascular findings are present. No enlarged abdominal or pelvic lymph nodes. Reproductive: Prostate is unremarkable. Other: No free fluid or free  intraperitoneal gas. Small fat containing supraumbilical midline ventral hernia unchanged. No bowel herniation. Musculoskeletal: Stable postsurgical changes of the right hip and right hemipelvis. No acute or destructive bony abnormalities. Reconstructed images demonstrate no additional findings. IMPRESSION: 1. Mural thickening of the distal thoracic esophagus, which could reflect esophagitis. Follow-up esophagram or endoscopy may be useful. 2. Diffuse colonic diverticulosis without diverticulitis. 3. Small fat containing midline supraumbilical ventral hernia, stable. Electronically Signed   By: Sharlet Salina M.D.   On: 07/12/2023 19:45   DG Foot Complete Right Result Date: 07/11/2023 Please see detailed radiograph report in office note.  DG Foot Complete Left Result Date: 07/11/2023 Please see detailed radiograph report in office note.   Labs: Basic Metabolic Panel: No results for input(s): "NA", "K", "CL", "CO2", "GLUCOSE", "BUN", "CREATININE", "CALCIUM", "MG", "PHOS" in the last 168 hours. Liver Function Tests: No results for input(s): "AST", "ALT", "ALKPHOS", "BILITOT", "PROT", "ALBUMIN" in the last 168 hours.  CBC: No results for input(s): "WBC", "NEUTROABS", "HGB", "HCT", "MCV", "PLT" in the last 168 hours.  CBG: No results for input(s): "GLUCAP" in the last 168 hours.  Principal Problem:   Incisional hernia   Time coordinating discharge: 15 min

## 2023-08-08 NOTE — Discharge Instructions (Signed)

## 2023-08-08 NOTE — Progress Notes (Signed)
   08/08/23 0857  TOC Brief Assessment  Insurance and Status Reviewed  Patient has primary care physician Yes  Home environment has been reviewed home w/ family  Prior level of function: independent  Prior/Current Home Services No current home services  Social Drivers of Health Review SDOH reviewed no interventions necessary  Readmission risk has been reviewed Yes  Transition of care needs no transition of care needs at this time   Resources added to AVS

## 2023-08-10 ENCOUNTER — Encounter: Payer: Medicaid Other | Admitting: Nurse Practitioner
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
## 2023-08-23 ENCOUNTER — Encounter: Payer: Self-pay | Admitting: Physician Assistant

## 2023-08-23 ENCOUNTER — Other Ambulatory Visit (HOSPITAL_COMMUNITY)
Admission: RE | Admit: 2023-08-23 | Discharge: 2023-08-23 | Disposition: A | Discharge status: Home / Self Care | Source: Ambulatory Visit | Attending: Physician Assistant | Admitting: Physician Assistant

## 2023-08-23 ENCOUNTER — Ambulatory Visit: Attending: Physician Assistant | Admitting: Physician Assistant

## 2023-08-23 VITALS — BP 138/93 | HR 98 | Resp 18 | Ht 67.0 in | Wt 148.0 lb

## 2023-08-23 DIAGNOSIS — S301XXA Contusion of abdominal wall, initial encounter: Secondary | ICD-10-CM

## 2023-08-23 DIAGNOSIS — S301XXD Contusion of abdominal wall, subsequent encounter: Secondary | ICD-10-CM

## 2023-08-23 DIAGNOSIS — R300 Dysuria: Secondary | ICD-10-CM | POA: Insufficient documentation

## 2023-08-23 DIAGNOSIS — K432 Incisional hernia without obstruction or gangrene: Secondary | ICD-10-CM

## 2023-08-23 DIAGNOSIS — Z09 Encounter for follow-up examination after completed treatment for conditions other than malignant neoplasm: Secondary | ICD-10-CM | POA: Diagnosis not present

## 2023-08-23 LAB — POCT URINALYSIS DIP (CLINITEK)
Bilirubin, UA: NEGATIVE
Glucose, UA: NEGATIVE mg/dL
Ketones, POC UA: NEGATIVE mg/dL
Nitrite, UA: NEGATIVE
POC PROTEIN,UA: NEGATIVE
Spec Grav, UA: >=1.03 — AB (ref 1.010–1.025)
Urobilinogen, UA: 0.2 U/dL
pH, UA: 6 (ref 5.0–8.0)
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
## 2023-08-23 NOTE — Patient Instructions (Addendum)
 Please call for your surgery follow up appt asap:  [facility] [address] [place], [place] [address] [phone]  Seroma A seroma is a collection of fluid on the body that looks like swelling or a mass. Seromas form where tissue has been injured or cut. Seromas vary in size. Some are small and painless. Others may grow large and cause pain or discomfort. Many seromas go away on their own as the fluid is naturally absorbed by the body, and some seromas need to be drained. What are the causes? Seromas form because tissue has been damaged or removed. This tissue damage may happen during surgery or because of an injury or trauma. When tissue is damaged or removed, empty space is created. The body's defense system (immune system) causes fluid to enter the empty space and form a seroma. What are the signs or symptoms? Symptoms of this condition include: Swelling at the site of a surgical incision or an injury. Drainage of clear fluid at the surgery or injury site. Discomfort or pain. How is this diagnosed? This condition is diagnosed based on: Your symptoms. Your medical history. A physical exam. During the exam, your health care provider will press on the seroma. You may also have tests, such as: Blood tests. An ultrasound, a CT scan, or other imaging tests. How is this treated? Some seromas go away on their own. Your health care provider may watch the seroma to make sure it does not cause any problems. Seromas that do not go away on their own may be treated. Treatment may include: Using a needle to drain the fluid from the seroma (needle aspiration). Putting in a small, thin tube (catheter) to drain the fluid. Putting on a bandage (dressing), such as an elastic bandage or binder. Taking antibiotics, if the seroma gets infected. In rare cases, surgery may be done to remove the seroma and repair the area. Follow these instructions at home:  If you  were prescribed antibiotics, take them as told by your health care provider. Do not stop using the antibiotic even if you start to feel better. Take over-the-counter and prescription medicines only as told by your health care provider. Return to your normal activities as told by your health care provider. Ask your health care provider what activities are safe for you. Check your seroma every day for signs of infection. Check for: Redness or pain. More swelling. More fluid. Warmth. Pus or a bad smell. Keep all follow-up visits. Your health care provider needs to check that your seroma is healing. Contact a health care provider if: You have a fever. You have redness or pain at the site of your seroma. Your seroma is more swollen or is getting bigger. You have more fluid coming from your seroma. Your seroma feels warm to the touch. You have pus or a bad smell coming from your seroma. Get help right away if: You have a fever along with severe pain, redness at the site, or chills. You feel confused or have trouble staying awake. You feel like your heart is beating very fast. You feel short of breath or are breathing fast. You have cool, clammy, or sweaty skin. Summary Seromas can form because of injury or surgery on tissue. Seromas can cause swelling, drainage of clear fluid, and discomfort or pain at the surgery or injury site. Some seromas go away on their own. Other seromas may need to be drained. Check your seroma every day for signs of  infection. Signs include redness, pain, more swelling, more fluid, warmth, pus, or a bad smell. This information is not intended to replace advice given to you by your health care provider. Make sure you discuss any questions you have with your health care provider. Document Revised: 08/01/2021 Document Reviewed: 08/01/2021 Elsevier Patient Education  2024 [employer].

## 2023-08-24 ENCOUNTER — Other Ambulatory Visit: Payer: Self-pay | Admitting: Physician Assistant

## 2023-08-24 DIAGNOSIS — A599 Trichomoniasis, unspecified: Secondary | ICD-10-CM

## 2023-08-24 LAB — URINE CYTOLOGY ANCILLARY ONLY
Chlamydia: NEGATIVE
Comment: NEGATIVE
Comment: NEGATIVE
Comment: NORMAL
Neisseria Gonorrhea: NEGATIVE
Trichomonas: POSITIVE — AB

## 2023-08-24 MED ORDER — METRONIDAZOLE 500 MG PO TABS: 500.0000 mg | ORAL_TABLET | Freq: Two times a day (BID) | ORAL | 0 refills | Status: DC

## 2023-08-28 NOTE — Addendum Note (Signed)
 Addended by: Arbie Cookey on: 08/28/2023 03:28 PM   Modules accepted: Orders

## 2023-08-29 ENCOUNTER — Encounter: Admitting: Nurse Practitioner

## 2023-09-19 ENCOUNTER — Encounter: Admitting: Nurse Practitioner

## 2023-10-09 ENCOUNTER — Encounter: Admitting: Nurse Practitioner

## 2024-01-18 ENCOUNTER — Ambulatory Visit: Payer: Self-pay | Admitting: *Deleted

## 2024-01-18 NOTE — Telephone Encounter (Signed)
 Copied from CRM #[id number]. Topic: Clinical - Red Word Triage >> Jan 18, 2024  8:29 AM [name] wrote: [facility] that prompted transfer to Nurse Triage: Received call mother, [name], DPR verified, calling on behalf of patient. Initially requesting to speak to office or provider with no details given of purpose of call. Attempted to contact office, no answer.  Informed of above, patient mother then expressed interest of speaking with triage regarding concerns with mental status of patient, and concerns of substance abuse. 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  He owes the drug dealer money.   Mother is requesting help for him.   Has an appt with Patrick Servant, NP for 01/21/2024.   Mother coming with pt to this appt.  Answer Assessment - Initial Assessment Questions 1. ALCOHOL  USE: Do you drink alcohol , including beer, wine or hard liquor?     Mother, [name] calling in, on [place].     He [place] the woods in a [place].   He gave me a letter when I checked on him yesterday.   He went to the hospital.   He is to be at the doctor's office for surgery on 01/21/2024 is what he told me but he is not for surgery.  I have the paper here.   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   So I know this is not correct.   This appt is for a check up.   He is wanting pain pills.  He is addicted to them and that's why he is coming in.   I don'Owens want him to be prescribed any pain pills.     He has an appt for 01/21/2024 with Patrick Servant, NP for a follow up from the hospital visit.   Not for surgery.   He is lying to me and I know that because I have the letter myself.   His mail comes to me since he lives in a tent. He went to the hospital for pain pills.   He is hooked on them.   I went with him to the hospital and they didn'Owens give him pain pills.    I want Patrick Servant NP to know not to prescribe pain pills for him   He is  addicted to them.   That's why he is coming in to see Patrick Servant, NP is for pain pills.   I've been trying to get [name] committed.   Can [name] have him committed?   They won'Owens let me have him committed.    I want to know if she can give him something to stop the craving for these pain pills.   He is addicted.    I don'Owens want him to be prescribed any more pain pills.    [name], mother will be coming with him to the appt. On 01/21/2024.  He owes the drug dealer money.   His paper checks he gets from the government will stop coming.   He must open a bank account in order to get his money from now on.    He is refusing to open a bank account.   So I don'Owens know what he is going to do since he can'Owens get paper checks any more.   He owes the drug dealer money which is why he won'Owens open a bank account.  His mail comes to my house since he lives in a tent.     He kids don'Owens have anything to do with him.   One of his kids is a Child psychotherapist and has tried to help him.   She lives in [place], [place] and has tried to help him.      He needs to be involuntary committed.  Patrick Servant, NP needs to have someone pick him up  from the office and have him committed.   He will not volunteer to go himself.    Please let [name] know about this for his appt on 01/21/2024.    I let Patrick know I would get this information to [name] for his upcoming appt. 2. HOW OFTEN: How many days per week do you typically drink alcohol ?     See above for details 3. HOW MUCH: How many drinks do you typically have on days when you drink? (1.5 oz hard liquor [one shot or jigger; 45 ml], 5 oz wine [small glass; 150 ml], 12 oz beer [one can; 360 ml])      4. MOST: What is the most that you have had to drink on any one occasion in the last month?      5. LAST 24 HOURS: Have you had a drink within the last 24 hours?      6. DRINKING PROBLEM: Do you have or have you ever had an alcohol  drinking problem?      7. DRUG PROBLEM: Are  you using any other drugs? (e.g., yes/no; cocaine , prescription medicines, etc.)      8. SYMPTOMS: What symptoms are you currently experiencing? (e.g., none, tremors, or shakiness, abdomen pain, vomiting, blackout spells)      9. TREATMENT PROGRAM: Have you ever gone through an alcohol  use treatment program?      10. THERAPIST: Do you have a counselor or therapist? If Yes, ask: What is their name?        11. SUPPORT: Who is with you now? Who do you live with? Do you have family or friends who you can talk to? Are you a member of Alcoholics Anonymous?        12. PREGNANCY: Is there any chance you are pregnant? When was your last menstrual period?       N/A  Protocols used: Alcohol  Use and Problems-A-AH Information for Patrick Servant, NP before this pt's upcoming appt on 01/21/2024.   His mother [name] is coming with him.   He is addicted to pain pills and she is seeking treatment for him.  Wants him involuntarily committed.   See triage notes.     FYI Only or Action Required?: Action required by provider: clinical question for provider and update on patient condition.  Patient was last seen in primary care on 08/23/2023 by Patrick Jon HERO, PA-C.  Called Nurse Triage reporting Mental Health Problem.  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 Addicted to pain pills per his mother.  He lives [place] a tent [place] the woods.    Interventions attempted: Other: mother wants him to be involuntarily committed for addiction to pain pills    See triage notes.  Symptoms are: gradually worsening.Mother seeking treatment and help for him.  Triage Disposition: See PCP Within 2 Weeks  Patient/caregiver understands and will follow disposition?: Yes

## 2024-01-21 ENCOUNTER — Ambulatory Visit: Attending: Nurse Practitioner | Admitting: Nurse Practitioner

## 2024-01-21 ENCOUNTER — Encounter: Payer: Self-pay | Admitting: Nurse Practitioner

## 2024-01-21 ENCOUNTER — Other Ambulatory Visit (HOSPITAL_COMMUNITY)
Admission: RE | Admit: 2024-01-21 | Discharge: 2024-01-21 | Disposition: A | Discharge status: Home / Self Care | Source: Ambulatory Visit | Attending: Nurse Practitioner | Admitting: Nurse Practitioner

## 2024-01-21 VITALS — BP 150/82 | HR 69 | Resp 19 | Ht 67.0 in | Wt 145.8 lb

## 2024-01-21 DIAGNOSIS — E7841 Elevated Lipoprotein(a): Secondary | ICD-10-CM

## 2024-01-21 DIAGNOSIS — K219 Gastro-esophageal reflux disease without esophagitis: Secondary | ICD-10-CM

## 2024-01-21 DIAGNOSIS — Z7251 High risk heterosexual behavior: Secondary | ICD-10-CM | POA: Insufficient documentation

## 2024-01-21 DIAGNOSIS — A599 Trichomoniasis, unspecified: Secondary | ICD-10-CM

## 2024-01-21 DIAGNOSIS — R030 Elevated blood-pressure reading, without diagnosis of hypertension: Secondary | ICD-10-CM | POA: Diagnosis not present

## 2024-01-21 DIAGNOSIS — M2550 Pain in unspecified joint: Secondary | ICD-10-CM | POA: Diagnosis not present

## 2024-01-21 MED ORDER — AMLODIPINE BESYLATE 5 MG PO TABS: 5.0000 mg | ORAL_TABLET | Freq: Every day | ORAL | 1 refills | Status: AC

## 2024-01-21 MED ORDER — ALLOPURINOL 300 MG PO TABS: 300.0000 mg | ORAL_TABLET | Freq: Every day | ORAL | 3 refills | Status: AC

## 2024-01-21 MED ORDER — ATORVASTATIN CALCIUM 20 MG PO TABS: 20.0000 mg | ORAL_TABLET | Freq: Every day | ORAL | 3 refills | Status: AC

## 2024-01-21 MED ORDER — PANTOPRAZOLE SODIUM 20 MG PO TBEC: 20.0000 mg | DELAYED_RELEASE_TABLET | Freq: Every day | ORAL | 1 refills | Status: AC
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
## 2024-01-21 NOTE — Progress Notes (Signed)
 Need pain medication. General body pain.  Needs urine cytology

## 2024-01-22 ENCOUNTER — Ambulatory Visit: Payer: Self-pay | Admitting: Nurse Practitioner

## 2024-01-22 LAB — CMP14+EGFR
ALT: 16 IU/L (ref 0–44)
AST: 21 IU/L (ref 0–40)
Albumin: 4.1 g/dL (ref 3.8–4.9)
Alkaline Phosphatase: 66 IU/L (ref 44–121)
BUN/Creatinine Ratio: 16 (ref 9–20)
BUN: 18 mg/dL (ref 6–24)
Bilirubin Total: 0.3 mg/dL (ref 0.0–1.2)
CO2: 25 mmol/L (ref 20–29)
Calcium: 9.5 mg/dL (ref 8.7–10.2)
Chloride: 100 mmol/L (ref 96–106)
Creatinine, Ser: 1.15 mg/dL (ref 0.76–1.27)
Globulin, Total: 2.8 g/dL (ref 1.5–4.5)
Glucose: 73 mg/dL (ref 70–99)
Potassium: 4.4 mmol/L (ref 3.5–5.2)
Sodium: 140 mmol/L (ref 134–144)
Total Protein: 6.9 g/dL (ref 6.0–8.5)
eGFR: 74 mL/min/1.73 (ref >59)

## 2024-01-22 LAB — URINE CYTOLOGY ANCILLARY ONLY
Chlamydia: NEGATIVE
Comment: NEGATIVE
Comment: NEGATIVE
Comment: NORMAL
Neisseria Gonorrhea: NEGATIVE
Trichomonas: NEGATIVE

## 2024-01-22 LAB — HIV ANTIBODY (ROUTINE TESTING W REFLEX): HIV Screen 4th Generation wRfx: NONREACTIVE

## 2024-01-22 LAB — ARTHRITIS PANEL
Anti Nuclear Antibody (ANA): NEGATIVE
Rheumatoid fact SerPl-aCnc: <10 [IU]/mL (ref <14.0)
Sed Rate: 3 mm/h (ref 0–30)
Uric Acid: 8.9 mg/dL — ABNORMAL HIGH (ref 3.8–8.4)

## 2024-02-04 ENCOUNTER — Encounter: Payer: Self-pay | Admitting: Nurse Practitioner

## 2024-05-08 ENCOUNTER — Telehealth: Payer: Self-pay | Admitting: Nurse Practitioner

## 2024-05-08 ENCOUNTER — Ambulatory Visit: Payer: Self-pay

## 2024-05-08 NOTE — Telephone Encounter (Signed)
 FYI Only or Action Required?: Action required by provider: request for appointment. Pt can be called at [phone]   Patient was last seen in primary care on 01/21/2024 by Theotis Haze ORN, NP.  Called Nurse Triage reporting Pain.  Symptoms began several days ago.  Interventions attempted: Rest, hydration, or home remedies.  Symptoms are: gradually worsening.  Triage Disposition: See HCP Within 4 Hours (Or PCP Triage)  Patient/caregiver understands and will follow disposition?: Yes     Copied from CRM #[id number]. Topic: Clinical - Red Word Triage >> May 08, 2024 10:06 AM [name] wrote: [name] that prompted transfer to Nurse Triage: Pt is experiencing pain over his whole body (back, legs, feet). Pt thinks he broke his L pinky finger Warm transfer to NT. Reason for Disposition  [1] SEVERE pain (e.g., excruciating, unable to do any normal activities) AND [2] not improved 2 hours after pain medicine  Answer Assessment - Initial Assessment Questions Spoke with pts mom [name] (on [place]) and the pt. Reporting generalized aching 8-9/10 pain in back and legs, left pinky and left foot. Onset 2 days ago. Has experienced this in the past, unsure what the cause was at that time. Has been doing heavy lifting recently. Hasn't drank anything in the past few days, and reports urine output is fine and not brown or red in color. Attempted to ask more questions about hydration, pt declines stating he just wants to make appt with provider. Moderate swelling in left hand and left foot. Denies CP or SOB or fever. No appts available at home office or surrounding region in next 4 hours. Advised UC. Pt wanting to schedule soonest appt with PCP on 06/18/24 to be evaluated for current issue. Advised to not wait that long. Notified pt this RN would send request to office to see if he can be worked in sooner and advised UC or ED for worsening symptoms. Pt states he soul like to be called back with the appt  time.   1. ONSET: When did the muscle aches or body pains start?      2 days ago 2. LOCATION: What part of your body is hurting? (e.g., entire body, arms, legs)      Back and legs, left pinky and left foot 3. SEVERITY: How bad is the pain? (Scale 1-10; or mild, moderate, severe)     8-9/10 ache 4. CAUSE: What do you think is causing the pains?     Heavy lifting recently 5. FEVER: Do you have a fever? If Yes, ask: What is your temperature, how was it measured, and  when did it start?      Denies 6. OTHER SYMPTOMS: Do you have any other symptoms? (e.g., chest pain, cold or flu symptoms, rash, weakness, weight loss)     Swelling in left hand and left foot moderate. No numbness or tingling or CP or SOB  Protocols used: Muscle Aches and Body Pain-A-AH

## 2024-05-08 NOTE — Telephone Encounter (Signed)
 Duplicate message. A different message has been sent to provider in regards to this.
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
## 2024-05-08 NOTE — Telephone Encounter (Signed)
 Routing to CMA

## 2024-05-10 NOTE — Telephone Encounter (Signed)
 Needs office visit to evaluate finger

## 2024-05-16 ENCOUNTER — Ambulatory Visit: Payer: Self-pay | Attending: *Deleted | Admitting: *Deleted

## 2024-05-16 VITALS — BP 112/78 | HR 105 | Temp 98.6°F | Ht 67.0 in | Wt 149.6 lb

## 2024-05-16 DIAGNOSIS — M79645 Pain in left finger(s): Secondary | ICD-10-CM

## 2024-05-16 DIAGNOSIS — G894 Chronic pain syndrome: Secondary | ICD-10-CM

## 2024-05-16 DIAGNOSIS — Z23 Encounter for immunization: Secondary | ICD-10-CM

## 2024-05-16 MED ORDER — KETOROLAC TROMETHAMINE 60 MG/2ML IM SOLN
60.0000 mg | Freq: Once | INTRAMUSCULAR | Status: AC
  Administered 2024-05-16: 60 mg via INTRAMUSCULAR

## 2024-05-16 NOTE — Patient Instructions (Signed)
 We discussed your left fifth finger pain status post left injury about 3 days ago. You have good range of motion in the finger though it may be out of joint. We discussed an x-ray but decided to splint it and treat with anti-inflammatories (Toradol  shot in the clinic and ibuprofen  800 times a day with food as needed. Use of heat or ice may comfort you as well If your pain persist or worsen we can certainly x-ray it or you can return to ER/urgent care for further attention
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
## 2024-05-21 ENCOUNTER — Other Ambulatory Visit: Payer: Self-pay | Admitting: Nurse Practitioner

## 2024-05-21 DIAGNOSIS — M79674 Pain in right toe(s): Secondary | ICD-10-CM

## 2024-05-21 DIAGNOSIS — G894 Chronic pain syndrome: Secondary | ICD-10-CM
